# Patient Record
Sex: Female | Born: 1965 | Race: Black or African American | Hispanic: No | Marital: Single | Smoking: Never smoker
Health system: Southern US, Community
[De-identification: ages and names within clinical notes are randomized; demographics above are authoritative.]

## PROBLEM LIST (undated history)

## (undated) DIAGNOSIS — I10 Essential (primary) hypertension: Secondary | ICD-10-CM

## (undated) DIAGNOSIS — D860 Sarcoidosis of lung: Secondary | ICD-10-CM

## (undated) DIAGNOSIS — R05 Cough: Secondary | ICD-10-CM

## (undated) DIAGNOSIS — E785 Hyperlipidemia, unspecified: Secondary | ICD-10-CM

## (undated) HISTORY — PX: TUBAL LIGATION: SHX77

---
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
## 1997-10-16 ENCOUNTER — Encounter: Admission: RE | Admit: 1997-10-16 | Discharge: 1997-10-16 | Payer: Self-pay | Admitting: Family Medicine

## 1997-11-05 ENCOUNTER — Encounter: Admission: RE | Admit: 1997-11-05 | Discharge: 1997-11-05 | Payer: Self-pay | Admitting: Family Medicine

## 1997-12-01 ENCOUNTER — Encounter: Admission: RE | Admit: 1997-12-01 | Discharge: 1997-12-01 | Payer: Self-pay | Admitting: Family Medicine

## 1997-12-24 ENCOUNTER — Encounter: Admission: RE | Admit: 1997-12-24 | Discharge: 1997-12-24 | Payer: Self-pay | Admitting: Family Medicine

## 1998-01-14 ENCOUNTER — Encounter: Admission: RE | Admit: 1998-01-14 | Discharge: 1998-01-14 | Payer: Self-pay | Admitting: Family Medicine

## 1998-02-25 ENCOUNTER — Encounter: Admission: RE | Admit: 1998-02-25 | Discharge: 1998-02-25 | Payer: Self-pay | Admitting: Family Medicine

## 1998-03-25 ENCOUNTER — Encounter: Admission: RE | Admit: 1998-03-25 | Discharge: 1998-03-25 | Payer: Self-pay | Admitting: Family Medicine

## 1998-08-10 ENCOUNTER — Emergency Department (HOSPITAL_COMMUNITY): Admission: EM | Admit: 1998-08-10 | Discharge: 1998-08-10 | Payer: Self-pay | Admitting: Emergency Medicine

## 1998-08-10 ENCOUNTER — Encounter: Payer: Self-pay | Admitting: Emergency Medicine

## 1998-08-13 ENCOUNTER — Encounter: Admission: RE | Admit: 1998-08-13 | Discharge: 1998-08-13 | Payer: Self-pay | Admitting: Family Medicine

## 1998-11-18 ENCOUNTER — Encounter: Admission: RE | Admit: 1998-11-18 | Discharge: 1998-11-18 | Payer: Self-pay | Admitting: Family Medicine

## 1998-11-19 ENCOUNTER — Encounter: Admission: RE | Admit: 1998-11-19 | Discharge: 1998-11-19 | Payer: Self-pay | Admitting: Family Medicine

## 1998-11-23 ENCOUNTER — Encounter: Admission: RE | Admit: 1998-11-23 | Discharge: 1999-02-21 | Payer: Self-pay | Admitting: Family Medicine

## 1999-10-08 ENCOUNTER — Encounter: Admission: RE | Admit: 1999-10-08 | Discharge: 1999-10-08 | Payer: Self-pay | Admitting: Family Medicine

## 1999-10-25 ENCOUNTER — Encounter: Admission: RE | Admit: 1999-10-25 | Discharge: 1999-10-25 | Payer: Self-pay | Admitting: Family Medicine

## 1999-10-26 ENCOUNTER — Encounter: Admission: RE | Admit: 1999-10-26 | Discharge: 1999-10-26 | Payer: Self-pay | Admitting: Sports Medicine

## 2000-03-27 ENCOUNTER — Encounter: Admission: RE | Admit: 2000-03-27 | Discharge: 2000-03-27 | Payer: Self-pay | Admitting: Family Medicine

## 2000-04-07 ENCOUNTER — Encounter: Admission: RE | Admit: 2000-04-07 | Discharge: 2000-04-07 | Payer: Self-pay | Admitting: Family Medicine

## 2000-09-15 ENCOUNTER — Encounter: Admission: RE | Admit: 2000-09-15 | Discharge: 2000-09-15 | Payer: Self-pay | Admitting: Family Medicine

## 2000-12-26 ENCOUNTER — Encounter: Admission: RE | Admit: 2000-12-26 | Discharge: 2000-12-26 | Payer: Self-pay | Admitting: Family Medicine

## 2000-12-27 ENCOUNTER — Encounter: Admission: RE | Admit: 2000-12-27 | Discharge: 2000-12-27 | Payer: Self-pay | Admitting: Family Medicine

## 2001-07-13 ENCOUNTER — Encounter: Admission: RE | Admit: 2001-07-13 | Discharge: 2001-07-13 | Payer: Self-pay | Admitting: Family Medicine

## 2001-08-01 ENCOUNTER — Encounter: Admission: RE | Admit: 2001-08-01 | Discharge: 2001-08-01 | Payer: Self-pay | Admitting: Family Medicine

## 2001-08-01 ENCOUNTER — Encounter (INDEPENDENT_AMBULATORY_CARE_PROVIDER_SITE_OTHER): Payer: Self-pay | Admitting: *Deleted

## 2001-11-02 ENCOUNTER — Encounter: Admission: RE | Admit: 2001-11-02 | Discharge: 2001-11-02 | Payer: Self-pay | Admitting: Family Medicine

## 2001-12-04 ENCOUNTER — Emergency Department (HOSPITAL_COMMUNITY): Admission: EM | Admit: 2001-12-04 | Discharge: 2001-12-04 | Payer: Self-pay | Admitting: Emergency Medicine

## 2002-03-12 ENCOUNTER — Encounter: Admission: RE | Admit: 2002-03-12 | Discharge: 2002-03-12 | Payer: Self-pay | Admitting: Family Medicine

## 2002-10-10 ENCOUNTER — Encounter: Admission: RE | Admit: 2002-10-10 | Discharge: 2002-10-10 | Payer: Self-pay | Admitting: Family Medicine

## 2003-03-07 ENCOUNTER — Emergency Department (HOSPITAL_COMMUNITY): Admission: EM | Admit: 2003-03-07 | Discharge: 2003-03-07 | Payer: Self-pay

## 2003-03-09 ENCOUNTER — Emergency Department (HOSPITAL_COMMUNITY): Admission: EM | Admit: 2003-03-09 | Discharge: 2003-03-09 | Payer: Self-pay | Admitting: Emergency Medicine

## 2003-03-09 ENCOUNTER — Encounter: Payer: Self-pay | Admitting: Emergency Medicine

## 2003-04-09 ENCOUNTER — Encounter: Admission: RE | Admit: 2003-04-09 | Discharge: 2003-04-09 | Payer: Self-pay | Admitting: Family Medicine

## 2003-04-11 ENCOUNTER — Encounter: Admission: RE | Admit: 2003-04-11 | Discharge: 2003-04-11 | Payer: Self-pay | Admitting: Sports Medicine

## 2003-09-02 ENCOUNTER — Encounter (INDEPENDENT_AMBULATORY_CARE_PROVIDER_SITE_OTHER): Payer: Self-pay | Admitting: *Deleted

## 2003-09-02 LAB — CONVERTED CEMR LAB

## 2003-09-11 ENCOUNTER — Ambulatory Visit (HOSPITAL_COMMUNITY): Admission: RE | Admit: 2003-09-11 | Discharge: 2003-09-11 | Payer: Self-pay | Admitting: Chiropractic Medicine

## 2003-09-23 ENCOUNTER — Encounter: Admission: RE | Admit: 2003-09-23 | Discharge: 2003-09-23 | Payer: Self-pay | Admitting: Sports Medicine

## 2003-09-24 ENCOUNTER — Encounter: Admission: RE | Admit: 2003-09-24 | Discharge: 2003-09-24 | Payer: Self-pay | Admitting: Family Medicine

## 2004-04-17 ENCOUNTER — Emergency Department (HOSPITAL_COMMUNITY): Admission: EM | Admit: 2004-04-17 | Discharge: 2004-04-17 | Payer: Self-pay | Admitting: Family Medicine

## 2004-07-02 ENCOUNTER — Ambulatory Visit: Payer: Self-pay | Admitting: Family Medicine

## 2005-02-22 ENCOUNTER — Ambulatory Visit: Payer: Self-pay | Admitting: Sports Medicine

## 2005-06-22 ENCOUNTER — Ambulatory Visit: Payer: Self-pay | Admitting: Family Medicine

## 2005-06-22 ENCOUNTER — Encounter (INDEPENDENT_AMBULATORY_CARE_PROVIDER_SITE_OTHER): Payer: Self-pay | Admitting: *Deleted

## 2006-08-31 DIAGNOSIS — E781 Pure hyperglyceridemia: Secondary | ICD-10-CM | POA: Insufficient documentation

## 2006-08-31 DIAGNOSIS — I152 Hypertension secondary to endocrine disorders: Secondary | ICD-10-CM | POA: Insufficient documentation

## 2006-08-31 DIAGNOSIS — I10 Essential (primary) hypertension: Secondary | ICD-10-CM

## 2006-08-31 DIAGNOSIS — E1159 Type 2 diabetes mellitus with other circulatory complications: Secondary | ICD-10-CM

## 2006-08-31 DIAGNOSIS — Z862 Personal history of diseases of the blood and blood-forming organs and certain disorders involving the immune mechanism: Secondary | ICD-10-CM | POA: Insufficient documentation

## 2006-09-01 ENCOUNTER — Encounter (INDEPENDENT_AMBULATORY_CARE_PROVIDER_SITE_OTHER): Payer: Self-pay | Admitting: *Deleted

## 2006-12-01 ENCOUNTER — Encounter: Payer: Self-pay | Admitting: Family Medicine

## 2006-12-01 ENCOUNTER — Ambulatory Visit: Payer: Self-pay | Admitting: Family Medicine

## 2006-12-01 DIAGNOSIS — R7989 Other specified abnormal findings of blood chemistry: Secondary | ICD-10-CM | POA: Insufficient documentation

## 2006-12-01 DIAGNOSIS — E1169 Type 2 diabetes mellitus with other specified complication: Secondary | ICD-10-CM | POA: Insufficient documentation

## 2006-12-01 DIAGNOSIS — E785 Hyperlipidemia, unspecified: Secondary | ICD-10-CM

## 2006-12-01 LAB — CONVERTED CEMR LAB: Chlamydia, DNA Probe: NEGATIVE

## 2006-12-03 ENCOUNTER — Emergency Department (HOSPITAL_COMMUNITY): Admission: EM | Admit: 2006-12-03 | Discharge: 2006-12-03 | Payer: Self-pay | Admitting: Family Medicine

## 2006-12-04 ENCOUNTER — Encounter: Payer: Self-pay | Admitting: Family Medicine

## 2006-12-04 ENCOUNTER — Ambulatory Visit: Payer: Self-pay | Admitting: *Deleted

## 2006-12-04 LAB — CONVERTED CEMR LAB
ALT: 15 units/L (ref 0–35)
AST: 13 units/L (ref 0–37)
Alkaline Phosphatase: 41 units/L (ref 39–117)
BUN: 13 mg/dL (ref 6–23)
Calcium: 9.8 mg/dL (ref 8.4–10.5)
Glucose, Bld: 112 mg/dL — ABNORMAL HIGH (ref 70–99)
LDL Cholesterol: 115 mg/dL — ABNORMAL HIGH (ref 0–99)
Potassium: 3.8 meq/L (ref 3.5–5.3)
Total CHOL/HDL Ratio: 5.4
Total Protein: 7.8 g/dL (ref 6.0–8.3)

## 2006-12-14 ENCOUNTER — Encounter: Payer: Self-pay | Admitting: Family Medicine

## 2006-12-21 ENCOUNTER — Ambulatory Visit: Payer: Self-pay | Admitting: Family Medicine

## 2007-01-10 ENCOUNTER — Encounter: Payer: Self-pay | Admitting: Family Medicine

## 2007-01-10 ENCOUNTER — Ambulatory Visit: Payer: Self-pay | Admitting: Family Medicine

## 2007-01-10 LAB — CONVERTED CEMR LAB
Cholesterol: 159 mg/dL (ref 0–200)
LDL Cholesterol: 86 mg/dL (ref 0–99)
Total CHOL/HDL Ratio: 4.1

## 2007-02-26 ENCOUNTER — Telehealth: Payer: Self-pay | Admitting: *Deleted

## 2007-02-27 ENCOUNTER — Ambulatory Visit: Payer: Self-pay | Admitting: Family Medicine

## 2007-02-27 DIAGNOSIS — H6120 Impacted cerumen, unspecified ear: Secondary | ICD-10-CM | POA: Insufficient documentation

## 2007-03-05 ENCOUNTER — Emergency Department (HOSPITAL_COMMUNITY): Admission: EM | Admit: 2007-03-05 | Discharge: 2007-03-05 | Payer: Self-pay | Admitting: Emergency Medicine

## 2007-04-21 ENCOUNTER — Emergency Department (HOSPITAL_COMMUNITY): Admission: EM | Admit: 2007-04-21 | Discharge: 2007-04-21 | Payer: Self-pay | Admitting: Emergency Medicine

## 2007-06-22 ENCOUNTER — Telehealth (INDEPENDENT_AMBULATORY_CARE_PROVIDER_SITE_OTHER): Payer: Self-pay | Admitting: *Deleted

## 2007-06-22 ENCOUNTER — Emergency Department (HOSPITAL_COMMUNITY): Admission: EM | Admit: 2007-06-22 | Discharge: 2007-06-23 | Payer: Self-pay | Admitting: *Deleted

## 2007-06-22 ENCOUNTER — Emergency Department (HOSPITAL_COMMUNITY): Admission: EM | Admit: 2007-06-22 | Discharge: 2007-06-22 | Payer: Self-pay | Admitting: Emergency Medicine

## 2007-06-25 ENCOUNTER — Ambulatory Visit: Payer: Self-pay | Admitting: Family Medicine

## 2007-06-25 ENCOUNTER — Encounter (INDEPENDENT_AMBULATORY_CARE_PROVIDER_SITE_OTHER): Payer: Self-pay | Admitting: *Deleted

## 2007-06-25 DIAGNOSIS — M62838 Other muscle spasm: Secondary | ICD-10-CM | POA: Insufficient documentation

## 2007-06-26 ENCOUNTER — Telehealth: Payer: Self-pay | Admitting: *Deleted

## 2007-07-02 ENCOUNTER — Telehealth: Payer: Self-pay | Admitting: Family Medicine

## 2007-07-10 ENCOUNTER — Ambulatory Visit: Payer: Self-pay | Admitting: Family Medicine

## 2007-07-10 DIAGNOSIS — IMO0002 Reserved for concepts with insufficient information to code with codable children: Secondary | ICD-10-CM | POA: Insufficient documentation

## 2007-07-17 ENCOUNTER — Telehealth: Payer: Self-pay | Admitting: Family Medicine

## 2007-07-19 ENCOUNTER — Telehealth: Payer: Self-pay | Admitting: Family Medicine

## 2007-08-10 ENCOUNTER — Ambulatory Visit: Payer: Self-pay | Admitting: Family Medicine

## 2007-08-10 DIAGNOSIS — G9009 Other idiopathic peripheral autonomic neuropathy: Secondary | ICD-10-CM | POA: Insufficient documentation

## 2007-10-04 ENCOUNTER — Encounter: Admission: RE | Admit: 2007-10-04 | Discharge: 2007-10-04 | Payer: Self-pay | Admitting: Internal Medicine

## 2008-10-20 ENCOUNTER — Emergency Department (HOSPITAL_COMMUNITY): Admission: EM | Admit: 2008-10-20 | Discharge: 2008-10-21 | Payer: Self-pay | Admitting: Emergency Medicine

## 2008-12-26 ENCOUNTER — Emergency Department (HOSPITAL_COMMUNITY): Admission: EM | Admit: 2008-12-26 | Discharge: 2008-12-26 | Payer: Self-pay | Admitting: Emergency Medicine

## 2008-12-30 ENCOUNTER — Emergency Department (HOSPITAL_COMMUNITY): Admission: EM | Admit: 2008-12-30 | Discharge: 2008-12-30 | Payer: Self-pay | Admitting: Emergency Medicine

## 2009-05-16 ENCOUNTER — Emergency Department (HOSPITAL_COMMUNITY): Admission: EM | Admit: 2009-05-16 | Discharge: 2009-05-16 | Payer: Self-pay | Admitting: Emergency Medicine

## 2009-09-11 ENCOUNTER — Emergency Department (HOSPITAL_COMMUNITY): Admission: EM | Admit: 2009-09-11 | Discharge: 2009-09-11 | Payer: Self-pay | Admitting: Emergency Medicine

## 2010-02-15 ENCOUNTER — Emergency Department (HOSPITAL_COMMUNITY): Admission: EM | Admit: 2010-02-15 | Discharge: 2010-02-15 | Payer: Self-pay | Admitting: Emergency Medicine

## 2010-03-02 ENCOUNTER — Emergency Department (HOSPITAL_COMMUNITY): Admission: EM | Admit: 2010-03-02 | Discharge: 2010-03-02 | Payer: Self-pay | Admitting: Family Medicine

## 2010-05-13 ENCOUNTER — Emergency Department (HOSPITAL_COMMUNITY): Admission: EM | Admit: 2010-05-13 | Discharge: 2010-05-13 | Payer: Self-pay | Admitting: Family Medicine

## 2010-05-28 ENCOUNTER — Emergency Department (HOSPITAL_COMMUNITY): Admission: EM | Admit: 2010-05-28 | Discharge: 2010-05-28 | Payer: Self-pay | Admitting: Family Medicine

## 2010-06-02 ENCOUNTER — Emergency Department (HOSPITAL_COMMUNITY): Admission: EM | Admit: 2010-06-02 | Discharge: 2010-06-02 | Payer: Self-pay | Admitting: Family Medicine

## 2010-07-20 ENCOUNTER — Emergency Department (HOSPITAL_COMMUNITY)
Admission: EM | Admit: 2010-07-20 | Discharge: 2010-07-20 | Payer: Self-pay | Source: Home / Self Care | Admitting: Family Medicine

## 2010-07-27 ENCOUNTER — Emergency Department (HOSPITAL_COMMUNITY)
Admission: EM | Admit: 2010-07-27 | Discharge: 2010-07-27 | Payer: Self-pay | Source: Home / Self Care | Admitting: Emergency Medicine

## 2010-08-18 ENCOUNTER — Inpatient Hospital Stay (INDEPENDENT_AMBULATORY_CARE_PROVIDER_SITE_OTHER)
Admission: RE | Admit: 2010-08-18 | Discharge: 2010-08-18 | Disposition: A | Discharge status: Home / Self Care | Payer: 59 | Source: Ambulatory Visit | Attending: Emergency Medicine | Admitting: Emergency Medicine

## 2010-08-18 DIAGNOSIS — J069 Acute upper respiratory infection, unspecified: Secondary | ICD-10-CM

## 2010-08-18 DIAGNOSIS — J029 Acute pharyngitis, unspecified: Secondary | ICD-10-CM

## 2010-09-14 LAB — POCT URINALYSIS DIPSTICK
Bilirubin Urine: NEGATIVE
Glucose, UA: NEGATIVE mg/dL
Hgb urine dipstick: NEGATIVE
Protein, ur: NEGATIVE mg/dL
Specific Gravity, Urine: 1.015 (ref 1.005–1.030)

## 2010-10-06 LAB — URINALYSIS, ROUTINE W REFLEX MICROSCOPIC
Nitrite: NEGATIVE
Protein, ur: 30 mg/dL — AB
Specific Gravity, Urine: 1.021 (ref 1.005–1.030)

## 2010-10-06 LAB — POCT I-STAT, CHEM 8
Calcium, Ion: 1.12 mmol/L (ref 1.12–1.32)
Chloride: 103 mEq/L (ref 96–112)
Creatinine, Ser: 0.8 mg/dL (ref 0.4–1.2)
Glucose, Bld: 158 mg/dL — ABNORMAL HIGH (ref 70–99)
Hemoglobin: 16 g/dL — ABNORMAL HIGH (ref 12.0–15.0)
Potassium: 3.8 mEq/L (ref 3.5–5.1)

## 2010-10-06 LAB — POCT CARDIAC MARKERS: Troponin i, poc: <0.05 ng/mL (ref 0.00–0.09)

## 2010-10-13 LAB — DIFFERENTIAL
Basophils Absolute: 0 10*3/uL (ref 0.0–0.1)
Basophils Relative: 1 % (ref 0–1)
Monocytes Relative: 9 % (ref 3–12)
Neutro Abs: 2.9 10*3/uL (ref 1.7–7.7)
Neutrophils Relative %: 58 % (ref 43–77)

## 2010-10-13 LAB — URINE MICROSCOPIC-ADD ON

## 2010-10-13 LAB — URINALYSIS, ROUTINE W REFLEX MICROSCOPIC
Glucose, UA: NEGATIVE mg/dL
Leukocytes, UA: NEGATIVE
Nitrite: NEGATIVE
pH: 5.5 (ref 5.0–8.0)

## 2010-10-13 LAB — CBC
HCT: 39 % (ref 36.0–46.0)
MCV: 82.8 fL (ref 78.0–100.0)

## 2011-01-24 ENCOUNTER — Other Ambulatory Visit: Payer: Self-pay | Admitting: Family Medicine

## 2011-01-24 ENCOUNTER — Encounter: Payer: Self-pay | Admitting: Family Medicine

## 2011-01-24 ENCOUNTER — Ambulatory Visit (INDEPENDENT_AMBULATORY_CARE_PROVIDER_SITE_OTHER): Payer: 59 | Admitting: Family Medicine

## 2011-01-24 VITALS — BP 124/82 | HR 90 | Ht 63.5 in | Wt 166.0 lb

## 2011-01-24 DIAGNOSIS — D869 Sarcoidosis, unspecified: Secondary | ICD-10-CM

## 2011-01-24 DIAGNOSIS — Z23 Encounter for immunization: Secondary | ICD-10-CM

## 2011-01-24 DIAGNOSIS — E669 Obesity, unspecified: Secondary | ICD-10-CM

## 2011-01-24 DIAGNOSIS — E785 Hyperlipidemia, unspecified: Secondary | ICD-10-CM

## 2011-01-24 DIAGNOSIS — Z0000 Encounter for general adult medical examination without abnormal findings: Secondary | ICD-10-CM

## 2011-01-24 LAB — COMPREHENSIVE METABOLIC PANEL
ALT: 10 U/L (ref 0–35)
Creat: 0.72 mg/dL (ref 0.50–1.10)
Sodium: 136 mEq/L (ref 135–145)
Total Protein: 7.4 g/dL (ref 6.0–8.3)

## 2011-01-24 LAB — CBC WITH DIFFERENTIAL/PLATELET
HCT: 39.5 % (ref 36.0–46.0)
Hemoglobin: 13.1 g/dL (ref 12.0–15.0)
Lymphocytes Relative: 38 % (ref 12–46)
Monocytes Relative: 8 % (ref 3–12)
Neutro Abs: 2.3 10*3/uL (ref 1.7–7.7)
Neutrophils Relative %: 51 % (ref 43–77)
RDW: 15.1 % (ref 11.5–15.5)

## 2011-01-24 LAB — POCT URINALYSIS DIPSTICK
Bilirubin, UA: NEGATIVE
Glucose, UA: NEGATIVE
Ketones, UA: NEGATIVE
Leukocytes, UA: NEGATIVE
Spec Grav, UA: 1.015
Urobilinogen, UA: NEGATIVE

## 2011-01-24 LAB — LIPID PANEL
LDL Cholesterol: 131 mg/dL — ABNORMAL HIGH (ref 0–99)
Total CHOL/HDL Ratio: 4.8 Ratio
Triglycerides: 148 mg/dL (ref <150)

## 2011-01-24 NOTE — Patient Instructions (Signed)
Take 800 mg of ibuprofen 3 times a day as needed for your back pain which our periods

## 2011-01-24 NOTE — Progress Notes (Signed)
  Subjective:    Patient ID: Cheryl Carroll, female    DOB: July 30, 1965, 45 y.o.   MRN: [id number]  HPI She is here for a complete examination. In the past she had been placed on blood pressure meds but is presently not on him. She also was given Lipitor for cholesterol and again due to cost is not on them. She has not had a mammogram. She has a history of sarcoidosis. She also complains of low back pain especially around the time of her menses. She has a history of cervical disc disease.   Review of Systems Negative except as above    Objective:   Physical Exam BP 124/82  Pulse 90  Ht 5' 3.5" (1.613 m)  Wt 166 lb (75.297 kg)  BMI 28.94 kg/m2  LMP 01/17/2011  General Appearance:    Alert, cooperative, no distress, appears stated age  Head:    Normocephalic, without obvious abnormality, atraumatic  Eyes:    PERRL, conjunctiva/corneas clear, EOM's intact, fundi    benign  Ears:    Normal TM's and external ear canals  Nose:   Nares normal, mucosa normal, no drainage or sinus   tenderness  Throat:   Lips, mucosa, and tongue normal; teeth and gums normal  Neck:   Supple, no lymphadenopathy;  thyroid:  no   enlargement/tenderness/nodules; no carotid   bruit or JVD  Back:    Spine nontender, no curvature, ROM normal, no CVA     tenderness  Lungs:     Clear to auscultation bilaterally without wheezes, rales or     ronchi; respirations unlabored  Chest Wall:    No tenderness or deformity   Heart:    Regular rate and rhythm, S1 and S2 normal, no murmur, rub   or gallop  Breast Exam:    No tenderness, masses, or nipple discharge or inversion.      No axillary lymphadenopathy  Abdomen:     Soft, non-tender, nondistended, normoactive bowel sounds,    no masses, no hepatosplenomegaly  Genitalia:    Normal external genitalia without lesions.  BUS and vagina normal; cervix without lesions, or cervical motion tenderness. No abnormal vaginal discharge.  Uterus and adnexa not enlarged, nontender, no  masses.  Pap performed  Rectal:    Normal tone, no masses or tenderness; guaiac negative stool  Extremities:   No clubbing, cyanosis or edema  Pulses:   2+ and symmetric all extremities  Skin:   Skin color, texture, turgor normal, no rashes or lesions  Lymph nodes:   Cervical, supraclavicular, and axillary nodes normal  Neurologic:   CNII-XII intact, normal strength, sensation and gait; reflexes 2+ and symmetric throughout          Psych:   Normal mood, affect, hygiene and grooming.          Assessment & Plan:  Menses related back pain. History of hypertension. Hyperlipidemia. Obesity. I will do routine blood screening and probably place her on proper call. Recommend Advil for her back pain with her menses. Immunization update done. Mammogram ordered.

## 2011-01-25 ENCOUNTER — Other Ambulatory Visit (HOSPITAL_COMMUNITY)
Admission: RE | Admit: 2011-01-25 | Discharge: 2011-01-25 | Disposition: A | Discharge status: Home / Self Care | Payer: 59 | Source: Ambulatory Visit | Attending: Family Medicine | Admitting: Family Medicine

## 2011-01-25 DIAGNOSIS — Z01419 Encounter for gynecological examination (general) (routine) without abnormal findings: Secondary | ICD-10-CM | POA: Insufficient documentation

## 2011-01-25 NOTE — Progress Notes (Signed)
Addended by: Lavell Islam on: 01/25/2011 02:36 PM   Modules accepted: Orders

## 2011-01-26 ENCOUNTER — Telehealth: Payer: Self-pay

## 2011-01-26 NOTE — Telephone Encounter (Signed)
Called and left message for pt to call back.

## 2011-01-27 ENCOUNTER — Ambulatory Visit (INDEPENDENT_AMBULATORY_CARE_PROVIDER_SITE_OTHER): Payer: 59 | Admitting: Family Medicine

## 2011-01-27 ENCOUNTER — Telehealth: Payer: Self-pay

## 2011-01-27 DIAGNOSIS — E1165 Type 2 diabetes mellitus with hyperglycemia: Secondary | ICD-10-CM | POA: Insufficient documentation

## 2011-01-27 DIAGNOSIS — E119 Type 2 diabetes mellitus without complications: Secondary | ICD-10-CM | POA: Insufficient documentation

## 2011-01-27 DIAGNOSIS — E785 Hyperlipidemia, unspecified: Secondary | ICD-10-CM

## 2011-01-27 MED ORDER — PRAVASTATIN SODIUM 20 MG PO TABS: 20.0000 mg | ORAL_TABLET | Freq: Every evening | ORAL | Status: DC

## 2011-01-27 NOTE — Progress Notes (Signed)
  Subjective:    Patient ID: Cheryl Carroll, female    DOB: 11/29/1965, 45 y.o.   MRN: [id number]  HPI She is here to discuss recent blood work which did show elevated blood sugar and subsequently an A1c of 7.1. She does have a family history of diabetes.   Review of Systems     Objective:   Physical Exam Alert and in no distress otherwise not examined       Assessment & Plan:  New onset diabetes.  Diabetes was discussed with her in regard to risk for stroke, blindness, heart disease, renal problems as well as neurologic difficulties. Explained the need for her to make diet and exercise changes. Discussed getting her dress size down to approximately 6 . She was given instructions on blood sugar testing. Recheck here in one month. I will also place her on Pravachol.

## 2011-01-27 NOTE — Telephone Encounter (Signed)
`  pt said ok to leave message told her to please make appt to discuss labs sugars

## 2011-01-31 ENCOUNTER — Telehealth: Payer: Self-pay

## 2011-01-31 NOTE — Telephone Encounter (Signed)
Called pt to informed of normal pap

## 2011-04-08 LAB — POCT CARDIAC MARKERS
CKMB, poc: 4
Myoglobin, poc: 129
Operator id: 234501
Operator id: 285841
Troponin i, poc: <0.05
Troponin i, poc: <0.05

## 2011-04-08 LAB — I-STAT 8, (EC8 V) (CONVERTED LAB)
BUN: 7
Bicarbonate: 27.7 — ABNORMAL HIGH
Glucose, Bld: 138 — ABNORMAL HIGH
Hemoglobin: 15.6 — ABNORMAL HIGH
Sodium: 137
pH, Ven: 7.374 — ABNORMAL HIGH

## 2011-04-08 LAB — POCT I-STAT CREATININE: Operator id: 234501

## 2011-04-15 LAB — POCT PREGNANCY, URINE: Operator id: 235561

## 2011-04-15 LAB — POCT URINALYSIS DIP (DEVICE)
Bilirubin Urine: NEGATIVE
Glucose, UA: NEGATIVE
Ketones, ur: NEGATIVE
Nitrite: NEGATIVE

## 2011-05-31 ENCOUNTER — Emergency Department (INDEPENDENT_AMBULATORY_CARE_PROVIDER_SITE_OTHER)
Admission: EM | Admit: 2011-05-31 | Discharge: 2011-05-31 | Disposition: A | Discharge status: Home / Self Care | Payer: Self-pay | Source: Home / Self Care | Attending: Emergency Medicine | Admitting: Emergency Medicine

## 2011-05-31 ENCOUNTER — Encounter (HOSPITAL_COMMUNITY): Payer: Self-pay | Admitting: *Deleted

## 2011-05-31 DIAGNOSIS — R6889 Other general symptoms and signs: Secondary | ICD-10-CM

## 2011-05-31 HISTORY — DX: Essential (primary) hypertension: I10

## 2011-05-31 HISTORY — DX: Hyperlipidemia, unspecified: E78.5

## 2011-05-31 MED ORDER — FLUTICASONE PROPIONATE 50 MCG/ACT NA SUSP: 2.0000 | Freq: Every day | NASAL | Status: DC

## 2011-05-31 MED ORDER — IBUPROFEN 600 MG PO TABS: 600.0000 mg | ORAL_TABLET | Freq: Four times a day (QID) | ORAL | Status: AC | PRN

## 2011-05-31 MED ORDER — ACETAMINOPHEN 325 MG PO TABS
ORAL_TABLET | ORAL | Status: AC
  Filled 2011-05-31: qty 2

## 2011-05-31 MED ORDER — ACETAMINOPHEN 325 MG PO TABS
650.0000 mg | ORAL_TABLET | Freq: Four times a day (QID) | ORAL | Status: DC | PRN
  Administered 2011-05-31: 650 mg via ORAL

## 2011-05-31 MED ORDER — PSEUDOEPHEDRINE-GUAIFENESIN ER 120-1200 MG PO TB12: 1.0000 | ORAL_TABLET | Freq: Two times a day (BID) | ORAL | Status: DC | PRN

## 2011-05-31 NOTE — ED Provider Notes (Signed)
History     CSN: [id number] Arrival date & time: 05/31/2011  3:46 PM   First MD Initiated Contact with Patient 05/31/11 1536      Chief Complaint  Patient presents with  . URI    HPI Comments: Pt with nasal congestion, postnasal drip, bodyaches, generalized gradual onset HA when febrile, nonproductive cough x 2 days. Taking robitussin for cough with relief. Tried alka seltzer and motrin 400 mg for bodyaches w/o relief.  No sinus pressure/pain, N/V, photophobia, ear pain, neck stiffness, ST, wheeze, SOB, CP, abd pain, diarrhea, rash, urinary c/o. Did not get flu shot this year.   Patient is a 45 y.o. female presenting with URI. The history is provided by the patient.  URI The primary symptoms include fever, fatigue, headaches, cough and myalgias. Primary symptoms do not include ear pain, sore throat, swollen glands, wheezing, abdominal pain, nausea, vomiting or rash.  The headache is not associated with neck stiffness.  Symptoms associated with the illness include congestion and rhinorrhea. The illness is not associated with sinus pressure.    Past Medical History  Diagnosis Date  . Hyperlipidemia   . Diabetes mellitus   . Hypertension     Past Surgical History  Procedure Date  . Tubal ligation     Family History  Problem Relation Age of Onset  . Diabetes Mother   . Hyperlipidemia Mother   . Hypertension Mother   . Hyperlipidemia Father   . Hypertension Father   . Diabetes Father     History  Substance Use Topics  . Smoking status: Never Smoker   . Smokeless tobacco: Never Used  . Alcohol Use: No    OB History    Grav Para Term Preterm Abortions TAB SAB Ect Mult Living                  Review of Systems  Constitutional: Positive for fever and fatigue.  HENT: Positive for congestion and rhinorrhea. Negative for ear pain, sore throat, sneezing, neck pain, neck stiffness and sinus pressure.   Respiratory: Positive for cough. Negative for wheezing.     Cardiovascular: Negative for chest pain.  Gastrointestinal: Negative for nausea, vomiting and abdominal pain.  Genitourinary: Negative.   Musculoskeletal: Positive for myalgias.  Skin: Negative for rash.  Neurological: Positive for headaches.    Allergies  Review of patient's allergies indicates no known allergies.  Home Medications   Current Outpatient Rx  Name Route Sig Dispense Refill  . DEXTROMETHORPHAN HBR 15 MG/5ML PO SYRP Oral Take 10 mLs by mouth 4 (four) times daily as needed.      Marland Kitchen FLUTICASONE PROPIONATE 50 MCG/ACT NA SUSP Nasal Place 2 sprays into the nose daily. 16 g 2  . IBUPROFEN 600 MG PO TABS Oral Take 1 tablet (600 mg total) by mouth every 6 (six) hours as needed for pain. 30 tablet 0  . PRAVASTATIN SODIUM 20 MG PO TABS Oral Take 1 tablet (20 mg total) by mouth every evening. 90 tablet 1  . PSEUDOEPHEDRINE-GUAIFENESIN [phone] MG PO TB12 Oral Take 1 tablet by mouth 2 (two) times daily as needed (congestion). 20 each 0    BP 128/82  Pulse 118  Temp(Src) 99.4 F (37.4 C) (Oral)  Resp 18  SpO2 100%  LMP 05/22/2011  Physical Exam  Nursing note and vitals reviewed. Constitutional: She is oriented to person, place, and time. She appears well-developed and well-nourished. No distress.  HENT:  Head: Normocephalic and atraumatic.  Nose: Rhinorrhea present. Right sinus  exhibits no maxillary sinus tenderness and no frontal sinus tenderness. Left sinus exhibits no frontal sinus tenderness.  Mouth/Throat: Uvula is midline, oropharynx is clear and moist and mucous membranes are normal.  Eyes: Conjunctivae and EOM are normal. Pupils are equal, round, and reactive to light.  Neck: Normal range of motion. Neck supple.  Cardiovascular: Regular rhythm and normal heart sounds.  Exam reveals no gallop and no friction rub.   No murmur heard. Pulmonary/Chest: Effort normal and breath sounds normal. She has no wheezes.  Abdominal: Soft. Bowel sounds are normal. She exhibits no  distension and no mass. There is no tenderness. There is no rebound and no guarding.  Musculoskeletal: Normal range of motion.  Lymphadenopathy:    She has no cervical adenopathy.  Neurological: She is alert and oriented to person, place, and time.  Skin: Skin is warm and dry.  Psychiatric: She has a normal mood and affect. Her behavior is normal. Judgment and thought content normal.    ED Course  Procedures (including critical care time)  Labs Reviewed - No data to display No results found.   1. Flu-like symptoms       MDM  Pt appears to be in NAD. VSS. Pt non-toxic appearing. No evidence of pharyngitis or OM. No evidence of neck stiffness or other sx to support meningitis. No evidence of dehydration. Abd S/NT/ND without peritoneal sx. Doubt intraabdominal process. No evidence of PNA or UTI. Pt able to tolerate PO. Pt with viral syndrome, probable flu. Will treat symptomatically and have pt f/u with PCP PRN   Luiz Blare, MD 05/31/11 1819

## 2011-05-31 NOTE — ED Notes (Signed)
3 days of  Nasal congestion and productive cough, 2 days of fever

## 2011-06-04 ENCOUNTER — Encounter (HOSPITAL_COMMUNITY): Payer: Self-pay | Admitting: Emergency Medicine

## 2011-06-04 ENCOUNTER — Emergency Department (INDEPENDENT_AMBULATORY_CARE_PROVIDER_SITE_OTHER)
Admission: EM | Admit: 2011-06-04 | Discharge: 2011-06-04 | Disposition: A | Discharge status: Home / Self Care | Payer: Self-pay | Source: Home / Self Care | Attending: Family Medicine | Admitting: Family Medicine

## 2011-06-04 DIAGNOSIS — J40 Bronchitis, not specified as acute or chronic: Secondary | ICD-10-CM

## 2011-06-04 HISTORY — DX: Sarcoidosis of lung: D86.0

## 2011-06-04 MED ORDER — AZITHROMYCIN 250 MG PO TABS: 250.0000 mg | ORAL_TABLET | Freq: Every day | ORAL | Status: AC

## 2011-06-04 MED ORDER — ALBUTEROL SULFATE HFA 108 (90 BASE) MCG/ACT IN AERS: 2.0000 | INHALATION_SPRAY | RESPIRATORY_TRACT | Status: DC | PRN

## 2011-06-04 MED ORDER — ALBUTEROL SULFATE (5 MG/ML) 0.5% IN NEBU
INHALATION_SOLUTION | RESPIRATORY_TRACT | Status: AC
  Filled 2011-06-04: qty 1

## 2011-06-04 MED ORDER — GUAIFENESIN-CODEINE 100-10 MG/5ML PO SYRP: 5.0000 mL | ORAL_SOLUTION | Freq: Four times a day (QID) | ORAL | Status: AC | PRN

## 2011-06-04 MED ORDER — ALBUTEROL SULFATE (5 MG/ML) 0.5% IN NEBU
5.0000 mg | INHALATION_SOLUTION | Freq: Once | RESPIRATORY_TRACT | Status: AC
  Administered 2011-06-04: 5 mg via RESPIRATORY_TRACT

## 2011-06-04 MED ORDER — IPRATROPIUM BROMIDE 0.02 % IN SOLN
0.5000 mg | RESPIRATORY_TRACT | Status: DC
  Administered 2011-06-04: 0.5 mg via RESPIRATORY_TRACT

## 2011-06-04 NOTE — ED Provider Notes (Signed)
History     CSN: [id number] Arrival date & time: 06/04/2011  4:01 PM   First MD Initiated Contact with Patient 06/04/11 1530      Chief Complaint  Patient presents with  . Cough    (Consider location/radiation/quality/duration/timing/severity/associated sxs/prior treatment) HPI Comments: Cheryl Carroll presents for evaluation of persistent cough since Monday. She was seen here earlier in the week, and is just not getting better.   Patient is a 45 y.o. female presenting with cough. The history is provided by the patient.  Cough This is a new problem. The current episode started more than 2 days ago. The problem occurs constantly. The cough is non-productive. There has been no fever. Associated symptoms include rhinorrhea, shortness of breath and wheezing. She has tried decongestants for the symptoms. The treatment provided no relief. She is not a smoker.    Past Medical History  Diagnosis Date  . Hyperlipidemia   . Diabetes mellitus   . Hypertension   . Sarcoidosis of lung     Past Surgical History  Procedure Date  . Tubal ligation     Family History  Problem Relation Age of Onset  . Diabetes Mother   . Hyperlipidemia Mother   . Hypertension Mother   . Hyperlipidemia Father   . Hypertension Father   . Diabetes Father     History  Substance Use Topics  . Smoking status: Never Smoker   . Smokeless tobacco: Never Used  . Alcohol Use: No    OB History    Grav Para Term Preterm Abortions TAB SAB Ect Mult Living                  Review of Systems  Constitutional: Negative.   HENT: Positive for congestion and rhinorrhea. Negative for sinus pressure.   Eyes: Negative.   Respiratory: Positive for cough, shortness of breath and wheezing.   Cardiovascular: Negative.   Gastrointestinal: Negative.   Genitourinary: Negative.   Musculoskeletal: Negative.   Skin: Negative.   Neurological: Negative.     Allergies  Review of patient's allergies indicates no known  allergies.  Home Medications   Current Outpatient Rx  Name Route Sig Dispense Refill  . IBUPROFEN 600 MG PO TABS Oral Take 1 tablet (600 mg total) by mouth every 6 (six) hours as needed for pain. 30 tablet 0  . PSEUDOEPHEDRINE-GUAIFENESIN [phone] MG PO TB12 Oral Take 1 tablet by mouth 2 (two) times daily as needed (congestion). 20 each 0  . ALBUTEROL SULFATE HFA 108 (90 BASE) MCG/ACT IN AERS Inhalation Inhale 2 puffs into the lungs every 4 (four) hours as needed for wheezing or shortness of breath. 1 Inhaler 0  . AZITHROMYCIN 250 MG PO TABS Oral Take 1 tablet (250 mg total) by mouth daily. Take two tablets on first day, then one tablet each day for four days 6 tablet 0  . DEXTROMETHORPHAN HBR 15 MG/5ML PO SYRP Oral Take 10 mLs by mouth 4 (four) times daily as needed.      Marland Kitchen FLUTICASONE PROPIONATE 50 MCG/ACT NA SUSP Nasal Place 2 sprays into the nose daily. 16 g 2  . GUAIFENESIN-CODEINE 100-10 MG/5ML PO SYRP Oral Take 5 mLs by mouth every 6 (six) hours as needed for cough or congestion. 120 mL 0  . PRAVASTATIN SODIUM 20 MG PO TABS Oral Take 1 tablet (20 mg total) by mouth every evening. 90 tablet 1    BP 158/94  Pulse 90  Temp(Src) 98.8 F (37.1 C) (Oral)  Resp 18  SpO2 99%  LMP 05/22/2011  Physical Exam  Nursing note and vitals reviewed. Constitutional: She is oriented to person, place, and time. She appears well-developed and well-nourished.  HENT:  Head: Normocephalic and atraumatic.  Right Ear: Tympanic membrane and external ear normal.  Left Ear: Tympanic membrane and external ear normal.  Nose: Nose normal.  Mouth/Throat: Uvula is midline, oropharynx is clear and moist and mucous membranes are normal.  Eyes: EOM are normal. Pupils are equal, round, and reactive to light.  Neck: Normal range of motion.  Cardiovascular: Normal rate and regular rhythm.   Pulmonary/Chest: Effort normal. She has wheezes in the right upper field, the right lower field, the left upper field and the  left lower field.  Neurological: She is alert and oriented to person, place, and time.  Skin: Skin is warm and dry.    ED Course  Procedures (including critical care time)  Labs Reviewed - No data to display No results found.   1. Bronchitis       MDM  Diffuse wheezing; improved after duoneb        Richardo Priest, MD 06/04/11 1737

## 2011-06-04 NOTE — ED Notes (Signed)
Patient reports seen earlier this week at the urgent care.  Cough has worsened, more difficult to cough up phlegm. Patient has noticed wheezing.  Patient reports nausea with occasional vomiting.  Reports head "sinning" feeling dizziness is new today

## 2011-06-16 ENCOUNTER — Emergency Department (INDEPENDENT_AMBULATORY_CARE_PROVIDER_SITE_OTHER): Payer: 59

## 2011-06-16 ENCOUNTER — Encounter (HOSPITAL_COMMUNITY): Payer: Self-pay | Admitting: *Deleted

## 2011-06-16 ENCOUNTER — Emergency Department (HOSPITAL_COMMUNITY)
Admission: EM | Admit: 2011-06-16 | Discharge: 2011-06-16 | Disposition: A | Discharge status: Home / Self Care | Payer: Self-pay | Source: Home / Self Care | Attending: Emergency Medicine | Admitting: Emergency Medicine

## 2011-06-16 DIAGNOSIS — J40 Bronchitis, not specified as acute or chronic: Secondary | ICD-10-CM

## 2011-06-16 MED ORDER — TRAMADOL HCL 50 MG PO TABS: 100.0000 mg | ORAL_TABLET | Freq: Three times a day (TID) | ORAL | Status: AC | PRN

## 2011-06-16 MED ORDER — PREDNISONE 10 MG PO TABS: ORAL_TABLET | ORAL | Status: DC

## 2011-06-16 MED ORDER — DOXYCYCLINE HYCLATE 100 MG PO TABS: 100.0000 mg | ORAL_TABLET | Freq: Two times a day (BID) | ORAL | Status: AC

## 2011-06-16 NOTE — Discharge Instructions (Signed)
Bronchitis Bronchitis is the body's way of reacting to injury and/or infection (inflammation) of the bronchi. Bronchi are the air tubes that extend from the windpipe into the lungs. If the inflammation becomes severe, it may cause shortness of breath. CAUSES  Inflammation may be caused by:  A virus.   Germs (bacteria).   Dust.   Allergens.   Pollutants and many other irritants.  The cells lining the bronchial tree are covered with tiny hairs (cilia). These constantly beat upward, away from the lungs, toward the mouth. This keeps the lungs free of pollutants. When these cells become too irritated and are unable to do their job, mucus begins to develop. This causes the characteristic cough of bronchitis. The cough clears the lungs when the cilia are unable to do their job. Without either of these protective mechanisms, the mucus would settle in the lungs. Then you would develop pneumonia. Smoking is a common cause of bronchitis and can contribute to pneumonia. Stopping this habit is the single most important thing you can do to help yourself. TREATMENT   Your caregiver may prescribe an antibiotic if the cough is caused by bacteria. Also, medicines that open up your airways make it easier to breathe. Your caregiver may also recommend or prescribe an expectorant. It will loosen the mucus to be coughed up. Only take over-the-counter or prescription medicines for pain, discomfort, or fever as directed by your caregiver.   Removing whatever causes the problem (smoking, for example) is critical to preventing the problem from getting worse.   Cough suppressants may be prescribed for relief of cough symptoms.   Inhaled medicines may be prescribed to help with symptoms now and to help prevent problems from returning.   For those with recurrent (chronic) bronchitis, there may be a need for steroid medicines.  SEEK IMMEDIATE MEDICAL CARE IF:   During treatment, you develop more pus-like mucus  (purulent sputum).   You have a fever.   Your baby is older than 3 months with a rectal temperature of 102 F (38.9 C) or higher.   Your baby is 28 months old or younger with a rectal temperature of 100.4 F (38 C) or higher.   You become progressively more ill.   You have increased difficulty breathing, wheezing, or shortness of breath.  It is necessary to seek immediate medical care if you are elderly or sick from any other disease. MAKE SURE YOU:   Understand these instructions.   Will watch your condition.   Will get help right away if you are not doing well or get worse.  Document Released: 06/20/2005 Document Revised: 03/02/2011 Document Reviewed: 04/29/2008 [facility] Patient Information 2012 [place], [place].  Return if no better in 1 week.  If you are no better, the next step is to see a lung specialist.

## 2011-06-16 NOTE — ED Provider Notes (Signed)
History     CSN: [id number] Arrival date & time: 06/16/2011  8:03 PM   First MD Initiated Contact with Patient 06/16/11 1937      Chief Complaint  Patient presents with  . Cough    (Consider location/radiation/quality/duration/timing/severity/associated sxs/prior treatment) HPI Comments: Mrs. Hennigan has had a 2-1/2 week history of cough productive of small amounts of yellow sputum. She states she's coughing continuously all day and all night long. She had some fever at onset and notes slight nasal congestion right now she denies any postnasal drip, sore throat, or hoarseness. She has not had a fever now. She denies any wheezing, chest congestion, chest tightness, or chest pain. She has no history of asthma or allergies. She does have a history of sarcoidosis. There is no history consistent with reflux.  She was here or the 27th when this all began at that time she had a fever it was felt she had a flulike illness. She was given a cough suppressant. She was back again 4 days later on December 1 at that time she did not have a fever it was thought she had bronchitis. She was given a Z-Pak and an albuterol inhaler but still does not feel like she is making any progress.  Patient is a 45 y.o. female presenting with cough. The history is provided by the patient.  Cough This is a new problem. The current episode started more than 1 week ago. The problem occurs constantly. The problem has not changed since onset.The cough is productive of purulent sputum. There has been no fever. Pertinent negatives include no chest pain, no chills, no sweats, no weight loss, no ear congestion, no ear pain, no headaches, no rhinorrhea, no sore throat, no myalgias, no shortness of breath, no wheezing and no eye redness. She has tried cough syrup for the symptoms. The treatment provided no relief. She is not a smoker. Her past medical history does not include bronchitis, pneumonia, bronchiectasis, COPD, emphysema or  asthma.    Past Medical History  Diagnosis Date  . Hyperlipidemia   . Diabetes mellitus   . Hypertension   . Sarcoidosis of lung     Past Surgical History  Procedure Date  . Tubal ligation     Family History  Problem Relation Age of Onset  . Diabetes Mother   . Hyperlipidemia Mother   . Hypertension Mother   . Hyperlipidemia Father   . Hypertension Father   . Diabetes Father     History  Substance Use Topics  . Smoking status: Never Smoker   . Smokeless tobacco: Never Used  . Alcohol Use: No    OB History    Grav Para Term Preterm Abortions TAB SAB Ect Mult Living                  Review of Systems  Constitutional: Negative for fever, chills, weight loss and fatigue.  HENT: Negative for ear pain, congestion, sore throat, rhinorrhea, sneezing, neck stiffness, voice change and postnasal drip.   Eyes: Negative for pain, discharge and redness.  Respiratory: Positive for cough. Negative for chest tightness, shortness of breath and wheezing.   Cardiovascular: Negative for chest pain.  Gastrointestinal: Negative for nausea, vomiting, abdominal pain and diarrhea.  Musculoskeletal: Negative for myalgias.  Skin: Negative for rash.  Neurological: Negative for headaches.    Allergies  Review of patient's allergies indicates no known allergies.  Home Medications   Current Outpatient Rx  Name Route Sig Dispense Refill  .  ALBUTEROL SULFATE HFA 108 (90 BASE) MCG/ACT IN AERS Inhalation Inhale 2 puffs into the lungs every 4 (four) hours as needed for wheezing or shortness of breath. 1 Inhaler 0  . DOXYCYCLINE HYCLATE 100 MG PO TABS Oral Take 1 tablet (100 mg total) by mouth 2 (two) times daily. 20 tablet 0  . PRAVASTATIN SODIUM 20 MG PO TABS Oral Take 1 tablet (20 mg total) by mouth every evening. 90 tablet 1  . PREDNISONE 10 MG PO TABS  Take 4 tabs daily for 4 days, 3 tabs daily for 4 days, 2 tabs daily for 4 days, then 1 tab daily for 4 days.  Take all tabs at one time  with food and preferably in the morning except for the first dose. 40 tablet 0  . TRAMADOL HCL 50 MG PO TABS Oral Take 2 tablets (100 mg total) by mouth every 8 (eight) hours as needed for pain. Maximum dose= 8 tablets per day 30 tablet 0    BP 142/82  Pulse 94  Temp(Src) 99.3 F (37.4 C) (Oral)  Resp 20  SpO2 99%  LMP 05/22/2011  Physical Exam  Nursing note and vitals reviewed. Constitutional: She appears well-developed and well-nourished. No distress.  HENT:  Head: Normocephalic and atraumatic.  Right Ear: External ear normal.  Left Ear: External ear normal.  Nose: Nose normal.  Mouth/Throat: Oropharynx is clear and moist. No oropharyngeal exudate.  Eyes: Conjunctivae and EOM are normal. Pupils are equal, round, and reactive to light. Right eye exhibits no discharge. Left eye exhibits no discharge.  Neck: Normal range of motion. Neck supple.  Cardiovascular: Normal rate, regular rhythm and normal heart sounds.   Pulmonary/Chest: Effort normal and breath sounds normal. No stridor. No respiratory distress. She has no wheezes. She has no rales. She exhibits no tenderness.  Lymphadenopathy:    She has no cervical adenopathy.  Skin: Skin is warm and dry. No rash noted. She is not diaphoretic.    ED Course  Procedures (including critical care time)  Labs Reviewed - No data to display Dg Chest 2 View  06/16/2011  *RADIOLOGY REPORT*  Clinical Data: Nonproductive cough for 2 weeks  CHEST - 2 VIEW  Comparison: 06/02/2010  Findings: The heart size and pulmonary vascularity are normal. The lungs appear clear and expanded without focal air space disease or consolidation. No blunting of the costophrenic angles.  Chronic calcification of the first costochondral junction.  Degenerative changes in the thoracic spine.  No pneumothorax.  No significant change since previous study.  IMPRESSION: No evidence of active pulmonary disease.  Original Report Authenticated By: Marlon Pel, M.D.      1. Bronchitis       MDM  I think she has a prolonged cough following a flulike illness which is due to reactive airway disease. On her chest x-ray there is no evidence of a recurrence of sarcoid. Will treat with a round of doxycycline as well as tramadol as a cough suppressant, a tapering course of prednisone, and she should continue on her albuterol every 4 hours. She was instructed to come back in a week if no improvement. The next step would be for her to see a lung specialist.        Roque Lias, MD 06/16/11 2122

## 2011-06-16 NOTE — ED Notes (Signed)
Coughing x 2 weeks

## 2011-06-27 ENCOUNTER — Emergency Department (HOSPITAL_COMMUNITY)
Admission: EM | Admit: 2011-06-27 | Discharge: 2011-06-27 | Disposition: A | Discharge status: Home / Self Care | Payer: Self-pay | Attending: Emergency Medicine | Admitting: Emergency Medicine

## 2011-06-27 ENCOUNTER — Emergency Department (HOSPITAL_COMMUNITY): Payer: Self-pay

## 2011-06-27 ENCOUNTER — Other Ambulatory Visit: Payer: Self-pay

## 2011-06-27 ENCOUNTER — Encounter (HOSPITAL_COMMUNITY): Payer: Self-pay | Admitting: *Deleted

## 2011-06-27 DIAGNOSIS — R42 Dizziness and giddiness: Secondary | ICD-10-CM | POA: Insufficient documentation

## 2011-06-27 DIAGNOSIS — Z79899 Other long term (current) drug therapy: Secondary | ICD-10-CM | POA: Insufficient documentation

## 2011-06-27 DIAGNOSIS — E119 Type 2 diabetes mellitus without complications: Secondary | ICD-10-CM | POA: Insufficient documentation

## 2011-06-27 DIAGNOSIS — E785 Hyperlipidemia, unspecified: Secondary | ICD-10-CM | POA: Insufficient documentation

## 2011-06-27 DIAGNOSIS — J99 Respiratory disorders in diseases classified elsewhere: Secondary | ICD-10-CM | POA: Insufficient documentation

## 2011-06-27 DIAGNOSIS — D869 Sarcoidosis, unspecified: Secondary | ICD-10-CM | POA: Insufficient documentation

## 2011-06-27 DIAGNOSIS — I10 Essential (primary) hypertension: Secondary | ICD-10-CM | POA: Insufficient documentation

## 2011-06-27 LAB — DIFFERENTIAL
Basophils Absolute: 0 10*3/uL (ref 0.0–0.1)
Lymphocytes Relative: 14 % (ref 12–46)
Neutro Abs: 8.4 10*3/uL — ABNORMAL HIGH (ref 1.7–7.7)

## 2011-06-27 LAB — URINALYSIS, ROUTINE W REFLEX MICROSCOPIC
Ketones, ur: NEGATIVE mg/dL
Leukocytes, UA: NEGATIVE
Nitrite: NEGATIVE
Specific Gravity, Urine: 1.01 (ref 1.005–1.030)
pH: 6 (ref 5.0–8.0)

## 2011-06-27 LAB — CBC
Platelets: 305 10*3/uL (ref 150–400)
RBC: 4.85 MIL/uL (ref 3.87–5.11)
RDW: 14.5 % (ref 11.5–15.5)
WBC: 10.4 10*3/uL (ref 4.0–10.5)

## 2011-06-27 LAB — BASIC METABOLIC PANEL
CO2: 25 mEq/L (ref 19–32)
Chloride: 101 mEq/L (ref 96–112)
Potassium: 4 mEq/L (ref 3.5–5.1)
Sodium: 137 mEq/L (ref 135–145)

## 2011-06-27 LAB — TROPONIN I
Troponin I: <0.3 ng/mL (ref <0.30)
Troponin I: <0.3 ng/mL (ref <0.30)

## 2011-06-27 MED ORDER — MECLIZINE HCL 50 MG PO TABS: 50.0000 mg | ORAL_TABLET | Freq: Three times a day (TID) | ORAL | Status: AC | PRN

## 2011-06-27 NOTE — ED Provider Notes (Signed)
History     CSN: [id number]  Arrival date & time 06/27/11  1718   First MD Initiated Contact with Patient 06/27/11 1800      Chief Complaint  Patient presents with  . Dizziness    (Consider location/radiation/quality/duration/timing/severity/associated sxs/prior treatment) HPI Comments: Patient presents with intermittent dizziness for the past 2 weeks. She describes the dizziness as a lightheaded sensation and feeling like she going to pass out. She denies any vertigo or room spinning. She has been treated multiple times in December for bronchitis with prednisone, doxycycline and Zithromax. She thinks the symptoms started shortly after that. She denies any chest pain, shortness of breath, nausea, vomiting, diarrhea, weakness, numbness or tingling. Headache, vision change, neck pain, fever or chills. The dizziness worsens with position changes and she is not actually pass out. She admits to good by mouth intake and urine output.  The history is provided by the patient.    Past Medical History  Diagnosis Date  . Hyperlipidemia   . Diabetes mellitus   . Hypertension   . Sarcoidosis of lung     Past Surgical History  Procedure Date  . Tubal ligation     Family History  Problem Relation Age of Onset  . Diabetes Mother   . Hyperlipidemia Mother   . Hypertension Mother   . Hyperlipidemia Father   . Hypertension Father   . Diabetes Father     History  Substance Use Topics  . Smoking status: Never Smoker   . Smokeless tobacco: Never Used  . Alcohol Use: No    OB History    Grav Para Term Preterm Abortions TAB SAB Ect Mult Living                  Review of Systems  Constitutional: Negative for fever, activity change and appetite change.  HENT: Positive for congestion and rhinorrhea.   Respiratory: Positive for cough. Negative for chest tightness and shortness of breath.   Cardiovascular: Negative for chest pain.  Gastrointestinal: Negative for nausea, vomiting and  abdominal pain.  Genitourinary: Negative for dysuria and hematuria.  Musculoskeletal: Negative for back pain.  Skin: Negative for rash.  Neurological: Positive for dizziness and light-headedness. Negative for syncope, weakness and numbness.    Allergies  Review of patient's allergies indicates no known allergies.  Home Medications   Current Outpatient Rx  Name Route Sig Dispense Refill  . ALBUTEROL SULFATE HFA 108 (90 BASE) MCG/ACT IN AERS Inhalation Inhale 2 puffs into the lungs every 4 (four) hours as needed for wheezing or shortness of breath. 1 Inhaler 0  . IBUPROFEN 200 MG PO TABS Oral Take 800 mg by mouth once as needed. For pain.     Marland Kitchen PREDNISONE 10 MG PO TABS  Take 4 tabs daily for 4 days, 3 tabs daily for 4 days, 2 tabs daily for 4 days, then 1 tab daily for 4 days.  Take all tabs at one time with food and preferably in the morning except for the first dose. 40 tablet 0  . MECLIZINE HCL 50 MG PO TABS Oral Take 1 tablet (50 mg total) by mouth 3 (three) times daily as needed. 30 tablet 0  . PRAVASTATIN SODIUM 20 MG PO TABS Oral Take 1 tablet (20 mg total) by mouth every evening. 90 tablet 1    BP 134/83  Pulse 76  Temp(Src) 97.5 F (36.4 C) (Oral)  Resp 16  SpO2 98%  LMP 05/22/2011  Physical Exam  Constitutional: She is  oriented to person, place, and time. She appears well-developed and well-nourished. No distress.  HENT:  Head: Normocephalic and atraumatic.  Mouth/Throat: Oropharynx is clear and moist. No oropharyngeal exudate.  Eyes: Conjunctivae are normal. Pupils are equal, round, and reactive to light.  Neck: Normal range of motion. Neck supple.  Cardiovascular: Normal rate, regular rhythm and normal heart sounds.   Pulmonary/Chest: Effort normal and breath sounds normal. No respiratory distress.  Abdominal: Soft. There is no tenderness. There is no rebound and no guarding.  Musculoskeletal: Normal range of motion.  Neurological: She is alert and oriented to  person, place, and time. No cranial nerve deficit.       About 5 strength throughout, no facial asymmetry, no ataxia finger to nose., Normal gait, negative Romberg Test of skew negative, head impulse testing within normal limits, no nystagmus  Skin: Skin is warm.    ED Course  Procedures (including critical care time)  Labs Reviewed  DIFFERENTIAL - Abnormal; Notable for the following:    Neutrophils Relative 81 (*)    Neutro Abs 8.4 (*)    All other components within normal limits  BASIC METABOLIC PANEL - Abnormal; Notable for the following:    Glucose, Bld 184 (*)    All other components within normal limits  URINALYSIS, ROUTINE W REFLEX MICROSCOPIC - Abnormal; Notable for the following:    Glucose, UA 500 (*)    All other components within normal limits  CBC  TROPONIN I  PREGNANCY, URINE  D-DIMER, QUANTITATIVE  TROPONIN I  LAB REPORT - SCANNED   Ct Head Wo Contrast  06/27/2011  *RADIOLOGY REPORT*  Clinical Data: Dizziness.  CT HEAD WITHOUT CONTRAST  Technique:  Contiguous axial images were obtained from the base of the skull through the vertex without contrast.  Comparison: None.  Findings: No mass lesion, mass effect, midline shift, hydrocephalus, hemorrhage.  No territorial ischemia or acute infarction.  IMPRESSION: Paranasal sinuses appear within normal limits.  Mastoid air cells clear.  Original Report Authenticated By: Andreas Newport, M.D.     1. Dizziness       MDM  Lightheadedness and dizziness without neurological deficit. Patient denies vertigo and does not have any nystagmus or worrisome neurological symptoms. Normal gait, no ataxia, no nystagmus.    EKG, labs and cardiac enzymes and d-dimer, orthostatic vitals  Hemoglobin stable. Orthostatic vitals negative.  EKG wnl, troponin and D-dimer negative.  CT head to evaluate for mass lesion pending at change of shift.  PAC [place] to disposition.   Date: 06/27/2011  Rate: 86  Rhythm: normal sinus rhythm  QRS  Axis: normal  Intervals: normal  ST/T Wave abnormalities: normal  Conduction Disutrbances:none  Narrative Interpretation:   Old EKG Reviewed: none available    Glynn Octave, MD 06/28/11 1039

## 2011-06-27 NOTE — ED Notes (Signed)
Patient c/o dizziness for two weeks.  Dizziness is constant.  Denies the room is spinning and feels like "something moving in her head". States that she feels like she is going to pass out

## 2011-06-27 NOTE — ED Notes (Signed)
Offered pt ginger ale, tolerated well

## 2011-06-27 NOTE — ED Notes (Signed)
Transported to CT in wheelchair with tech.

## 2011-06-27 NOTE — ED Provider Notes (Signed)
History     CSN: [id number]  Arrival date & time 06/27/11  1718   First MD Initiated Contact with Patient 06/27/11 1800      Chief Complaint  Patient presents with  . Dizziness    (Consider location/radiation/quality/duration/timing/severity/associated sxs/prior treatment) HPI  Past Medical History  Diagnosis Date  . Hyperlipidemia   . Diabetes mellitus   . Hypertension   . Sarcoidosis of lung     Past Surgical History  Procedure Date  . Tubal ligation     Family History  Problem Relation Age of Onset  . Diabetes Mother   . Hyperlipidemia Mother   . Hypertension Mother   . Hyperlipidemia Father   . Hypertension Father   . Diabetes Father     History  Substance Use Topics  . Smoking status: Never Smoker   . Smokeless tobacco: Never Used  . Alcohol Use: No    OB History    Grav Para Term Preterm Abortions TAB SAB Ect Mult Living                  Review of Systems  Allergies  Review of patient's allergies indicates no known allergies.  Home Medications   Current Outpatient Rx  Name Route Sig Dispense Refill  . ALBUTEROL SULFATE HFA 108 (90 BASE) MCG/ACT IN AERS Inhalation Inhale 2 puffs into the lungs every 4 (four) hours as needed for wheezing or shortness of breath. 1 Inhaler 0  . IBUPROFEN 200 MG PO TABS Oral Take 800 mg by mouth once as needed. For pain.     Marland Kitchen PREDNISONE 10 MG PO TABS  Take 4 tabs daily for 4 days, 3 tabs daily for 4 days, 2 tabs daily for 4 days, then 1 tab daily for 4 days.  Take all tabs at one time with food and preferably in the morning except for the first dose. 40 tablet 0  . DOXYCYCLINE HYCLATE 100 MG PO TABS Oral Take 1 tablet (100 mg total) by mouth 2 (two) times daily. 20 tablet 0  . PRAVASTATIN SODIUM 20 MG PO TABS Oral Take 1 tablet (20 mg total) by mouth every evening. 90 tablet 1  . TRAMADOL HCL 50 MG PO TABS Oral Take 2 tablets (100 mg total) by mouth every 8 (eight) hours as needed for pain. Maximum dose= 8 tablets  per day 30 tablet 0    BP 134/83  Pulse 76  Temp(Src) 97.5 F (36.4 C) (Oral)  Resp 16  SpO2 98%  LMP 05/22/2011  Physical Exam  ED Course  Procedures (including critical care time)  Labs Reviewed  DIFFERENTIAL - Abnormal; Notable for the following:    Neutrophils Relative 81 (*)    Neutro Abs 8.4 (*)    All other components within normal limits  BASIC METABOLIC PANEL - Abnormal; Notable for the following:    Glucose, Bld 184 (*)    All other components within normal limits  URINALYSIS, ROUTINE W REFLEX MICROSCOPIC - Abnormal; Notable for the following:    Glucose, UA 500 (*)    All other components within normal limits  CBC  TROPONIN I  PREGNANCY, URINE  D-DIMER, QUANTITATIVE  TROPONIN I   Ct Head Wo Contrast  06/27/2011  *RADIOLOGY REPORT*  Clinical Data: Dizziness.  CT HEAD WITHOUT CONTRAST  Technique:  Contiguous axial images were obtained from the base of the skull through the vertex without contrast.  Comparison: None.  Findings: No mass lesion, mass effect, midline shift, hydrocephalus, hemorrhage.  No territorial ischemia or acute infarction.  IMPRESSION: Paranasal sinuses appear within normal limits.  Mastoid air cells clear.  Original Report Authenticated By: Andreas Newport, M.D.     Dizziness    MDM  Head CT was negative for any acute findings, second troponin also negative so we do not suspect a cardiac or neurological etiology for this dizziness, will place on meclizine though there is no real vertiginous signs.        Cheryl Carroll, [place] 06/27/11 2213

## 2011-06-28 NOTE — ED Provider Notes (Signed)
Medical screening examination/treatment/procedure(s) were conducted as a shared visit with non-physician practitioner(s) and myself.  I personally evaluated the patient during the encounter   Muzammil Bruins, MD 06/28/11 1036 

## 2011-09-05 ENCOUNTER — Emergency Department (INDEPENDENT_AMBULATORY_CARE_PROVIDER_SITE_OTHER)
Admission: EM | Admit: 2011-09-05 | Discharge: 2011-09-05 | Disposition: A | Discharge status: Home / Self Care | Payer: Self-pay | Source: Home / Self Care

## 2011-09-05 ENCOUNTER — Encounter (HOSPITAL_COMMUNITY): Payer: Self-pay

## 2011-09-05 DIAGNOSIS — J029 Acute pharyngitis, unspecified: Secondary | ICD-10-CM

## 2011-09-05 DIAGNOSIS — B9789 Other viral agents as the cause of diseases classified elsewhere: Secondary | ICD-10-CM

## 2011-09-05 NOTE — ED Provider Notes (Signed)
Cheryl Carroll is a 46 y.o. female who presents to Urgent Care today for sore throat and ear pressure for past 3 days.  States pain when she eats or drinks something, but she has been eating and drinking well.  Mild sinus congestion, mild nonproductive cough.  No trouble with airway.  Has tried salt water gargles with some relief, also with OTC decongestant use.  No fevers or chills, no N/V/abd pain.     PMH reviewed.  ROS as above otherwise neg Medications reviewed. No current facility-administered medications for this encounter.   Current Outpatient Prescriptions  Medication Sig Dispense Refill  . albuterol (PROVENTIL HFA;VENTOLIN HFA) 108 (90 BASE) MCG/ACT inhaler Inhale 2 puffs into the lungs every 4 (four) hours as needed for wheezing or shortness of breath.  1 Inhaler  0  . ibuprofen (ADVIL,MOTRIN) 200 MG tablet Take 800 mg by mouth once as needed. For pain.       . pravastatin (PRAVACHOL) 20 MG tablet Take 1 tablet (20 mg total) by mouth every evening.  90 tablet  1  . predniSONE (DELTASONE) 10 MG tablet Take 4 tabs daily for 4 days, 3 tabs daily for 4 days, 2 tabs daily for 4 days, then 1 tab daily for 4 days.  Take all tabs at one time with food and preferably in the morning except for the first dose.  40 tablet  0    Exam:  BP 159/95  Pulse 90  Temp(Src) 99.2 F (37.3 C) (Oral)  Resp 16  SpO2 99%  LMP 08/19/2011 Gen: Well NAD HEENT: EOMI,  MMM.  Nasal turbinates mildly erythematous and edematous.  Tonsils mildly erythematous but not swollen.  Ears with clear canals and TM's BL. Lungs: CTABL Nl WOB Heart: RRR no MRG Abd: NABS, NT, ND   Assessment and Plan: 1.  Viral pharyngitis:  Symptomatic treatment.  Discussed Chloraseptic/throat lozenges.  I provided the patient with explicit warnings and red flags that would prompt return to clinic or the ED.    2.  Elevated BP: Likely secondary to decongestant use.  Recommended stopping this, FU with PCP in 2-4 weeks for BP check.   Known history of HTN per patient, she states she has been taking her anti-hypertensives as prescribed.    Renold Don, MD 09/05/11 1610  Renold Don, MD 09/05/11 9604

## 2011-09-05 NOTE — Discharge Instructions (Signed)
Use the Chloraseptic spray and throat lozenges for your throat.   Make sure you keep staying hydrated like you have been doing so far.   Come back and see [place] if your symptoms worsen or fail to improve.    Sore Throat A sore throat is felt inside the throat and at the back of the mouth. It hurts to swallow or the throat may feel dry and scratchy. It can be caused by germs, smoking, pollution, or allergies.  HOME CARE   Only take medicine as told by your doctor.   Drink enough fluids to keep your pee (urine) clear or pale yellow.   Eat soft foods.   Do not smoke.   Rinse the mouth (gargle) with warm water or salt water ( teaspoon salt in 8 ounces of water).   Try throat sprays, lozenges, or suck on hard candy.  GET HELP RIGHT AWAY IF:   You have trouble breathing.   Your sore throat lasts longer than 1 week.   There is more puffiness (swelling) in the throat.   The pain is so bad that you are unable to swallow.   You have a very bad headache or a red rash.   You start to throw up (vomit).   You or your child has a temperature by mouth above 102 F (38.9 C), not controlled by medicine.   Your baby is older than 3 months with a rectal temperature of 102 F (38.9 C) or higher.   Your baby is 36 months old or younger with a rectal temperature of 100.4 F (38 C) or higher.  MAKE SURE YOU:   Understand these instructions.   Will watch your condition.   Will get help right away if you are not doing well or get worse.  Document Released: 03/29/2008 Document Revised: 06/09/2011 Document Reviewed: 03/29/2008 [facility] Patient Information 2012 [place], [place].

## 2011-09-05 NOTE — ED Notes (Signed)
C/o has been having pain from neck up, feels good from neck down; ears, nose, throat hurt

## 2011-09-06 NOTE — ED Provider Notes (Signed)
Medical screening examination/treatment/procedure(s) were performed by resident physician or non-physician practitioner and as supervising physician I was immediately available for consultation/collaboration.   Barkley Bruns MD.    Barkley Bruns, MD 09/06/11 [phone]

## 2011-09-09 ENCOUNTER — Emergency Department (HOSPITAL_COMMUNITY)
Admission: EM | Admit: 2011-09-09 | Discharge: 2011-09-09 | Disposition: A | Discharge status: Home / Self Care | Payer: Self-pay | Attending: Emergency Medicine | Admitting: Emergency Medicine

## 2011-09-09 ENCOUNTER — Encounter (HOSPITAL_COMMUNITY): Payer: Self-pay | Admitting: *Deleted

## 2011-09-09 DIAGNOSIS — E119 Type 2 diabetes mellitus without complications: Secondary | ICD-10-CM | POA: Insufficient documentation

## 2011-09-09 DIAGNOSIS — D869 Sarcoidosis, unspecified: Secondary | ICD-10-CM | POA: Insufficient documentation

## 2011-09-09 DIAGNOSIS — J329 Chronic sinusitis, unspecified: Secondary | ICD-10-CM | POA: Insufficient documentation

## 2011-09-09 DIAGNOSIS — I10 Essential (primary) hypertension: Secondary | ICD-10-CM | POA: Insufficient documentation

## 2011-09-09 DIAGNOSIS — E785 Hyperlipidemia, unspecified: Secondary | ICD-10-CM | POA: Insufficient documentation

## 2011-09-09 MED ORDER — AMOXICILLIN-POT CLAVULANATE 875-125 MG PO TABS: 1.0000 | ORAL_TABLET | Freq: Two times a day (BID) | ORAL | Status: AC

## 2011-09-09 NOTE — ED Provider Notes (Signed)
History     CSN: [id number]  Arrival date & time 09/09/11  2115   None     Chief Complaint  Patient presents with  . Chills    (Consider location/radiation/quality/duration/timing/severity/associated sxs/prior treatment) The history is provided by the patient.   issue with a history of hypertension and diabetes presents to the emergency department with one week of headache, nonproductive cough, sinus pain and pressure, bilateral ear pain, and a sore throat. She has had chills but no documented fever. She denies any associated neck pain stiffness or swelling, chest pain, shortness of breath, myalgias. She was seen in urgent care on 09/05/2011 and diagnosed with an upper respiratory infection. She has been using symptomatic treatment as directed at that time but feels she has not had any improvement.  Past Medical History  Diagnosis Date  . Hyperlipidemia   . Diabetes mellitus   . Hypertension   . Sarcoidosis of lung     Past Surgical History  Procedure Date  . Tubal ligation     Family History  Problem Relation Age of Onset  . Diabetes Mother   . Hyperlipidemia Mother   . Hypertension Mother   . Hyperlipidemia Father   . Hypertension Father   . Diabetes Father     History  Substance Use Topics  . Smoking status: Never Smoker   . Smokeless tobacco: Never Used  . Alcohol Use: No     Review of Systems 10 systems reviewed and are negative for acute change except as noted in the HPI.  Allergies  Review of patient's allergies indicates no known allergies.  Home Medications   Current Outpatient Rx  Name Route Sig Dispense Refill  . IBUPROFEN 200 MG PO TABS Oral Take 800 mg by mouth once as needed. For pain.       BP 141/74  Pulse 108  Temp(Src) 99.6 F (37.6 C) (Oral)  Resp 12  SpO2 98%  LMP 08/19/2011  Physical Exam  Nursing note and vitals reviewed. Constitutional: She is oriented to person, place, and time. She appears well-developed and  well-nourished. No distress.  HENT:  Head: Normocephalic and atraumatic. No trismus in the jaw.  Right Ear: External ear normal. Tympanic membrane is injected.  Left Ear: External ear normal. Tympanic membrane is injected.  Nose: Mucosal edema and rhinorrhea present.  Mouth/Throat: Uvula is midline and mucous membranes are normal. No uvula swelling. Posterior oropharyngeal erythema present. No oropharyngeal exudate or posterior oropharyngeal edema.       Mild maxillary sinus tenderness to percussion on the left  Eyes: Conjunctivae are normal. Pupils are equal, round, and reactive to light.  Neck: Normal range of motion. Neck supple.  Cardiovascular: Normal rate, regular rhythm and normal heart sounds.        HR 98 palp on exam  Pulmonary/Chest: Effort normal and breath sounds normal. No respiratory distress. She has no wheezes. She has no rales. She exhibits no tenderness.  Abdominal: Soft. She exhibits no distension. There is no tenderness.  Musculoskeletal: She exhibits no edema and no tenderness.  Lymphadenopathy:    She has no cervical adenopathy.  Neurological: She is alert and oriented to person, place, and time. No cranial nerve deficit.  Skin: Skin is warm and dry.  Psychiatric: She has a normal mood and affect.    ED Course  Procedures (including critical care time)  Labs Reviewed - No data to display No results found.   Dx 1: Sinusitis   MDM  Clinical exam consistent  with sinusitis. Good oxygenation, no shortness of breath, no adventitious breath sounds are decreased air movement-doubt pneumonia. As the patient has had symptoms for just over one week and this is her second visit to health care facility with the same illness, will treat for a bacterial sinusitis.        Shaaron Adler, [place] 09/09/11 [phone]

## 2011-09-09 NOTE — ED Notes (Signed)
Pt states she has chills, sore throat, nose is burning and same with her ears. States "I have this cough but I'm scared to cough because it could make my head hurt." Pt states "I'm all congested."

## 2011-09-09 NOTE — Discharge Instructions (Signed)

## 2011-09-09 NOTE — ED Notes (Signed)
The pt has had a cold for 7 days.  She has had a headache cough sinus pain.  She has been seen  At the urgent care for the same

## 2011-09-10 NOTE — ED Provider Notes (Signed)
Medical screening examination/treatment/procedure(s) were performed by non-physician practitioner and as supervising physician I was immediately available for consultation/collaboration.   Vida Roller, MD 09/10/11 0500

## 2012-06-06 ENCOUNTER — Encounter (HOSPITAL_COMMUNITY): Payer: Self-pay | Admitting: *Deleted

## 2012-06-06 ENCOUNTER — Emergency Department (HOSPITAL_COMMUNITY)
Admission: EM | Admit: 2012-06-06 | Discharge: 2012-06-07 | Disposition: A | Discharge status: Home / Self Care | Payer: Self-pay | Attending: Emergency Medicine | Admitting: Emergency Medicine

## 2012-06-06 ENCOUNTER — Emergency Department (HOSPITAL_COMMUNITY): Payer: Self-pay

## 2012-06-06 ENCOUNTER — Other Ambulatory Visit: Payer: Self-pay

## 2012-06-06 DIAGNOSIS — E119 Type 2 diabetes mellitus without complications: Secondary | ICD-10-CM | POA: Insufficient documentation

## 2012-06-06 DIAGNOSIS — J029 Acute pharyngitis, unspecified: Secondary | ICD-10-CM | POA: Insufficient documentation

## 2012-06-06 DIAGNOSIS — R0982 Postnasal drip: Secondary | ICD-10-CM | POA: Insufficient documentation

## 2012-06-06 DIAGNOSIS — R059 Cough, unspecified: Secondary | ICD-10-CM

## 2012-06-06 DIAGNOSIS — J3489 Other specified disorders of nose and nasal sinuses: Secondary | ICD-10-CM | POA: Insufficient documentation

## 2012-06-06 DIAGNOSIS — H9209 Otalgia, unspecified ear: Secondary | ICD-10-CM | POA: Insufficient documentation

## 2012-06-06 DIAGNOSIS — I10 Essential (primary) hypertension: Secondary | ICD-10-CM | POA: Insufficient documentation

## 2012-06-06 DIAGNOSIS — R05 Cough: Secondary | ICD-10-CM

## 2012-06-06 DIAGNOSIS — Z8619 Personal history of other infectious and parasitic diseases: Secondary | ICD-10-CM | POA: Insufficient documentation

## 2012-06-06 DIAGNOSIS — E785 Hyperlipidemia, unspecified: Secondary | ICD-10-CM | POA: Insufficient documentation

## 2012-06-06 DIAGNOSIS — R0789 Other chest pain: Secondary | ICD-10-CM | POA: Insufficient documentation

## 2012-06-06 DIAGNOSIS — J019 Acute sinusitis, unspecified: Secondary | ICD-10-CM

## 2012-06-06 DIAGNOSIS — R110 Nausea: Secondary | ICD-10-CM | POA: Insufficient documentation

## 2012-06-06 LAB — CBC WITH DIFFERENTIAL/PLATELET
Basophils Absolute: 0 10*3/uL (ref 0.0–0.1)
Eosinophils Relative: 3 % (ref 0–5)
Lymphocytes Relative: 32 % (ref 12–46)
Neutro Abs: 4.3 10*3/uL (ref 1.7–7.7)
Platelets: 333 10*3/uL (ref 150–400)
RDW: 14.2 % (ref 11.5–15.5)
WBC: 7.2 10*3/uL (ref 4.0–10.5)

## 2012-06-06 LAB — COMPREHENSIVE METABOLIC PANEL
ALT: 14 U/L (ref 0–35)
AST: 21 U/L (ref 0–37)
CO2: 28 mEq/L (ref 19–32)
Calcium: 9.7 mg/dL (ref 8.4–10.5)
Chloride: 102 mEq/L (ref 96–112)
GFR calc non Af Amer: >90 mL/min (ref >90)
Sodium: 140 mEq/L (ref 135–145)

## 2012-06-06 MED ORDER — AMOXICILLIN-POT CLAVULANATE 500-125 MG PO TABS: 1.0000 | ORAL_TABLET | Freq: Three times a day (TID) | ORAL | Status: DC

## 2012-06-06 MED ORDER — FLUTICASONE PROPIONATE 50 MCG/ACT NA SUSP: 2.0000 | Freq: Every day | NASAL | Status: DC

## 2012-06-06 NOTE — ED Notes (Addendum)
Pt states productive cough for 4 days. Pt states congestion as well. Pt states sick grandchildren. Pt states that her chest feels tight, sore and pressure on it.

## 2012-06-06 NOTE — ED Notes (Signed)
Pt reports dry productive cough x4 days; pt denying fever and chills; c/o chest heaviness and feels very congested; pt reports coughing up green sputum and sinuses feeling tender to touch; cough worse when pt goes outside in the cold; nad;

## 2012-06-06 NOTE — ED Provider Notes (Signed)
History     CSN: [id number]  Arrival date & time 06/06/12  2028   First MD Initiated Contact with Patient 06/06/12 2332      Chief Complaint  Patient presents with  . Shortness of Breath  . Cough    (Consider location/radiation/quality/duration/timing/severity/associated sxs/prior treatment) HPI Comments: 46 year old female presents to the emergency department complaining sinus congestion and productive cough with white and yellow phlegm for the past 4 days. She tried taking Robitussin and drinking hot tea without relief. Admits to associated sore throat, chest congestion and tightness. Cough is worse when she is out in the cold. Her grandchildren are sick currently. Admits to nausea without vomiting. Denies fever, chills, abdominal pain or diarrhea.  Patient is a 46 y.o. female presenting with shortness of breath and cough. The history is provided by the patient.  Shortness of Breath  Associated symptoms include sore throat, cough and shortness of breath. Pertinent negatives include no fever.  Cough Associated symptoms include ear pain (fullness), sore throat and shortness of breath. Pertinent negatives include no chills.    Past Medical History  Diagnosis Date  . Hyperlipidemia   . Diabetes mellitus   . Hypertension   . Sarcoidosis of lung     Past Surgical History  Procedure Date  . Tubal ligation     Family History  Problem Relation Age of Onset  . Diabetes Mother   . Hyperlipidemia Mother   . Hypertension Mother   . Hyperlipidemia Father   . Hypertension Father   . Diabetes Father     History  Substance Use Topics  . Smoking status: Never Smoker   . Smokeless tobacco: Never Used  . Alcohol Use: No    OB History    Grav Para Term Preterm Abortions TAB SAB Ect Mult Living                  Review of Systems  Constitutional: Negative for fever and chills.  HENT: Positive for ear pain (fullness), congestion, sore throat, postnasal drip and sinus  pressure. Negative for trouble swallowing, neck pain and neck stiffness.   Respiratory: Positive for cough, chest tightness and shortness of breath.   Gastrointestinal: Positive for nausea. Negative for vomiting and abdominal pain.  All other systems reviewed and are negative.    Allergies  Review of patient's allergies indicates no known allergies.  Home Medications  No current outpatient prescriptions on file.  BP 167/98  Pulse 98  Temp 98.7 F (37.1 C) (Oral)  Resp 20  SpO2 98%  Physical Exam  Nursing note and vitals reviewed. Constitutional: She is oriented to person, place, and time. She appears well-developed and well-nourished. No distress.  HENT:  Head: Normocephalic and atraumatic.  Right Ear: Hearing, tympanic membrane, external ear and ear canal normal.  Left Ear: Hearing, tympanic membrane, external ear and ear canal normal.  Nose: Mucosal edema present. Right sinus exhibits maxillary sinus tenderness. Left sinus exhibits maxillary sinus tenderness.  Mouth/Throat: Uvula is midline and mucous membranes are normal. Posterior oropharyngeal edema and posterior oropharyngeal erythema present. No oropharyngeal exudate.       Purulent post nasal drip present.  Eyes: Conjunctivae normal are normal.  Neck: Normal range of motion. Neck supple.  Cardiovascular: Normal rate, regular rhythm and normal heart sounds.   Pulmonary/Chest: Effort normal and breath sounds normal. She has no wheezes.  Abdominal: Soft. Bowel sounds are normal. There is no tenderness.  Musculoskeletal: Normal range of motion. She exhibits no edema.  Lymphadenopathy:  She has cervical adenopathy.       Right cervical: Superficial cervical adenopathy present.       Left cervical: Superficial cervical adenopathy present.  Neurological: She is alert and oriented to person, place, and time.  Skin: Skin is warm and dry.  Psychiatric: She has a normal mood and affect. Her behavior is normal.    ED Course   Procedures (including critical care time)  Labs Reviewed  COMPREHENSIVE METABOLIC PANEL - Abnormal; Notable for the following:    Glucose, Bld 180 (*)     Total Bilirubin 0.2 (*)     All other components within normal limits  CBC WITH DIFFERENTIAL  POCT I-STAT TROPONIN I   Dg Chest 2 View  06/06/2012  *RADIOLOGY REPORT*  Clinical Data:  Shortness of breath, wheezing and cough.  CHEST - 2 VIEW  Comparison: 06/16/2011  Findings: The heart size and mediastinal contours are within normal limits.  Both lungs are clear.  The visualized skeletal structures are unremarkable.  IMPRESSION: No active disease.   Original Report Authenticated By: Irish Lack, M.D.      1. Acute sinusitis   2. Cough       MDM  46 y/o female with sinusitis. Lungs clear. Labs and xray obtained in triage prior to being seen which were unremarkable. Patient is stable and in NAD. Rx augmentin and flonase. Advised saline nasal rinses and salt water gargles.        Trevor Mace, PA-C 06/06/12 2355

## 2012-06-07 NOTE — ED Provider Notes (Signed)
Medical screening examination/treatment/procedure(s) were performed by non-physician practitioner and as supervising physician I was immediately available for consultation/collaboration.  Olivia Mackie, MD 06/07/12 [phone]

## 2012-10-15 ENCOUNTER — Encounter: Payer: Self-pay | Admitting: Medical

## 2012-10-15 ENCOUNTER — Ambulatory Visit (INDEPENDENT_AMBULATORY_CARE_PROVIDER_SITE_OTHER): Payer: BC Managed Care – PPO | Admitting: Medical

## 2012-10-15 VITALS — BP 130/80 | HR 84 | Temp 98.4°F | Resp 16 | Wt 168.0 lb

## 2012-10-15 DIAGNOSIS — R053 Chronic cough: Secondary | ICD-10-CM

## 2012-10-15 DIAGNOSIS — J309 Allergic rhinitis, unspecified: Secondary | ICD-10-CM

## 2012-10-15 DIAGNOSIS — R059 Cough, unspecified: Secondary | ICD-10-CM

## 2012-10-15 DIAGNOSIS — R05 Cough: Secondary | ICD-10-CM

## 2012-10-15 MED ORDER — BENZONATATE 200 MG PO CAPS: 200.0000 mg | ORAL_CAPSULE | Freq: Three times a day (TID) | ORAL | Status: DC | PRN

## 2012-10-15 MED ORDER — FLUTICASONE PROPIONATE 50 MCG/ACT NA SUSP: 2.0000 | Freq: Every day | NASAL | Status: DC

## 2012-10-15 NOTE — Patient Instructions (Addendum)
Begin Zyrtec 10mg  tablet OTC at bedtime for allergies.   Use the Tessalon Perles cough drops up to 3 times daily for the next several days as needed.  If not fully improved in 2 weeks, consider adding Prilosec OTC in the morning to help with acid reflux.  If no improvement in 2-3 weeks, or if worsening, then come back for recheck , possible chest xray.  Cough, Adult  A cough is a reflex that helps clear your throat and airways. It can help heal the body or may be a reaction to an irritated airway. A cough may only last 2 or 3 weeks (acute) or may last more than 8 weeks (chronic).  CAUSES Acute cough:  Viral or bacterial infections. Chronic cough:  Infections.  Allergies.  Asthma.  Post-nasal drip.  Smoking.  Heartburn or acid reflux.  Some medicines.  Chronic lung problems (COPD).  Cancer. SYMPTOMS   Cough.  Fever.  Chest pain.  Increased breathing rate.  High-pitched whistling sound when breathing (wheezing).  Colored mucus that you cough up (sputum). TREATMENT   A bacterial cough may be treated with antibiotic medicine.  A viral cough must run its course and will not respond to antibiotics.  Your caregiver may recommend other treatments if you have a chronic cough. HOME CARE INSTRUCTIONS   Only take over-the-counter or prescription medicines for pain, discomfort, or fever as directed by your caregiver. Use cough suppressants only as directed by your caregiver.  Use a cold steam vaporizer or humidifier in your bedroom or home to help loosen secretions.  Sleep in a semi-upright position if your cough is worse at night.  Rest as needed.  Stop smoking if you smoke. SEEK IMMEDIATE MEDICAL CARE IF:   You have pus in your sputum.  Your cough starts to worsen.  You cannot control your cough with suppressants and are losing sleep.  You begin coughing up blood.  You have difficulty breathing.  You develop pain which is getting worse or is uncontrolled  with medicine.  You have a fever. MAKE SURE YOU:   Understand these instructions.  Will watch your condition.  Will get help right away if you are not doing well or get worse. Document Released: 12/17/2010 Document Revised: 09/12/2011 Document Reviewed: 12/17/2010 [facility] Patient Information 2013 [place], [place].

## 2012-10-15 NOTE — Progress Notes (Signed)
Subjective:  Cheryl Carroll is a 47 y.o. female who presents for cough x 1+ months.   Gets to coughing and choking, uncontrollable cough spells. 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  She does report runny nose, sneezing.  Sometimes uses the Flonase but not daily.  Feels lots of nasal congestion, sneezing, runny nose. No sore throat, no NVD, no belly pain, no increasing belching.  No itchy or watery eyes.  No fever, no weight loss, no night sweats.  She does have hx/o sarcoidosis, hasn't seen pulmonologist in a while.  No other aggravating or relieving factors.    No other c/o.  The following portions of the patient's history were reviewed and updated as appropriate: allergies, current medications, past family history, past medical history, past social history, past surgical history and problem list.  ROS Otherwise as in subjective above  Objective: Physical Exam  Vital signs reviewed  General appearance: alert, no distress, WD/WN HEENT: normocephalic, sclerae anicteric, conjunctiva pink and moist, TMs pearly, nares patent, clear discharge, mild turbinate edema, mild erythema, pharynx normal Oral cavity: MMM, no lesions Neck: supple, no lymphadenopathy, no thyromegaly, no masses Heart: RRR, normal S1, S2, no murmurs Lungs: CTA bilaterally, no wheezes, rhonchi, or rales Pulses: 2+ radial pulses, 2+ pedal pulses, normal cap refill Ext: no edema   Assessment: Encounter Diagnoses  Name Primary?  . Chronic cough Yes  . Allergic rhinitis      Plan: Etiology most likely allergic rhinitis, post nasal drip leading to cough. Begin Zyrtec 10mg  tablet OTC at bedtime for allergies.   Use the Tessalon Perles cough drops up to 3 times daily for the next several days as needed.  If not fully improved in 2 weeks, consider adding Prilosec OTC in the morning to help with acid reflux.  If no improvement in 2-3 weeks, or if  worsening, then come back for recheck , possible chest xray.  Follow up: 2-3 wk

## 2012-10-16 ENCOUNTER — Telehealth: Payer: Self-pay | Admitting: Family Medicine

## 2012-10-16 NOTE — Telephone Encounter (Signed)
I called out Flonase for the patient to her pharmacy per Crosby Oyster PA-C. CLS

## 2012-12-17 ENCOUNTER — Encounter: Payer: Self-pay | Admitting: Family Medicine

## 2012-12-17 ENCOUNTER — Ambulatory Visit (INDEPENDENT_AMBULATORY_CARE_PROVIDER_SITE_OTHER): Payer: BC Managed Care – PPO | Admitting: Family Medicine

## 2012-12-17 VITALS — BP 130/88 | HR 86 | Ht 63.0 in | Wt 169.0 lb

## 2012-12-17 DIAGNOSIS — Z209 Contact with and (suspected) exposure to unspecified communicable disease: Secondary | ICD-10-CM

## 2012-12-17 DIAGNOSIS — E119 Type 2 diabetes mellitus without complications: Secondary | ICD-10-CM

## 2012-12-17 DIAGNOSIS — D869 Sarcoidosis, unspecified: Secondary | ICD-10-CM

## 2012-12-17 DIAGNOSIS — Z0000 Encounter for general adult medical examination without abnormal findings: Secondary | ICD-10-CM

## 2012-12-17 LAB — COMPREHENSIVE METABOLIC PANEL
Albumin: 4.6 g/dL (ref 3.5–5.2)
BUN: 9 mg/dL (ref 6–23)
CO2: 26 mEq/L (ref 19–32)
Calcium: 9.8 mg/dL (ref 8.4–10.5)
Chloride: 103 mEq/L (ref 96–112)
Glucose, Bld: 114 mg/dL — ABNORMAL HIGH (ref 70–99)
Potassium: 4.2 mEq/L (ref 3.5–5.3)
Total Protein: 7.7 g/dL (ref 6.0–8.3)

## 2012-12-17 LAB — LIPID PANEL
Cholesterol: 212 mg/dL — ABNORMAL HIGH (ref 0–200)
HDL: 51 mg/dL (ref >39)
Triglycerides: 138 mg/dL (ref <150)

## 2012-12-17 LAB — CBC WITH DIFFERENTIAL/PLATELET
Basophils Relative: 0 % (ref 0–1)
HCT: 41.3 % (ref 36.0–46.0)
Hemoglobin: 14 g/dL (ref 12.0–15.0)
Lymphocytes Relative: 36 % (ref 12–46)
Lymphs Abs: 1.7 10*3/uL (ref 0.7–4.0)
MCHC: 33.9 g/dL (ref 30.0–36.0)
Monocytes Absolute: 0.3 10*3/uL (ref 0.1–1.0)
Monocytes Relative: 6 % (ref 3–12)
Neutro Abs: 2.6 10*3/uL (ref 1.7–7.7)
RBC: 5.22 MIL/uL — ABNORMAL HIGH (ref 3.87–5.11)

## 2012-12-17 LAB — POCT GLYCOSYLATED HEMOGLOBIN (HGB A1C): Hemoglobin A1C: 7.5

## 2012-12-17 NOTE — Patient Instructions (Signed)
Take 2 Prilosec about hour before bedtime for the next week or so and let me know what this does for your cough.

## 2012-12-17 NOTE — Progress Notes (Signed)
Subjective:    Patient ID: Cheryl Carroll, female    DOB: [birth year], 47 y.o.   MRN: [id number]  HPI He is here for complete examination. She continues to have difficulty with a dry cough and nasal congestion that has been present for 2 months. No fever, chills, sore throat or earache. She cannot relate this to eating or position. She was diagnosed in 2012 with diabetes however did not followup due to insurance issues. She has not been checking her blood sugars regularly. She has made some minor dietary changes and is not currently exercising. Most of her meals are eaten outside the home. She is still having menstrual cycles and does have difficulty with fatigue and bloating. She recently broke up from some when she was dating and would like to be STD tested. They did not use condoms. He does have a remote history of sarcoidosis dating to her 39s. She was apparently placed on steroids and declared cleared. She has not had followup on this in several years. She also has a previous history of neck pain and apparently has cervical disc disease causing numbness in her left hand. At this point she is not interested any further potential for surgery.  Review of Systems Negative except as above    Objective:   Physical Exam BP 130/88  Pulse 86  Ht 5\' 3"  (1.6 m)  Wt 169 lb (76.658 kg)  BMI 29.94 kg/m2  SpO2 98%  General Appearance:    Alert, cooperative, no distress, appears stated age  Head:    Normocephalic, without obvious abnormality, atraumatic  Eyes:    PERRL, conjunctiva/corneas clear, EOM's intact, fundi    benign  Ears:    Normal TM's and external ear canals  Nose:   Nares normal, mucosa normal, no drainage or sinus   tenderness  Throat:   Lips, mucosa, and tongue normal; teeth and gums normal  Neck:   Supple, no lymphadenopathy;  thyroid:  no   enlargement/tenderness/nodules; no carotid   bruit or JVD  Back:    Spine nontender, no curvature, ROM normal, no CVA     tenderness  Lungs:      Clear to auscultation bilaterally without wheezes, rales or     ronchi; respirations unlabored  Chest Wall:    No tenderness or deformity   Heart:    Regular rate and rhythm, S1 and S2 normal, no murmur, rub   or gallop  Breast Exam:    Deferred to GYN  Abdomen:     Soft, non-tender, nondistended, normoactive bowel sounds,    no masses, no hepatosplenomegaly  Genitalia:    Deferred to GYN     Extremities:   No clubbing, cyanosis or edema  Pulses:   2+ and symmetric all extremities  Skin:   Skin color, texture, turgor normal, no rashes or lesions  Lymph nodes:   Cervical, supraclavicular, and axillary nodes normal  Neurologic:   CNII-XII intact, normal strength, sensation and gait; reflexes 2+ and symmetric throughout          Psych:   Normal mood, affect, hygiene and grooming.           Assessment & Plan:  Diabetes mellitus - Plan: POCT glycosylated hemoglobin (Hb A1C), POCT UA - Microalbumin, Amb Referral to [facility]  Contact with or exposure to unspecified communicable disease - Plan: GC/chlamydia probe amp, urine, RPR  Sarcoidosis - Plan: DG Chest 2 View  Routine general medical examination at a health care facility -  Plan: CBC with Differential, Comprehensive metabolic panel, Lipid panel, MM Digital Screening I will followup on the neck a later date since at this point she is not interested in having anything further done.

## 2012-12-18 LAB — RPR

## 2012-12-20 ENCOUNTER — Other Ambulatory Visit: Payer: Self-pay

## 2012-12-20 MED ORDER — PRAVASTATIN SODIUM 20 MG PO TABS: 20.0000 mg | ORAL_TABLET | Freq: Every day | ORAL | Status: DC

## 2012-12-20 NOTE — Telephone Encounter (Signed)
SENT IN [place]

## 2012-12-20 NOTE — Progress Notes (Signed)
Quick Note:  CALLED PT TO INFORM HER OF HER LABS THAT ALL LABS NORMAL EXCEPT HER CHOLESTEROL I HAVE SENT IN PRAVACHOL 20 MG TAKE ONE A DAY AND IF SHE HAS ANY PROBLEM WITH THIS MED TO PLEASE CALL LEFT WORD FOR WORD MESSAGE ______

## 2012-12-31 ENCOUNTER — Telehealth: Payer: Self-pay | Admitting: Family Medicine

## 2012-12-31 NOTE — Telephone Encounter (Signed)
PT CALLED AND REQUESTED COPY OF LABS SENT TO HER. DONE

## 2013-01-03 ENCOUNTER — Telehealth: Payer: Self-pay | Admitting: Family Medicine

## 2013-01-03 ENCOUNTER — Encounter: Payer: BC Managed Care – PPO | Attending: Family Medicine | Admitting: *Deleted

## 2013-01-03 ENCOUNTER — Encounter: Payer: Self-pay | Admitting: *Deleted

## 2013-01-03 VITALS — Ht 62.0 in | Wt 164.7 lb

## 2013-01-03 DIAGNOSIS — E119 Type 2 diabetes mellitus without complications: Secondary | ICD-10-CM | POA: Insufficient documentation

## 2013-01-03 DIAGNOSIS — Z713 Dietary counseling and surveillance: Secondary | ICD-10-CM | POA: Insufficient documentation

## 2013-01-03 MED ORDER — GLUCOSE BLOOD VI STRP: ORAL_STRIP | Status: DC

## 2013-01-03 MED ORDER — ONETOUCH LANCETS MISC: 1.0000 | Freq: Every day | Status: DC

## 2013-01-03 NOTE — Patient Instructions (Addendum)
Goals:  Follow Diabetes Meal Plan as instructed  Eat 3 meals and 2 snacks, every 3-5 hrs  Limit carbohydrate intake to 30-45 grams carbohydrate/meal  Limit carbohydrate intake to 15 grams carbohydrate/snack  Add lean protein foods to meals/snacks  Monitor glucose levels as instructed by your doctor  Aim for 30 mins of physical activity daily  Bring food record and glucose log to your next nutrition visit 

## 2013-01-03 NOTE — Telephone Encounter (Signed)
Done

## 2013-01-03 NOTE — Progress Notes (Signed)
Patient was seen on 01/03/2013 for the first of a series of three diabetes self-management courses at [facility]. Patient's most recent A1c was 7.5 % on 12/17/2012 The following learning objectives were met by the patient during this course:   Defines the role of glucose and insulin  Identifies type of diabetes and pathophysiology  Defines the diagnostic criteria for diabetes and prediabetes  States the risk factors for Type 2 Diabetes  States the symptoms of Type 2 Diabetes  Defines Type 2 Diabetes treatment goals  Defines Type 2 Diabetes treatment options  States the rationale for glucose monitoring  Identifies A1C, glucose targets, and testing times  Identifies proper sharps disposal  Defines the purpose of a diabetes food plan  Identifies carbohydrate food groups  Defines effects of carbohydrate foods on glucose levels  Identifies carbohydrate choices/grams/food labels  States benefits of physical activity and effect on glucose  Review of suggested activity guidelines  Handouts given during class include:  Type 2 Diabetes: Basics Book  My Food Plan Book  Food and Activity Log   Follow-Up Plan: Core Class 2

## 2013-01-24 DIAGNOSIS — E119 Type 2 diabetes mellitus without complications: Secondary | ICD-10-CM

## 2013-01-24 NOTE — Progress Notes (Signed)
Patient was seen on 01/24/13 for the second of a series of three diabetes self-management courses at [facility]. The following learning objectives were met by the patient during this course:   Explain basic nutrition maintenance and quality assurance  Describe causes, symptoms and treatment of hypoglycemia and hyperglycemia  Explain how to manage diabetes during illness  Describe the importance of good nutrition for health and healthy eating strategies  List strategies to follow meal plan when dining out  Describe the effects of alcohol on glucose and how to use it safely  Describe problem solving skills for day-to-day glucose challenges  Describe strategies to use when treatment plan needs to change  Identify important factors involved in successful weight loss  Describe ways to remain physically active  Describe the impact of regular activity on insulin resistance  Identify current diabetes medications, their action on blood glucose, and [pssible side effects.  Handouts given in class:  Refrigerator magnet for Sick Day Guidelines  [facility] Oral medication/insulin handout  Your patient has identified their diabetes self-care support plan as:  None indicated  Follow-Up Plan: Patient will attend the final class of the ADA Diabetes Self-Care Education.   

## 2013-01-28 ENCOUNTER — Other Ambulatory Visit: Payer: Self-pay

## 2013-02-07 ENCOUNTER — Encounter: Payer: BC Managed Care – PPO | Attending: Family Medicine

## 2013-02-07 DIAGNOSIS — Z713 Dietary counseling and surveillance: Secondary | ICD-10-CM | POA: Insufficient documentation

## 2013-02-07 DIAGNOSIS — E119 Type 2 diabetes mellitus without complications: Secondary | ICD-10-CM | POA: Insufficient documentation

## 2013-02-07 NOTE — Progress Notes (Signed)
Patient was seen on 02/07/13 for the third of a series of three diabetes self-management courses at [facility]. The following learning objectives were met by the patient during this course:    Describe how diabetes changes over time   Identify diabetes complications and ways to prevent them   Describe strategies that can promote heart health including lowering blood pressure and cholesterol   Describe strategies to lower dietary fat and sodium in the diet   Identify physical activities that benefit cardiovascular health   Describe role of stress on blood glucose and develop strategies to address psychosocial issues   Evaluate success in meeting personal goal   Describe the belief that they can live successfully with diabetes day to day   Establish 2-3 goals that they will plan to diligently work on until they return for the free 9-month follow-up visit  The following handouts were given in class:  Goal setting handout  Class evaluation form  Low-sodium seasoning tips  Stress management handout  Your patient has established the following 4 month goals for diabetes self-care:  Reduce fat in my diet by eating less fried foods  Reduce stress in life by limiting time spent with people who cause stress  Your patient has identified these potential barriers to change:  None identified  Your patient has identified their diabetes self-care support plan as:  [facility] support group   Follow-Up Plan: Patient was offered a 4 month follow-up visit for diabetes self-management education.

## 2013-03-08 ENCOUNTER — Encounter: Payer: Self-pay | Admitting: Family Medicine

## 2013-03-08 ENCOUNTER — Ambulatory Visit (INDEPENDENT_AMBULATORY_CARE_PROVIDER_SITE_OTHER): Payer: BC Managed Care – PPO | Admitting: Family Medicine

## 2013-03-08 ENCOUNTER — Ambulatory Visit: Payer: BC Managed Care – PPO | Admitting: Family Medicine

## 2013-03-08 VITALS — BP 120/80 | HR 89 | Wt 160.0 lb

## 2013-03-08 DIAGNOSIS — J069 Acute upper respiratory infection, unspecified: Secondary | ICD-10-CM

## 2013-03-08 DIAGNOSIS — J040 Acute laryngitis: Secondary | ICD-10-CM

## 2013-03-08 NOTE — Patient Instructions (Addendum)
Take 4 Advil three times a day for fever aches and pains. Voice rest meaning don't yell at  anybody. Use Robitussin-DM to help with the cough

## 2013-03-08 NOTE — Progress Notes (Signed)
  Subjective:    Patient ID: Cheryl Carroll, female    DOB: Jun 19, 1966, 47 y.o.   MRN: [id number]  HPInd  3 days ago she developed a hoarse voice,chest and nasal congestion,headache. No sore throat, earache, fever or chills. He does not smoke. She did use a breathing machine which did help her breathe better. Is been using 2 Advil per day  Review of Systems     Objective:   Physical Exam alert and in no distress. Tympanic membranes and canals are normal. Throat is clear. Tonsils are normal. Neck is supple without adenopathy or thyromegaly. Cardiac exam shows a regular sinus rhythm without murmurs or gallops. Lungs are clear to auscultation.        Assessment & Plan:  Laryngitis  URI, acute Take 4 Advil three times a day for fever aches and pains. Voice rest meaning don't yell at  anybody. Use Robitussin-DM to help with the cough

## 2013-04-18 ENCOUNTER — Encounter: Payer: Self-pay | Admitting: Family Medicine

## 2013-04-18 ENCOUNTER — Ambulatory Visit (INDEPENDENT_AMBULATORY_CARE_PROVIDER_SITE_OTHER): Payer: BC Managed Care – PPO | Admitting: Family Medicine

## 2013-04-18 VITALS — BP 130/80 | HR 84 | Wt 159.0 lb

## 2013-04-18 DIAGNOSIS — E1169 Type 2 diabetes mellitus with other specified complication: Secondary | ICD-10-CM

## 2013-04-18 DIAGNOSIS — I152 Hypertension secondary to endocrine disorders: Secondary | ICD-10-CM

## 2013-04-18 DIAGNOSIS — I10 Essential (primary) hypertension: Secondary | ICD-10-CM

## 2013-04-18 DIAGNOSIS — E119 Type 2 diabetes mellitus without complications: Secondary | ICD-10-CM

## 2013-04-18 DIAGNOSIS — E1159 Type 2 diabetes mellitus with other circulatory complications: Secondary | ICD-10-CM

## 2013-04-18 DIAGNOSIS — Z23 Encounter for immunization: Secondary | ICD-10-CM

## 2013-04-18 DIAGNOSIS — E781 Pure hyperglyceridemia: Secondary | ICD-10-CM

## 2013-04-18 DIAGNOSIS — D869 Sarcoidosis, unspecified: Secondary | ICD-10-CM

## 2013-04-18 LAB — POCT UA - MICROALBUMIN
Albumin/Creatinine Ratio, Urine, POC: 6.5
Microalbumin Ur, POC: 8.7 mg/L

## 2013-04-18 LAB — POCT GLYCOSYLATED HEMOGLOBIN (HGB A1C): Hemoglobin A1C: 6.6

## 2013-04-18 NOTE — Progress Notes (Signed)
  Subjective:    Patient ID: Cheryl Carroll, female    DOB: [birth year], 47 y.o.   MRN: [id number]  HPI She is here for an interval evaluation. She has lost 10 pounds since June. She is exercising regularly. He has been to the dietitian and has been carbohydrate counting. This is getting easier for her to do. Smoking and drinking were reviewed. She is checking her blood sugars regularly as well as her feet. Review of her record indicates she has a history of sarcoid. This was diagnosed over 20 years ago. She states she was placed on steroids and that followup since then has been negative. She did have an x-ray in December of last year which was negative She also has seen neurosurgery  However I do not have the records. The problem list indicates a neuropathy. Her work continues to go well.   Review of Systems     Objective:   Physical Exam Alert and in no distress. Hemoglobin A1c is 6.6      Assessment & Plan:  Diabetes mellitus - Plan: POCT glycosylated hemoglobin (Hb A1C), POCT UA - Microalbumin  Need for prophylactic vaccination and inoculation against influenza - Plan: Flu Vaccine QUAD 36+ mos PF IM (Fluarix)  HYPERTRIGLYCERIDEMIA  Sarcoidosis  Hypertension associated with diabetes  I could actually get her on the good work that she has done. Discussed the natural history of diabetes and strongly encourage her to continue with her present regimen. Flu shot given with risks and benefits discussed. I will get the records from neurosurgery and reevaluate.

## 2013-06-08 ENCOUNTER — Emergency Department (INDEPENDENT_AMBULATORY_CARE_PROVIDER_SITE_OTHER)
Admission: EM | Admit: 2013-06-08 | Discharge: 2013-06-08 | Disposition: A | Discharge status: Home / Self Care | Payer: BC Managed Care – PPO | Source: Home / Self Care | Attending: Emergency Medicine | Admitting: Emergency Medicine

## 2013-06-08 ENCOUNTER — Encounter (HOSPITAL_COMMUNITY): Payer: Self-pay | Admitting: Emergency Medicine

## 2013-06-08 DIAGNOSIS — H6590 Unspecified nonsuppurative otitis media, unspecified ear: Secondary | ICD-10-CM

## 2013-06-08 DIAGNOSIS — J0100 Acute maxillary sinusitis, unspecified: Secondary | ICD-10-CM

## 2013-06-08 DIAGNOSIS — J309 Allergic rhinitis, unspecified: Secondary | ICD-10-CM

## 2013-06-08 MED ORDER — CETIRIZINE-PSEUDOEPHEDRINE ER 5-120 MG PO TB12: 1.0000 | ORAL_TABLET | Freq: Every day | ORAL | Status: DC

## 2013-06-08 MED ORDER — FLUTICASONE PROPIONATE 50 MCG/ACT NA SUSP: 2.0000 | Freq: Every day | NASAL | Status: DC

## 2013-06-08 MED ORDER — AMOXICILLIN-POT CLAVULANATE 875-125 MG PO TABS: 1.0000 | ORAL_TABLET | Freq: Two times a day (BID) | ORAL | Status: DC

## 2013-06-08 NOTE — ED Notes (Signed)
C/o nasal congestion onset Monday.  She had a sore throat the first day.  C/o cough prod. of green sputum. She had a headache for the first 2 days. C/o pressure in her nose and feels congested in her face.  No chills or fever.

## 2013-06-08 NOTE — ED Provider Notes (Signed)
CSN: [id number]     Arrival date & time 06/08/13  1817 History   First MD Initiated Contact with Patient 06/08/13 1857     Chief Complaint  Patient presents with  . URI   (Consider location/radiation/quality/duration/timing/severity/associated sxs/prior Treatment)  HPI  The patient presents today with complaints of her "nose burning" and a "heavy head" with congestion and headache since Monday evening.  The patient reports a sore throat with congestion and productive green nasal drainage.  The patient states discomfort is severe and barely able to hold my "head up".   Past Medical History  Diagnosis Date  . Hyperlipidemia   . Diabetes mellitus   . Hypertension   . Sarcoidosis of lung    Past Surgical History  Procedure Laterality Date  . Tubal ligation     Family History  Problem Relation Age of Onset  . Diabetes Mother   . Hyperlipidemia Mother   . Hypertension Mother   . Hyperlipidemia Father   . Hypertension Father   . Diabetes Father    History  Substance Use Topics  . Smoking status: Never Smoker   . Smokeless tobacco: Never Used  . Alcohol Use: No   OB History   Grav Para Term Preterm Abortions TAB SAB Ect Mult Living                 Review of Systems  Constitutional: Negative.  Negative for fever and fatigue.  HENT: Positive for congestion, sinus pressure and sore throat. Negative for ear pain, facial swelling and hearing loss.        Reports "stuffy ears".  Eyes: Negative.   Respiratory: Negative.  Negative for shortness of breath and wheezing.   Cardiovascular: Negative.   Gastrointestinal: Negative.   Endocrine: Negative.   Genitourinary: Negative.   Musculoskeletal: Negative.   Skin: Negative.   Allergic/Immunologic: Negative.   Neurological: Negative.   Hematological: Negative.   Psychiatric/Behavioral: Negative.     Allergies  Review of patient's allergies indicates no known allergies.  Home Medications   Current Outpatient Rx  Name   Route  Sig  Dispense  Refill  . glucose blood test strip      Use as instructed   100 each   PRN     One Touch Ultra 2 test strips.   . ONE TOUCH LANCETS MISC   Does not apply   1 each by Does not apply route daily.   200 each   PRN     Patient has One Touch Ultra 2 meter.   . pravastatin (PRAVACHOL) 20 MG tablet   Oral   Take 1 tablet (20 mg total) by mouth daily.   30 tablet   5   . amoxicillin-clavulanate (AUGMENTIN) 875-125 MG per tablet   Oral   Take 1 tablet by mouth every 12 (twelve) hours.   14 tablet   0   . cetirizine-pseudoephedrine (ZYRTEC-D) 5-120 MG per tablet   Oral   Take 1 tablet by mouth daily.   15 tablet   0   . fluticasone (FLONASE) 50 MCG/ACT nasal spray   Nasal   Place 2 sprays into the nose daily.   16 g   2   . fluticasone (FLONASE) 50 MCG/ACT nasal spray   Each Nare   Place 2 sprays into both nostrils daily.   16 g   2    BP 151/74  Pulse 95  Temp(Src) 98.2 F (36.8 C) (Oral)  Resp 20  SpO2 100%  LMP 05/30/2013  Physical Exam  Constitutional: She is oriented to person, place, and time. She appears well-developed and well-nourished. No distress.  HENT:  Head: Normocephalic and atraumatic.    Right Ear: External ear normal.  Left Ear: External ear normal.  Mouth/Throat: Oropharynx is clear and moist.  Bilateral nasal turbinates swollen, nares patent.  TMs pearly grey with distorted light reflexes from fluid present bilaterally.   Posterior oropharynx with mucoid discharge.  No exudate or patches.   Cardiovascular: Normal rate, regular rhythm, normal heart sounds and intact distal pulses.  Exam reveals no gallop and no friction rub.   No murmur heard. Pulmonary/Chest: Effort normal and breath sounds normal. No respiratory distress. She has no wheezes. She has no rales. She exhibits no tenderness.  Neurological: She is alert and oriented to person, place, and time.  Skin: Skin is warm and dry. No rash noted. She is not  diaphoretic. No erythema. No pallor.  Psychiatric: She has a normal mood and affect. Her behavior is normal.    ED Course  Procedures (including critical care time) Labs Review Labs Reviewed - No data to display Imaging Review No results found.   MDM   1. Allergic rhinitis   2. Sinusitis, acute maxillary   3. Serous otitis media, bilateral    Meds ordered this encounter  Medications  . amoxicillin-clavulanate (AUGMENTIN) 875-125 MG per tablet    Sig: Take 1 tablet by mouth every 12 (twelve) hours.    Dispense:  14 tablet    Refill:  0  . fluticasone (FLONASE) 50 MCG/ACT nasal spray    Sig: Place 2 sprays into both nostrils daily.    Dispense:  16 g    Refill:  2  . cetirizine-pseudoephedrine (ZYRTEC-D) 5-120 MG per tablet    Sig: Take 1 tablet by mouth daily.    Dispense:  15 tablet    Refill:  0    Discussed the contribution of environmental allergies to frequent sinu infections and use of preventative measures including a neti pot for relief of symptoms.  The patient verbalizes understanding of plan of care.     Weber Cooks, NP 06/08/13 1941

## 2013-06-08 NOTE — ED Provider Notes (Signed)
Medical screening examination/treatment/procedure(s) were performed by non-physician practitioner and as supervising physician I was immediately available for consultation/collaboration.  Shenell Rogalski, M.D.  Ercell Razon C Caydee Talkington, MD 06/08/13 2101 

## 2013-06-11 ENCOUNTER — Ambulatory Visit: Payer: 59 | Admitting: *Deleted

## 2013-06-11 ENCOUNTER — Encounter: Payer: BC Managed Care – PPO | Attending: Family Medicine | Admitting: *Deleted

## 2013-06-11 VITALS — Ht 62.0 in | Wt 157.5 lb

## 2013-06-11 DIAGNOSIS — E119 Type 2 diabetes mellitus without complications: Secondary | ICD-10-CM

## 2013-06-11 DIAGNOSIS — Z713 Dietary counseling and surveillance: Secondary | ICD-10-CM | POA: Insufficient documentation

## 2013-06-11 NOTE — Progress Notes (Signed)
  Patient was seen on 06/11/13 for their 3 month follow-up as a part of the diabetes self-management courses at the [facility]. The following learning objectives were met by your patient during this course:  Patient self reports the following: A1c reported by pt 6.6% on 04/18/13 improved from 7.2% prior to class  Diabetes control has improved since diabetes self-management training: YES Number of days blood glucose is >200: 1 DAY ON THANKSGIVING Last MD appointment for diabetes: April 18, 2013 Changes in treatment plan: NOT AT THIS TIME Confidence with ability to manage diabetes: IMPROVED SINCE CAME TO CLASS Areas for improvement with diabetes self-care: WAYS TO BE ACTIVE ON COLD, DARK DAYS Willingness to participate in diabetes support group: YES  Your patient has established the following 4 month goals for diabetes self-care:  Reduce fat in my diet by eating less fried foods - EATING LESS FRIED FOODS, LESS THAN TWICE A MONTH Reduce stress in life by limiting time spent with people who cause stress - SHE STAYS OUT OF THE PETTY STUFF Your patient has identified these potential barriers to change:  None identified Your patient has identified their diabetes self-care support plan as:  [facility] support group  Please see Diabetes Flow sheet for findings related to patient's self-care.  Follow-Up Plan: Patient is eligible for a "free" 30 minute diabetes self-care appointment in the next year. Patient to call and schedule as needed.

## 2013-08-02 ENCOUNTER — Encounter: Payer: Self-pay | Admitting: Family Medicine

## 2013-08-02 ENCOUNTER — Ambulatory Visit (INDEPENDENT_AMBULATORY_CARE_PROVIDER_SITE_OTHER): Payer: BC Managed Care – PPO | Admitting: Family Medicine

## 2013-08-02 VITALS — BP 126/88 | HR 95 | Wt 158.0 lb

## 2013-08-02 DIAGNOSIS — J069 Acute upper respiratory infection, unspecified: Secondary | ICD-10-CM

## 2013-08-02 NOTE — Progress Notes (Signed)
   Subjective:    Patient ID: Cheryl Carroll, female    DOB: 1965-10-18, 48 y.o.   MRN: [id number]  HPI  she complains of a two-day history of chest congestion and coughing and slight rhinorrhea. She has a previous history of serous otitis media, trip to the urgent care. She also complains of some left-sided chest pain that is made worse with movement of her arm.   Review of Systems     Objective:   Physical Exam alert and in no distress. Tympanic membranes and canals are normal. Throat is clear. Tonsils are normal. Neck is supple without adenopathy or thyromegaly. Cardiac exam shows a regular sinus rhythm without murmurs or gallops. Lungs are clear to auscultation. No chest wall tenderness.       Assessment & Plan:  URI, acute  explained that she has a cold as well as some musculoskeletal pain. Explained that there was nothing to worry about with the musculoskeletal pain and it was gently not heart related. She verbalized understanding.

## 2013-08-20 ENCOUNTER — Ambulatory Visit: Payer: BC Managed Care – PPO | Admitting: Family Medicine

## 2013-08-27 ENCOUNTER — Ambulatory Visit (INDEPENDENT_AMBULATORY_CARE_PROVIDER_SITE_OTHER): Payer: BC Managed Care – PPO | Admitting: Family Medicine

## 2013-08-27 ENCOUNTER — Encounter: Payer: Self-pay | Admitting: Family Medicine

## 2013-08-27 VITALS — BP 138/90 | HR 80 | Wt 155.0 lb

## 2013-08-27 DIAGNOSIS — I10 Essential (primary) hypertension: Secondary | ICD-10-CM

## 2013-08-27 DIAGNOSIS — I152 Hypertension secondary to endocrine disorders: Secondary | ICD-10-CM

## 2013-08-27 DIAGNOSIS — E785 Hyperlipidemia, unspecified: Secondary | ICD-10-CM

## 2013-08-27 DIAGNOSIS — E1169 Type 2 diabetes mellitus with other specified complication: Secondary | ICD-10-CM

## 2013-08-27 DIAGNOSIS — E119 Type 2 diabetes mellitus without complications: Secondary | ICD-10-CM

## 2013-08-27 DIAGNOSIS — E1159 Type 2 diabetes mellitus with other circulatory complications: Secondary | ICD-10-CM

## 2013-08-27 MED ORDER — LISINOPRIL 10 MG PO TABS: 10.0000 mg | ORAL_TABLET | Freq: Every day | ORAL | Status: DC

## 2013-08-27 NOTE — Progress Notes (Signed)
   Subjective:    Patient ID: Cheryl Carroll, female    DOB: 20-Apr-1966, 48 y.o.   MRN: [id number]  HPI She is here for recheck. She has not been able to exercise like she would like. She has made some dietary changes. She has not been checking her sugars regularly but does check them several times per week. She continues on medications listed in the chart. Smoking and drinking were reviewed. She has had not had an eye exam recently.  Review of Systems     Objective:   Physical Exam Alert and in no distress. Hemoglobin A1c is 6.6.       Assessment & Plan:  Hypertension associated with diabetes - Plan: lisinopril (PRINIVIL,ZESTRIL) 10 MG tablet  Diabetes mellitus - Plan: HgB A1c  Hyperlipidemia LDL goal < 70  her blood pressure is up slightly and I will therefore add lisinopril to her regimen. Discussed possible side effects. Encouraged her to check her blood sugars several times per week and if they do start to become elevated, she is to check them more frequently. Also encouraged her to stay physically active and not let work get in the way of taking good care of herself.

## 2013-08-27 NOTE — Patient Instructions (Signed)
If you have difficulty with swelling or cough, call me

## 2013-08-28 LAB — POCT GLYCOSYLATED HEMOGLOBIN (HGB A1C): HEMOGLOBIN A1C: 6.6

## 2013-10-24 ENCOUNTER — Ambulatory Visit (INDEPENDENT_AMBULATORY_CARE_PROVIDER_SITE_OTHER): Payer: BC Managed Care – PPO | Admitting: Family Medicine

## 2013-10-24 ENCOUNTER — Encounter: Payer: Self-pay | Admitting: Family Medicine

## 2013-10-24 VITALS — BP 122/78 | HR 80 | Wt 156.0 lb

## 2013-10-24 DIAGNOSIS — S90122A Contusion of left lesser toe(s) without damage to nail, initial encounter: Secondary | ICD-10-CM

## 2013-10-24 DIAGNOSIS — S91115A Laceration without foreign body of left lesser toe(s) without damage to nail, initial encounter: Secondary | ICD-10-CM

## 2013-10-24 DIAGNOSIS — S90129A Contusion of unspecified lesser toe(s) without damage to nail, initial encounter: Secondary | ICD-10-CM

## 2013-10-24 DIAGNOSIS — S91109A Unspecified open wound of unspecified toe(s) without damage to nail, initial encounter: Secondary | ICD-10-CM

## 2013-10-24 NOTE — Progress Notes (Signed)
   Subjective:    Patient ID: Cheryl PlumeAngela M Leete, female    DOB: 06-Mar-1966, 48 y.o.   MRN: [id number]  HPI She injured her left fifth toe 4 days ago at home. She apparently hit a piece of furniture.   Review of Systems     Objective:   Physical Exam There is a small laceration on the plantar surface of the fifth toe at the MTP joint area. Movement of the toe does cause discomfort. There is no discoloration or deformity. X-ray shows no fracture.       Assessment & Plan:  Contusion of fifth toe of left foot  Laceration of fifth toe of left foot  recommend supportive care with anti-inflammatory of choice and a Band-Aid with Neosporin. She is to call if she has further difficulty or evidence of infection. She is set up for a followup diabetes check.

## 2013-11-01 ENCOUNTER — Telehealth: Payer: Self-pay | Admitting: Family Medicine

## 2013-11-01 NOTE — Telephone Encounter (Signed)
lmtc 11-01-13 jk

## 2013-11-01 NOTE — Telephone Encounter (Signed)
She needs to keep herself well-hydrated with things like Gatorade/poweraid and eat whatever she wants

## 2013-11-01 NOTE — Telephone Encounter (Signed)
Pt was notified and told her that she should stay well hydrated with Gatorade and power aid and then she will be able to eat what she wants. Pt states someone had called her back earlier today and told her. 11-01-2013

## 2013-12-11 ENCOUNTER — Ambulatory Visit: Payer: BC Managed Care – PPO | Admitting: *Deleted

## 2013-12-16 ENCOUNTER — Encounter: Payer: Self-pay | Admitting: Family Medicine

## 2013-12-16 ENCOUNTER — Ambulatory Visit (INDEPENDENT_AMBULATORY_CARE_PROVIDER_SITE_OTHER): Payer: BC Managed Care – PPO | Admitting: Family Medicine

## 2013-12-16 VITALS — BP 120/64 | HR 80 | Wt 156.0 lb

## 2013-12-16 DIAGNOSIS — E119 Type 2 diabetes mellitus without complications: Secondary | ICD-10-CM

## 2013-12-16 DIAGNOSIS — Z79899 Other long term (current) drug therapy: Secondary | ICD-10-CM

## 2013-12-16 DIAGNOSIS — E785 Hyperlipidemia, unspecified: Secondary | ICD-10-CM

## 2013-12-16 LAB — LIPID PANEL
Cholesterol: 177 mg/dL (ref 0–200)
HDL: 51 mg/dL (ref >39)
LDL Cholesterol: 109 mg/dL — ABNORMAL HIGH (ref 0–99)
Total CHOL/HDL Ratio: 3.5 Ratio
Triglycerides: 85 mg/dL (ref <150)
VLDL: 17 mg/dL (ref 0–40)

## 2013-12-16 NOTE — Patient Instructions (Signed)
Check your blood sugars either before a meal or 2 hours after a meal.. do something physical every day for roughly 20 minutes. You can take  Two 10 minute walks

## 2013-12-16 NOTE — Progress Notes (Signed)
   Subjective:    Patient ID: Cheryl PlumeAngela M Carroll, female    DOB: Jan 03, 1966, 48 y.o.   MRN: [id number]  HPI She is here for a diabetes recheck. She is starting to check her blood sugars. She exercises 2 or 3 days per week. She states she has made no major dietary changes. She does not smoke or drink. She has not started to have her eyes examined. She does check her feet periodically. She has been to the diabetes education program. She is now taking the statin and having no difficulty with that.   Review of Systems     Objective:   Physical Exam Alert and in no distress. Hemoglobin A1c is 7.0       Assessment & Plan:  Diabetes mellitus - Plan: HgB A1c, Lipid panel  Hyperlipidemia LDL goal < 70 - Plan: Lipid panel  Encounter for long-term (current) use of other medications - Plan: Lipid panel  encouraged her to exercise on a daily basis. Also discussed checking her blood sugars either before a meal or 2 ours after a meal and adjust accordingly. Discussed the fact that she is getting close to being put on medication and hopefully increasing her physical activity will postpone that.

## 2013-12-17 ENCOUNTER — Ambulatory Visit: Payer: BC Managed Care – PPO | Admitting: Family Medicine

## 2013-12-24 ENCOUNTER — Ambulatory Visit: Payer: BC Managed Care – PPO | Admitting: Family Medicine

## 2013-12-26 ENCOUNTER — Telehealth: Payer: Self-pay | Admitting: Family Medicine

## 2013-12-26 MED ORDER — PRAVASTATIN SODIUM 20 MG PO TABS: 20.0000 mg | ORAL_TABLET | Freq: Every day | ORAL | Status: DC

## 2013-12-27 MED ORDER — PRAVASTATIN SODIUM 20 MG PO TABS: 20.0000 mg | ORAL_TABLET | Freq: Every day | ORAL | Status: DC

## 2013-12-27 NOTE — Telephone Encounter (Signed)
Medication sent in. 

## 2014-02-05 ENCOUNTER — Telehealth: Payer: Self-pay | Admitting: Family Medicine

## 2014-02-05 ENCOUNTER — Other Ambulatory Visit: Payer: Self-pay | Admitting: Family Medicine

## 2014-02-05 MED ORDER — GLUCOSE BLOOD VI STRP: ORAL_STRIP | Status: DC

## 2014-02-05 NOTE — Telephone Encounter (Signed)
Pt needs refills on test strips, she is out. Pt's machine is one touch ultra. Pt uses [facility] on [address].

## 2014-02-05 NOTE — Telephone Encounter (Signed)
Rx refill on test strips was sent to her pharmacy. CLS

## 2014-02-10 ENCOUNTER — Telehealth: Payer: Self-pay | Admitting: Internal Medicine

## 2014-02-10 MED ORDER — GLUCOSE BLOOD VI STRP: ORAL_STRIP | Status: DC

## 2014-02-10 NOTE — Telephone Encounter (Signed)
Request refill for test strips

## 2014-02-12 LAB — HM DIABETES EYE EXAM

## 2014-02-13 ENCOUNTER — Encounter: Payer: Self-pay | Admitting: Internal Medicine

## 2014-03-27 ENCOUNTER — Telehealth: Payer: Self-pay | Admitting: Internal Medicine

## 2014-03-27 NOTE — Telephone Encounter (Signed)
I sent this to Dr. Susann Givens earlier but he hasn't responded so I am sending this to you

## 2014-03-27 NOTE — Telephone Encounter (Signed)
No, it is not normal, but it is not emergent.  Have her keep track of her bleeding/periods on a calendar, and follow up with Dr. Susann Givens if they remain irregular; sooner if having very heavy bleeding, pain, dizziness or other problems

## 2014-03-27 NOTE — Telephone Encounter (Signed)
Pt notified of Dr. Knapp recommendations 

## 2014-03-27 NOTE — Telephone Encounter (Signed)
Pt called stating that she started her period last Monday and it stopped on Saturday, however she said it started back again on Monday as a regular cycle again. She wants to know if this is normal

## 2014-03-31 ENCOUNTER — Telehealth: Payer: Self-pay | Admitting: Family Medicine

## 2014-03-31 NOTE — Telephone Encounter (Signed)
She continues to have bleeding. I will have her increase her ibuprofen to 800 mg 3 times a day and if continued difficulty, call for an appointment.

## 2014-03-31 NOTE — Telephone Encounter (Signed)
Pt still having period and this has been going on for almost three wks now. This weekend left side of lower back was hurting alot too. No dizziness which Dr Lynelle Doctor asked her to look out for but has been having headaches. Lots of clotting when she sits on the toilet. Stomach cramps.Has CPE next week but does she need to be seen before next week?

## 2014-04-09 ENCOUNTER — Encounter: Payer: Self-pay | Admitting: Family Medicine

## 2014-04-09 ENCOUNTER — Other Ambulatory Visit (HOSPITAL_COMMUNITY)
Admission: RE | Admit: 2014-04-09 | Discharge: 2014-04-09 | Disposition: A | Discharge status: Home / Self Care | Payer: BC Managed Care – PPO | Source: Ambulatory Visit | Attending: Family Medicine | Admitting: Family Medicine

## 2014-04-09 ENCOUNTER — Ambulatory Visit (INDEPENDENT_AMBULATORY_CARE_PROVIDER_SITE_OTHER): Payer: BC Managed Care – PPO | Admitting: Family Medicine

## 2014-04-09 VITALS — BP 116/80 | HR 105 | Ht 62.0 in | Wt 152.0 lb

## 2014-04-09 DIAGNOSIS — Z209 Contact with and (suspected) exposure to unspecified communicable disease: Secondary | ICD-10-CM

## 2014-04-09 DIAGNOSIS — Z862 Personal history of diseases of the blood and blood-forming organs and certain disorders involving the immune mechanism: Secondary | ICD-10-CM

## 2014-04-09 DIAGNOSIS — Z0000 Encounter for general adult medical examination without abnormal findings: Secondary | ICD-10-CM

## 2014-04-09 DIAGNOSIS — I152 Hypertension secondary to endocrine disorders: Secondary | ICD-10-CM

## 2014-04-09 DIAGNOSIS — E1169 Type 2 diabetes mellitus with other specified complication: Secondary | ICD-10-CM

## 2014-04-09 DIAGNOSIS — Z01419 Encounter for gynecological examination (general) (routine) without abnormal findings: Secondary | ICD-10-CM | POA: Insufficient documentation

## 2014-04-09 DIAGNOSIS — E119 Type 2 diabetes mellitus without complications: Secondary | ICD-10-CM

## 2014-04-09 DIAGNOSIS — I10 Essential (primary) hypertension: Secondary | ICD-10-CM

## 2014-04-09 DIAGNOSIS — E1159 Type 2 diabetes mellitus with other circulatory complications: Secondary | ICD-10-CM

## 2014-04-09 DIAGNOSIS — J069 Acute upper respiratory infection, unspecified: Secondary | ICD-10-CM

## 2014-04-09 DIAGNOSIS — Z23 Encounter for immunization: Secondary | ICD-10-CM

## 2014-04-09 DIAGNOSIS — E785 Hyperlipidemia, unspecified: Secondary | ICD-10-CM

## 2014-04-09 LAB — POCT UA - MICROALBUMIN
ALBUMIN/CREATININE RATIO, URINE, POC: 38.1
Creatinine, POC: 136.6 mg/dL
Microalbumin Ur, POC: 52 mg/L

## 2014-04-09 LAB — POCT URINALYSIS DIPSTICK
Bilirubin, UA: NEGATIVE
Blood, UA: NEGATIVE
Glucose, UA: NEGATIVE
Ketones, UA: NEGATIVE
Leukocytes, UA: NEGATIVE
NITRITE UA: NEGATIVE
PROTEIN UA: NEGATIVE
Spec Grav, UA: 1.015
UROBILINOGEN UA: NEGATIVE
pH, UA: 5

## 2014-04-09 LAB — POCT GLYCOSYLATED HEMOGLOBIN (HGB A1C): Hemoglobin A1C: 6.8

## 2014-04-09 MED ORDER — LISINOPRIL 10 MG PO TABS: 10.0000 mg | ORAL_TABLET | Freq: Every day | ORAL | Status: DC

## 2014-04-09 NOTE — Progress Notes (Signed)
Subjective:    Cheryl Carroll is a 48 y.o. female who presents for a complete examination. She does have underlying diabetes. She does have underlying diabetes  Home blood sugar records: PATIENT TEST ONE TIME A DAY LOW 80 HIGH 118  Current symptoms/problems NONE Daily foot checks:   Any foot concerns: Last eye exam:  02/12/14 [facility]   Medication compliance: Good Current diet: COUNTING CARBS Current exercise: AEROBICS Known diabetic complications: none Cardiovascular risk factors: obesity (BMI >= 30 kg/m2) She has a one-week history of dry cough, rhinorrhea, sneezing but no sore throat, earache, fever or chills. Recently she has had 2 abnormal menstrual cycles, the most recent one lasting 3 weeks. She stopped approximately one week ago. She has an underlying history of cervical disc disease and does complain of a numb sensation in her left thumb. This has been evaluated in the past. She also has had intermittent difficulty with back pain but states she gets good relief with Advil. Family and social history was reviewed. She broke up with her boyfriend several months ago and would like to be STD testing. She has a history of sarcoidosis however chest x-ray 2013 was negative.  ROS as in subjective above    Objective:    General appearence: alert, no distress, WD/WN Neck: supple, no lymphadenopathy, no thyromegaly, no masses Heart: RRR, normal S1, S2, no murmurs Lungs: CTA bilaterally, no wheezes, rhonchi, or rales Abdomen: +bs, soft, non tender, non distended, no masses, no hepatomegaly, no splenomegaly Pulses: 2+ symmetric, upper and lower extremities, normal cap refill Ext: no edema Foot exam:  Neuro: foot monofilament exam normal Pelvic exam shows no masses or tenderness.  Lab Review Lab Results  Component Value Date   HGBA1C 6.6 08/28/2013   Lab Results  Component Value Date   CHOL 177 12/16/2013   HDL 51 12/16/2013   LDLCALC 109* 12/16/2013   TRIG 85 12/16/2013    CHOLHDL 3.5 12/16/2013   No results found for this basename: [place] [place]     Chemistry      Component Value Date/Time   NA 137 12/17/2012 1109   K 4.2 12/17/2012 1109   CL 103 12/17/2012 1109   CO2 26 12/17/2012 1109   BUN 9 12/17/2012 1109   CREATININE 0.68 12/17/2012 1109   CREATININE 0.67 06/06/2012 2036      Component Value Date/Time   CALCIUM 9.8 12/17/2012 1109   ALKPHOS 37* 12/17/2012 1109   AST 16 12/17/2012 1109   ALT 13 12/17/2012 1109   BILITOT 0.3 12/17/2012 1109        Chemistry      Component Value Date/Time   NA 137 12/17/2012 1109   K 4.2 12/17/2012 1109   CL 103 12/17/2012 1109   CO2 26 12/17/2012 1109   BUN 9 12/17/2012 1109   CREATININE 0.68 12/17/2012 1109   CREATININE 0.67 06/06/2012 2036      Component Value Date/Time   CALCIUM 9.8 12/17/2012 1109   ALKPHOS 37* 12/17/2012 1109   AST 16 12/17/2012 1109   ALT 13 12/17/2012 1109   BILITOT 0.3 12/17/2012 1109     Hemoglobin A1c 6.8    Assessment:  Routine general medical examination at a health care facility - Plan: Cytology - PAP [place], CBC with Differential, Comprehensive metabolic panel, Lipid panel  H/O sarcoidosis  Hyperlipidemia with target LDL less than 70 - Plan: Lipid panel  Hypertension associated with diabetes - Plan: lisinopril (PRINIVIL,ZESTRIL) 10 MG tablet  Type 2 diabetes mellitus  without complication - Plan: POCT glycosylated hemoglobin (Hb A1C), POCT Urinalysis Dipstick, POCT UA - Microalbumin  Immunization due - Plan: Flu Vaccine QUAD 36+ mos IM, Pneumococcal conjugate vaccine 13-valent  Contact with or exposure to communicable disease - Plan: RPR, HIV antibody  Acute URI        Plan:    1.  Rx changes: none 2.  Education: Reviewed 'ABCs' of diabetes management (respective goals in parentheses):  A1C (<7), blood pressure (<130/80), and cholesterol (LDL <100). 3.  Compliance at present is estimated to be good. Efforts to improve compliance (if necessary) will be  directed at increased exercise. 4. Follow up: 4 months  I will check her lipid panel and adjust accordingly. Recommend conservative care for her URI but call me in several days if not entirely better. Continue on her other medications and encourage her to continue with her exercise and weight loss.

## 2014-04-09 NOTE — Patient Instructions (Signed)
If you're not feeling better by Monday, give me a call.

## 2014-04-10 LAB — CBC WITH DIFFERENTIAL/PLATELET
BASOS ABS: 0 10*3/uL (ref 0.0–0.1)
Basophils Relative: 0 % (ref 0–1)
Eosinophils Absolute: 0.2 10*3/uL (ref 0.0–0.7)
Eosinophils Relative: 5 % (ref 0–5)
HCT: 39.9 % (ref 36.0–46.0)
HEMOGLOBIN: 13.1 g/dL (ref 12.0–15.0)
LYMPHS PCT: 44 % (ref 12–46)
Lymphs Abs: 2.1 10*3/uL (ref 0.7–4.0)
MCH: 26 pg (ref 26.0–34.0)
MCHC: 32.8 g/dL (ref 30.0–36.0)
MCV: 79.3 fL (ref 78.0–100.0)
MONOS PCT: 7 % (ref 3–12)
Monocytes Absolute: 0.3 10*3/uL (ref 0.1–1.0)
NEUTROS ABS: 2.1 10*3/uL (ref 1.7–7.7)
Neutrophils Relative %: 44 % (ref 43–77)
Platelets: 354 10*3/uL (ref 150–400)
RBC: 5.03 MIL/uL (ref 3.87–5.11)
RDW: 16.1 % — ABNORMAL HIGH (ref 11.5–15.5)
WBC: 4.7 10*3/uL (ref 4.0–10.5)

## 2014-04-10 LAB — LIPID PANEL
Cholesterol: 172 mg/dL (ref 0–200)
HDL: 53 mg/dL (ref >39)
LDL Cholesterol: 102 mg/dL — ABNORMAL HIGH (ref 0–99)
Total CHOL/HDL Ratio: 3.2 Ratio
Triglycerides: 85 mg/dL (ref <150)
VLDL: 17 mg/dL (ref 0–40)

## 2014-04-10 LAB — COMPREHENSIVE METABOLIC PANEL
ALBUMIN: 4.6 g/dL (ref 3.5–5.2)
ALT: 12 U/L (ref 0–35)
AST: 18 U/L (ref 0–37)
Alkaline Phosphatase: 36 U/L — ABNORMAL LOW (ref 39–117)
BUN: 12 mg/dL (ref 6–23)
CALCIUM: 9.5 mg/dL (ref 8.4–10.5)
CHLORIDE: 102 meq/L (ref 96–112)
CO2: 27 mEq/L (ref 19–32)
Creat: 0.76 mg/dL (ref 0.50–1.10)
GLUCOSE: 97 mg/dL (ref 70–99)
POTASSIUM: 4.1 meq/L (ref 3.5–5.3)
SODIUM: 139 meq/L (ref 135–145)
TOTAL PROTEIN: 7.6 g/dL (ref 6.0–8.3)
Total Bilirubin: 0.5 mg/dL (ref 0.2–1.2)

## 2014-04-10 LAB — RPR

## 2014-04-10 LAB — HIV ANTIBODY (ROUTINE TESTING W REFLEX): HIV 1&2 Ab, 4th Generation: NONREACTIVE

## 2014-04-11 LAB — CYTOLOGY - PAP

## 2014-04-17 ENCOUNTER — Encounter: Payer: BC Managed Care – PPO | Admitting: Family Medicine

## 2014-04-21 ENCOUNTER — Encounter: Payer: BC Managed Care – PPO | Admitting: Family Medicine

## 2014-04-21 ENCOUNTER — Other Ambulatory Visit: Payer: Self-pay | Admitting: Family Medicine

## 2014-08-11 ENCOUNTER — Other Ambulatory Visit: Payer: Self-pay | Admitting: Family Medicine

## 2014-09-25 ENCOUNTER — Encounter: Payer: Self-pay | Admitting: Medical

## 2014-09-25 ENCOUNTER — Ambulatory Visit (INDEPENDENT_AMBULATORY_CARE_PROVIDER_SITE_OTHER): Payer: BLUE CROSS/BLUE SHIELD | Admitting: Medical

## 2014-09-25 VITALS — BP 128/88 | HR 90 | Temp 99.3°F | Resp 16 | Wt 155.0 lb

## 2014-09-25 DIAGNOSIS — R05 Cough: Secondary | ICD-10-CM

## 2014-09-25 DIAGNOSIS — R062 Wheezing: Secondary | ICD-10-CM | POA: Diagnosis not present

## 2014-09-25 DIAGNOSIS — R059 Cough, unspecified: Secondary | ICD-10-CM

## 2014-09-25 DIAGNOSIS — J0100 Acute maxillary sinusitis, unspecified: Secondary | ICD-10-CM

## 2014-09-25 MED ORDER — AMOXICILLIN-POT CLAVULANATE 875-125 MG PO TABS: 1.0000 | ORAL_TABLET | Freq: Two times a day (BID) | ORAL | Status: DC

## 2014-09-25 MED ORDER — BENZONATATE 200 MG PO CAPS: 200.0000 mg | ORAL_CAPSULE | Freq: Three times a day (TID) | ORAL | Status: DC | PRN

## 2014-09-25 MED ORDER — ALBUTEROL SULFATE HFA 108 (90 BASE) MCG/ACT IN AERS: 2.0000 | INHALATION_SPRAY | Freq: Four times a day (QID) | RESPIRATORY_TRACT | Status: DC | PRN

## 2014-09-25 NOTE — Progress Notes (Signed)
Subjective:  Cheryl Carroll is a 49 y.o. female who presents for possible sinus infection.  Symptoms include about a week of burning nose and throat, sinus pressure, headache, lots of coughing, sometimes wheezing sounding, knot feeling in throat, some ear pressure, ears burning, runny nose, some sneezing, nausea.  Denies fever, body aches, chills, no productive cough.  Past history is significant for occasional respiratory illness.  Patient is a nonsmoker.  Using sudafed PE and zyrtec for symptoms.  Denies sick contacts.  No other aggravating or relieving factors.  No other c/o.  ROS as in subjective  Past Medical History  Diagnosis Date  . Hyperlipidemia   . Diabetes mellitus   . Hypertension   . Sarcoidosis of lung     Objective: Filed Vitals:   09/25/14 1510  BP: 128/88  Pulse: 90  Temp: 99.3 F (37.4 C)  Resp: 16    General appearance: Alert, WD/WN, no distress                             Skin: warm, no rash                           Head: + maxillary sinus tenderness,                            Eyes: conjunctiva normal, corneas clear, PERRLA                            Ears: pearly TMs, external ear canals normal                          Nose: septum midline, turbinates swollen, with erythema and clear discharge             Mouth/throat: MMM, tongue normal, mild pharyngeal erythema                           Neck: supple, no adenopathy, no thyromegaly, nontender                          Heart: RRR, normal S1, S2, no murmurs                         Lungs: CTA bilaterally, no wheezes, rales, or rhonchi      Assessment and Plan: Encounter Diagnoses  Name Primary?  . Acute maxillary sinusitis, recurrence not specified Yes  . Cough   . Wheezing    Currently lungs clear.  Prescription given for Augmentin, Tesslaon perls, and if worse wheezing can use Albuterol although lungs ok currently.  Can c/t OTC mediation she is using for congestion.  Tylenol or Ibuprofen OTC for fever  and malaise.  Discussed symptomatic relief, nasal saline flush, and call or return if worse or not improving in 2-3 days.

## 2014-11-06 ENCOUNTER — Telehealth: Payer: Self-pay | Admitting: Family Medicine

## 2014-11-06 NOTE — Telephone Encounter (Signed)
Called and left detailed message on voice mail of Dr. Jola BabinskiLalonde's recommendations.

## 2014-11-06 NOTE — Telephone Encounter (Signed)
Have her take the Claritin regularly and if still having difficulty after a week or so have her set up an appointment sooner

## 2014-11-06 NOTE — Telephone Encounter (Signed)
Pt says that she is having ear & throat pain again. Pt has an appt on 6/7. She wants to know if Dr Susann GivensLalonde thinks pt will get better if she starts taking Claritin daily. Is Dr Susann GivensLalonde ok with pt trying this at least until her 6/7/ appt?

## 2014-12-09 ENCOUNTER — Ambulatory Visit: Payer: BLUE CROSS/BLUE SHIELD | Admitting: Family Medicine

## 2014-12-16 ENCOUNTER — Encounter: Payer: Self-pay | Admitting: Family Medicine

## 2014-12-16 ENCOUNTER — Ambulatory Visit (INDEPENDENT_AMBULATORY_CARE_PROVIDER_SITE_OTHER): Payer: BLUE CROSS/BLUE SHIELD | Admitting: Family Medicine

## 2014-12-16 VITALS — BP 150/90 | HR 96 | Wt 159.0 lb

## 2014-12-16 DIAGNOSIS — I10 Essential (primary) hypertension: Secondary | ICD-10-CM

## 2014-12-16 DIAGNOSIS — E041 Nontoxic single thyroid nodule: Secondary | ICD-10-CM | POA: Diagnosis not present

## 2014-12-16 DIAGNOSIS — E1169 Type 2 diabetes mellitus with other specified complication: Secondary | ICD-10-CM

## 2014-12-16 DIAGNOSIS — E119 Type 2 diabetes mellitus without complications: Secondary | ICD-10-CM | POA: Diagnosis not present

## 2014-12-16 DIAGNOSIS — E1159 Type 2 diabetes mellitus with other circulatory complications: Secondary | ICD-10-CM

## 2014-12-16 DIAGNOSIS — E785 Hyperlipidemia, unspecified: Secondary | ICD-10-CM

## 2014-12-16 DIAGNOSIS — I152 Hypertension secondary to endocrine disorders: Secondary | ICD-10-CM

## 2014-12-16 LAB — POCT GLYCOSYLATED HEMOGLOBIN (HGB A1C): HEMOGLOBIN A1C: 6.9

## 2014-12-16 NOTE — Progress Notes (Signed)
  Subjective:    Patient ID: Cheryl Carroll, female    DOB: 31-Jul-1965, 49 y.o.   MRN: [id number]  Cheryl Carroll is a 49 y.o. female who presents for follow-up of Type 2 diabetes mellitus.  Home blood sugar records: patient checks at least once a week Current symptoms/problems include frequent urination Daily foot checks: yes  Any foot concerns:none Exercise:none  Eyes:02/12/14 She also complains of a several month history of increasing difficulty with cold intolerance as well as weight gain. She has a previous history of thyroid nodule however she did not get a biopsy done. She was apparently scared of needles.She is also noted continued difficulty with headaches and tremor. The following portions of the patient's history were reviewed and updated as appropriate: allergies, current medications, past medical history, past social history and problem list.  ROS as in subjective above.     Objective:    Physical Exam Alert and in no distress, Skin is normal. DTRs normal. Neck does show a nodule present on the right.  Weight 159 lb (72.122 kg).  Lab Review Diabetic Labs Latest Ref Rng 04/09/2014 12/16/2013 08/28/2013 04/18/2013 12/17/2012  HbA1c - 6.8 - 6.6 6.6 7.5  Chol 0 - 200 mg/dL 242 353 - - 614(E)  HDL >39 mg/dL 53 51 - - 51  Calc LDL 0 - 99 mg/dL 315(Q) 008(Q) - - 761(P)  Triglycerides <150 mg/dL 85 85 - - 509  Creatinine 0.50 - 1.10 mg/dL 3.26 - - - 7.12   BP/Weight 12/16/2014 09/25/2014 04/09/2014 12/16/2013 10/24/2013  Systolic BP - 128 116 120 122  Diastolic BP - 88 80 64 78  Wt. (Lbs) 159 155 152 156 156  BMI 29.07 28.34 27.79 28.53 28.53   Foot/eye exam completion dates Latest Ref Rng 02/12/2014  Eye Exam No Retinopathy No Retinopathy  Foot Form Completion - -  Hemoglobin A1c is 6.9  [name]  reports that she has never smoked. She has never used smokeless tobacco. She reports that she does not drink alcohol or use illicit drugs.     Assessment & Plan:    1. Rx  changes: none 2. Education: Reviewed 'ABCs' of diabetes management (respective goals in parentheses):  A1C (<7), blood pressure (<130/80), and cholesterol (LDL <100). 3. Compliance at present is estimated to be good. Efforts to improve compliance (if necessary) will be directed at no change. 4. Follow up: 4 months  5. Follow-up pending any blood work and thyroid ultrasound.

## 2014-12-17 LAB — THYROID PANEL WITH TSH
Free Thyroxine Index: 1.7 (ref 1.4–3.8)
T3 Uptake: 28 % (ref 22–35)
T4, Total: 5.9 ug/dL (ref 4.5–12.0)
TSH: 1.225 u[IU]/mL (ref 0.350–4.500)

## 2014-12-18 ENCOUNTER — Ambulatory Visit (HOSPITAL_COMMUNITY): Payer: BLUE CROSS/BLUE SHIELD

## 2014-12-23 ENCOUNTER — Ambulatory Visit: Payer: Self-pay | Admitting: Family Medicine

## 2014-12-29 ENCOUNTER — Ambulatory Visit (HOSPITAL_COMMUNITY): Payer: BLUE CROSS/BLUE SHIELD | Attending: Family Medicine

## 2015-02-27 ENCOUNTER — Encounter: Payer: Self-pay | Admitting: Family Medicine

## 2015-02-27 ENCOUNTER — Ambulatory Visit: Payer: BLUE CROSS/BLUE SHIELD | Admitting: Family Medicine

## 2015-02-27 ENCOUNTER — Ambulatory Visit (INDEPENDENT_AMBULATORY_CARE_PROVIDER_SITE_OTHER): Payer: BLUE CROSS/BLUE SHIELD | Admitting: Family Medicine

## 2015-02-27 VITALS — BP 150/80 | HR 70 | Temp 100.1°F | Wt 160.0 lb

## 2015-02-27 DIAGNOSIS — J0100 Acute maxillary sinusitis, unspecified: Secondary | ICD-10-CM | POA: Diagnosis not present

## 2015-02-27 DIAGNOSIS — J3089 Other allergic rhinitis: Secondary | ICD-10-CM

## 2015-02-27 MED ORDER — AMOXICILLIN-POT CLAVULANATE 875-125 MG PO TABS: 1.0000 | ORAL_TABLET | Freq: Two times a day (BID) | ORAL | Status: DC

## 2015-02-27 NOTE — Patient Instructions (Addendum)
Take all your medicine and if not totally back to normal when you finish give me a call. Stay on your allergy meds. And take 4 Advil 3 times a day for your headache.go ahead and use the Flonase

## 2015-02-27 NOTE — Progress Notes (Signed)
   Subjective:    Patient ID: Cheryl Carroll, female    DOB: 10-10-1965, 49 y.o.   MRN: [id number]  HPI She complains of in a day history this started with nasal congestion, rhinorrhea mainly on the left but now both sides. She also said some difficulty with sinus pain and pressure with PND and coughing.She has a previous history of difficulty with this. She also has an underlying history of allergies and presently is using Zyrtec.She does not smoke.  Review of Systems     Objective:   Physical Exam Alert and in no distress. Tympanic membranes and canals are normal. Pharyngeal area is normal. Neck is supple without adenopathy or thyromegaly. Cardiac exam shows a regular sinus rhythm without murmurs or gallops. Lungs are clear to auscultation.His mucosa is swollen and slightly reddish. Tender over frontal and maxillary sinuses.        Assessment & Plan:  Acute maxillary sinusitis, recurrence not specified - Plan: amoxicillin-clavulanate (AUGMENTIN) 875-125 MG per tablet  Other allergic rhinitis She is to continue on her allergy medication. She will take all the antibiotic call me if not entirely better.

## 2015-03-06 ENCOUNTER — Other Ambulatory Visit: Payer: Self-pay | Admitting: Medical

## 2015-03-06 ENCOUNTER — Telehealth: Payer: Self-pay | Admitting: Medical

## 2015-03-06 MED ORDER — ALBUTEROL SULFATE HFA 108 (90 BASE) MCG/ACT IN AERS: 2.0000 | INHALATION_SPRAY | Freq: Four times a day (QID) | RESPIRATORY_TRACT | Status: DC | PRN

## 2015-03-06 NOTE — Telephone Encounter (Signed)
Pt informed of Shane's message

## 2015-03-06 NOTE — Telephone Encounter (Signed)
inhaler sent, but if not improving or worse, should be evaluated.  If over weekend, consider urgent care

## 2015-03-06 NOTE — Telephone Encounter (Signed)
Pt called and stated she called 2 times requesting albuterol inhaler. i looked at her chart and did not see where she has called [facility] she was very congested and thinks the inhaler would help loosen up pt seen dr Susann Givens 02-27-2015. Pt uses [facility] at [address]. Pt can be reached at [phone], please let her know when and if you send it in. Said she talked to the pharmacy and said they was waiting on dr Susann Givens,

## 2015-03-11 MED ORDER — PROAIR HFA 108 (90 BASE) MCG/ACT IN AERS: 2.0000 | INHALATION_SPRAY | Freq: Four times a day (QID) | RESPIRATORY_TRACT | Status: DC | PRN

## 2015-03-11 NOTE — Telephone Encounter (Signed)
Recv'd fax that insurance will not pay for Proventil but will pay for Proair, this was switched, pt informed

## 2015-04-08 ENCOUNTER — Ambulatory Visit: Payer: Self-pay | Admitting: Family Medicine

## 2015-04-17 ENCOUNTER — Other Ambulatory Visit: Payer: Self-pay | Admitting: Family Medicine

## 2015-05-18 ENCOUNTER — Encounter: Payer: BLUE CROSS/BLUE SHIELD | Admitting: Family Medicine

## 2015-06-02 ENCOUNTER — Encounter: Payer: BLUE CROSS/BLUE SHIELD | Admitting: Family Medicine

## 2015-07-21 ENCOUNTER — Encounter: Payer: Self-pay | Admitting: Family Medicine

## 2015-07-21 ENCOUNTER — Ambulatory Visit (INDEPENDENT_AMBULATORY_CARE_PROVIDER_SITE_OTHER): Payer: BLUE CROSS/BLUE SHIELD | Admitting: Family Medicine

## 2015-07-21 VITALS — BP 128/80 | HR 68 | Temp 98.0°F | Resp 16 | Wt 161.7 lb

## 2015-07-21 DIAGNOSIS — R059 Cough, unspecified: Secondary | ICD-10-CM

## 2015-07-21 DIAGNOSIS — R05 Cough: Secondary | ICD-10-CM | POA: Diagnosis not present

## 2015-07-21 DIAGNOSIS — J069 Acute upper respiratory infection, unspecified: Secondary | ICD-10-CM

## 2015-07-21 MED ORDER — PROAIR HFA 108 (90 BASE) MCG/ACT IN AERS: 2.0000 | INHALATION_SPRAY | Freq: Four times a day (QID) | RESPIRATORY_TRACT | Status: DC | PRN

## 2015-07-21 MED ORDER — PROMETHAZINE-DM 6.25-15 MG/5ML PO SYRP: 5.0000 mL | ORAL_SOLUTION | Freq: Every evening | ORAL | Status: DC | PRN

## 2015-07-21 MED ORDER — AZITHROMYCIN 250 MG PO TABS: ORAL_TABLET | ORAL | Status: DC

## 2015-07-21 NOTE — Progress Notes (Signed)
   Subjective:    Patient ID: Cheryl Carroll, female    DOB: January 17, 1966, 50 y.o.   MRN: [id number]  HPI Chief Complaint  Patient presents with  . cough    on going cough off and on.    She is here with complaints of intermittent nasal congestion, hoarseness, cough and wheezing for past 2 days and it seems like it is getting worse. Reports abrupt onset.  Denies fever, chills, body aches, sore throat, ear pain, nausea, vomiting.   History of bronchitis, states she gets this every this year. Reports underlying allergies- not taking any medications for this.  Denies history of asthma. States she has history of DM2 and is trying to control is with diet. States blood sugars at home are in the low 100s.  Took Mucinex one dose.  Sates she stopped taking lisinopril 2 months ago due to cough. Is not currently on medication for blood pressure    Review of Systems  pertinent positives and negatives in the history of present illness.    Objective:   Physical Exam BP 128/80 mmHg  Pulse 68  Temp(Src) 98 F (36.7 C) (Oral)  Resp 16  Wt 161 lb 11.2 oz (73.347 kg)  Alert and in no distress. No sinus tenderness. Nares red with mild edema. Tympanic membranes and canals are normal. Pharyngeal area is mildly erythematous. Neck is supple without adenopathy or thyromegaly. Cardiac exam shows a regular sinus rhythm without murmurs or gallops. Lungs with mild expiratory wheezing to RUL and LUL fields, other lung fields are clear to auscultation.       Assessment & Plan:  Acute upper respiratory infection - Plan: azithromycin (ZITHROMAX Z-PAK) 250 MG tablet, PROAIR HFA 108 (90 Base) MCG/ACT inhaler  Cough - Plan: azithromycin (ZITHROMAX Z-PAK) 250 MG tablet, promethazine-dextromethorphan (PROMETHAZINE-DM) 6.25-15 MG/5ML syrup, PROAIR HFA 108 (90 Base) MCG/ACT inhaler  Recommend that she give her illness a few more days before starting the Zithromax. Cannot determine etiology viral versus bacterial at  this point. Recommend that if she is not improving by Friday, January 20 then she should get the medication and start it. She does not have albuterol inhaler at home and states she was unable to purchase the one that was previously ordered due to financial issues. She would like an albuterol inhaler sent to her pharmacy today. Prescription sent for albuterol inhaler and cough medication. I think she would benefit from the inhaler. Also recommend treating her symptoms and staying well hydrated. Samples given for Norel AD, nasal saline mist and rhino cort. Instructions provided for all 3 medications. She will follow-up if she is not improving or if she takes the antibiotic and is not back to her baseline.

## 2015-09-03 ENCOUNTER — Telehealth: Payer: Self-pay | Admitting: Family Medicine

## 2015-09-03 NOTE — Telephone Encounter (Signed)
Pt states she uses [facility] for her diabetes supplies.  Faxed back request for diabetic supplies

## 2015-10-12 DIAGNOSIS — E119 Type 2 diabetes mellitus without complications: Secondary | ICD-10-CM | POA: Diagnosis not present

## 2015-10-12 LAB — HM DIABETES EYE EXAM

## 2015-12-03 ENCOUNTER — Encounter: Payer: Self-pay | Admitting: Family Medicine

## 2015-12-03 ENCOUNTER — Ambulatory Visit (INDEPENDENT_AMBULATORY_CARE_PROVIDER_SITE_OTHER): Payer: BLUE CROSS/BLUE SHIELD | Admitting: Family Medicine

## 2015-12-03 VITALS — BP 156/80 | HR 95 | Temp 98.7°F | Wt 163.0 lb

## 2015-12-03 DIAGNOSIS — R058 Other specified cough: Secondary | ICD-10-CM

## 2015-12-03 DIAGNOSIS — I152 Hypertension secondary to endocrine disorders: Secondary | ICD-10-CM

## 2015-12-03 DIAGNOSIS — T464X5A Adverse effect of angiotensin-converting-enzyme inhibitors, initial encounter: Secondary | ICD-10-CM

## 2015-12-03 DIAGNOSIS — J209 Acute bronchitis, unspecified: Secondary | ICD-10-CM

## 2015-12-03 DIAGNOSIS — E1159 Type 2 diabetes mellitus with other circulatory complications: Secondary | ICD-10-CM | POA: Diagnosis not present

## 2015-12-03 DIAGNOSIS — Z862 Personal history of diseases of the blood and blood-forming organs and certain disorders involving the immune mechanism: Secondary | ICD-10-CM | POA: Diagnosis not present

## 2015-12-03 DIAGNOSIS — R05 Cough: Secondary | ICD-10-CM

## 2015-12-03 DIAGNOSIS — J301 Allergic rhinitis due to pollen: Secondary | ICD-10-CM | POA: Diagnosis not present

## 2015-12-03 DIAGNOSIS — I10 Essential (primary) hypertension: Secondary | ICD-10-CM

## 2015-12-03 HISTORY — DX: Adverse effect of angiotensin-converting-enzyme inhibitors, initial encounter: T46.4X5A

## 2015-12-03 HISTORY — DX: Other specified cough: R05.8

## 2015-12-03 MED ORDER — AMOXICILLIN 875 MG PO TABS: 875.0000 mg | ORAL_TABLET | Freq: Two times a day (BID) | ORAL | Status: DC

## 2015-12-03 MED ORDER — VALSARTAN 80 MG PO TABS: 80.0000 mg | ORAL_TABLET | Freq: Every day | ORAL | Status: DC

## 2015-12-03 NOTE — Progress Notes (Signed)
Subjective:     Patient ID: Cheryl PlumeAngela M Nilson, female   DOB: 13-Jun-1966, 50 y.o.   MRN: [id number]  HPI [name] presents with a 12 day history of dry cough, nasal congestion, rhinorrhea, PND, and chest tightness.  She denies fever, SOB, sore throat, ear pain, and sinus pain today. She says she has a history of seasonal allergies that she has taken Claritin for in the past, but has not taken anything for her allergies this year.  She does not like how codeine effects her and does not think that tessalon perls have helped with her cough in the past. She is a non-smoker.  She also has a history of sarcoidosis with normal CXR in 2013.  She is no longer seeing her pulmonologist.  She was told that she was in remission.  She also stopped taking her lisinopril ~6 months ago due to development of a cough on the medication.  She did not call and let me know she stopped the blood pressure med.  Review of Systems     Objective:   Physical Exam Alert and in no distress. Tympanic membranes and canals are normal. Pharyngeal area is normal. Neck is supple without adenopathy or thyromegaly. Cardiac exam shows a regular sinus rhythm without murmurs or gallops. Lungs are clear to auscultation.  BP-- 158/80    Assessment / Plan:     ACE-inhibitor cough  Allergic rhinitis due to pollen  H/O sarcoidosis - Plan: DG Chest 2 View  Acute bronchitis, unspecified organism - Plan: amoxicillin (AMOXIL) 875 MG tablet  Hypertension associated with diabetes (HCC) - Plan: valsartan (DIOVAN) 80 MG tablet   HTN / ACE cough Will start on valsartan today for bp given ACE cough.  Sarcoidosis Since last CXR in 2013, will send her for imaging to make sure no recurrence of sarcoidosis.  Acute bronchitis / allergic rhinitis Will start on a 10 day course of amoxicillin.  She will follow up after completion of course if symptoms do not completely go away. Recommended OTC Robitussin DM for cough suppression and Nyquil at  night.  Discussed claritin use to prevent allergy symptoms in the future.   She is also to return in 1 month for a recheck on her blood pressure as well as a diabetes follow-up.

## 2015-12-03 NOTE — Patient Instructions (Signed)
Robitussin-DM during the day and try NyQuil at night

## 2015-12-11 ENCOUNTER — Ambulatory Visit
Admission: RE | Admit: 2015-12-11 | Discharge: 2015-12-11 | Disposition: A | Discharge status: Home / Self Care | Payer: BLUE CROSS/BLUE SHIELD | Source: Ambulatory Visit | Attending: Family Medicine | Admitting: Family Medicine

## 2015-12-11 DIAGNOSIS — R05 Cough: Secondary | ICD-10-CM | POA: Diagnosis not present

## 2015-12-11 DIAGNOSIS — Z862 Personal history of diseases of the blood and blood-forming organs and certain disorders involving the immune mechanism: Secondary | ICD-10-CM

## 2016-01-07 ENCOUNTER — Telehealth: Payer: Self-pay

## 2016-01-07 ENCOUNTER — Other Ambulatory Visit: Payer: Self-pay

## 2016-01-07 MED ORDER — PRAVASTATIN SODIUM 20 MG PO TABS: 20.0000 mg | ORAL_TABLET | Freq: Every day | ORAL | Status: DC

## 2016-01-07 MED ORDER — GLUCOSE BLOOD VI STRP: ORAL_STRIP | Status: DC

## 2016-01-07 MED ORDER — ONETOUCH LANCETS MISC: Status: DC

## 2016-01-07 NOTE — Telephone Encounter (Signed)
Cheryl JoinerRebecca can you cancel her appointment for this month I have sent her meds in per jcl she has appointment OCT. 2nd

## 2016-01-07 NOTE — Telephone Encounter (Signed)
Pt called the office in regards to her appt on 01/11/2016.  She states that she scheduled appt @ 2:15, and was told to come at 8:00am that morning. She states it is hard for her to make 2 trips here. With her work schedule Monday's are the best day for her and she is due to a 1 month f/u of HTN and DM per 12/03/2015 note. Next available AM appt on a Monday would be 02/01/2016 and pt has not been taking her cholesterol medication as she has ran out.  Is it ok to work her in at 11:30 on Monday? (You already have 2 physicals and 2 DM that morning) Or what do you advise?  Pt can be reached at [phone].   Thanks,  Lurena Joinerebecca

## 2016-01-07 NOTE — Telephone Encounter (Signed)
Have her get on all of her medicines including the cholesterol medicine and schedule her back in 2 months. She can come just wants and we can draw blood and call her back.

## 2016-01-08 NOTE — Telephone Encounter (Signed)
LMTCB

## 2016-01-11 ENCOUNTER — Ambulatory Visit: Payer: BLUE CROSS/BLUE SHIELD | Admitting: Family Medicine

## 2016-01-11 NOTE — Telephone Encounter (Signed)
Pt notified. /RLB  

## 2016-01-13 ENCOUNTER — Other Ambulatory Visit: Payer: Self-pay

## 2016-01-13 ENCOUNTER — Telehealth: Payer: Self-pay | Admitting: Family Medicine

## 2016-01-13 MED ORDER — BAYER CONTOUR NEXT MONITOR W/DEVICE KIT
PACK | Status: DC
Start: 2016-01-13 — End: 2016-08-08

## 2016-01-13 MED ORDER — GLUCOSE BLOOD VI STRP: ORAL_STRIP | Status: DC

## 2016-01-13 MED ORDER — BAYER MICROLET LANCETS MISC: Status: DC

## 2016-01-13 NOTE — Telephone Encounter (Signed)
Pt called and stated that her insurance company will no longer pay for the strips for the meter that she has. She needs a rx for a new meter. Per pt the kind that her insurance company will pay for is Contour Next Meter. She needs the machine and the strips sent to [facility] on [address].

## 2016-03-14 ENCOUNTER — Encounter: Payer: Self-pay | Admitting: Family Medicine

## 2016-03-14 ENCOUNTER — Ambulatory Visit (INDEPENDENT_AMBULATORY_CARE_PROVIDER_SITE_OTHER): Payer: BLUE CROSS/BLUE SHIELD | Admitting: Family Medicine

## 2016-03-14 VITALS — BP 144/80 | HR 88 | Temp 98.1°F | Wt 164.8 lb

## 2016-03-14 DIAGNOSIS — M79672 Pain in left foot: Secondary | ICD-10-CM | POA: Diagnosis not present

## 2016-03-14 NOTE — Progress Notes (Signed)
   Subjective:    Patient ID: Cheryl Carroll, female    DOB: 1965/12/01, 50 y.o.   MRN: [id number]  HPI Chief Complaint  Patient presents with  . Other    left foot sore, swollen about 1 week   no injury  numbness at top of foot at toe area.  also pain in little toe forever, getting worse   She is here with complaints of a week long history of left dorsal foot pain.  Reports some numbness at the base of her great toe, intermittently.  Denies injury. Pain is worse with weight bearing and walking and improved with rest.  Denies ankle pain or any other aches and pains.  Has not taken anything for her symptoms.  Denies fever, chills, night sweats, fatigue, nausea, vomiting.   Past Medical History:  Diagnosis Date  . Diabetes mellitus   . Hyperlipidemia   . Hypertension   . Sarcoidosis of lung (HCC)       Review of Systems Pertinent positives and negatives in the history of present illness.     Objective:   Physical Exam  Constitutional: She is oriented to person, place, and time. She appears well-developed and well-nourished. No distress.  Musculoskeletal:       Left ankle: Normal.       Left foot: Normal.  Neurological: She is alert and oriented to person, place, and time. She has normal strength. No sensory deficit. Gait normal.  Skin: Skin is warm and dry. No rash noted. No cyanosis. No pallor.  Psychiatric: She has a normal mood and affect. Her speech is normal and behavior is normal. Thought content normal.   BP (!) 144/80   Pulse 88   Temp 98.1 F (36.7 C) (Oral)   Wt 164 lb 12.8 oz (74.8 kg)   BMI 30.14 kg/m      Assessment & Plan:  Left foot pain discussed that her exam is normal and no imaging is needed.  Recommend conservative treatment such as heat, advil and wearing supportive shoes for the next 2 weeks and see if this improves symptoms. She has an appointment with Dr. Susann GivensLalonde in October and will discuss this if not improving or see me sooner if needed.

## 2016-03-14 NOTE — Patient Instructions (Signed)
Use heat, advil and good supportive shoes for the next 2 weeks and see if this helps.

## 2016-04-04 ENCOUNTER — Ambulatory Visit (INDEPENDENT_AMBULATORY_CARE_PROVIDER_SITE_OTHER): Payer: BLUE CROSS/BLUE SHIELD | Admitting: Family Medicine

## 2016-04-04 ENCOUNTER — Other Ambulatory Visit (HOSPITAL_COMMUNITY)
Admission: RE | Admit: 2016-04-04 | Discharge: 2016-04-04 | Disposition: A | Discharge status: Home / Self Care | Payer: BLUE CROSS/BLUE SHIELD | Source: Ambulatory Visit | Attending: Family Medicine | Admitting: Family Medicine

## 2016-04-04 ENCOUNTER — Encounter: Payer: Self-pay | Admitting: Family Medicine

## 2016-04-04 VITALS — BP 130/90 | HR 70 | Ht 62.5 in | Wt 163.8 lb

## 2016-04-04 DIAGNOSIS — B079 Viral wart, unspecified: Secondary | ICD-10-CM | POA: Diagnosis not present

## 2016-04-04 DIAGNOSIS — Z862 Personal history of diseases of the blood and blood-forming organs and certain disorders involving the immune mechanism: Secondary | ICD-10-CM | POA: Diagnosis not present

## 2016-04-04 DIAGNOSIS — Z23 Encounter for immunization: Secondary | ICD-10-CM

## 2016-04-04 DIAGNOSIS — Z1231 Encounter for screening mammogram for malignant neoplasm of breast: Secondary | ICD-10-CM | POA: Diagnosis not present

## 2016-04-04 DIAGNOSIS — J301 Allergic rhinitis due to pollen: Secondary | ICD-10-CM

## 2016-04-04 DIAGNOSIS — Z0000 Encounter for general adult medical examination without abnormal findings: Secondary | ICD-10-CM | POA: Diagnosis not present

## 2016-04-04 DIAGNOSIS — E1159 Type 2 diabetes mellitus with other circulatory complications: Secondary | ICD-10-CM

## 2016-04-04 DIAGNOSIS — Z01419 Encounter for gynecological examination (general) (routine) without abnormal findings: Secondary | ICD-10-CM | POA: Insufficient documentation

## 2016-04-04 DIAGNOSIS — E119 Type 2 diabetes mellitus without complications: Secondary | ICD-10-CM

## 2016-04-04 DIAGNOSIS — E1169 Type 2 diabetes mellitus with other specified complication: Secondary | ICD-10-CM

## 2016-04-04 DIAGNOSIS — I10 Essential (primary) hypertension: Secondary | ICD-10-CM | POA: Diagnosis not present

## 2016-04-04 DIAGNOSIS — L84 Corns and callosities: Secondary | ICD-10-CM

## 2016-04-04 DIAGNOSIS — Z1239 Encounter for other screening for malignant neoplasm of breast: Secondary | ICD-10-CM

## 2016-04-04 DIAGNOSIS — E785 Hyperlipidemia, unspecified: Secondary | ICD-10-CM

## 2016-04-04 DIAGNOSIS — I152 Hypertension secondary to endocrine disorders: Secondary | ICD-10-CM

## 2016-04-04 LAB — LIPID PANEL
CHOL/HDL RATIO: 5 ratio (ref <=5.0)
CHOLESTEROL: 235 mg/dL — AB (ref 125–200)
HDL: 47 mg/dL (ref >=46)
LDL Cholesterol: 161 mg/dL — ABNORMAL HIGH (ref <130)
Triglycerides: 133 mg/dL (ref <150)
VLDL: 27 mg/dL (ref <30)

## 2016-04-04 LAB — COMPREHENSIVE METABOLIC PANEL
ALT: 13 U/L (ref 6–29)
AST: 17 U/L (ref 10–35)
Albumin: 4.6 g/dL (ref 3.6–5.1)
Alkaline Phosphatase: 43 U/L (ref 33–130)
BUN: 12 mg/dL (ref 7–25)
CHLORIDE: 101 mmol/L (ref 98–110)
CO2: 25 mmol/L (ref 20–31)
Calcium: 9.5 mg/dL (ref 8.6–10.4)
Creat: 0.71 mg/dL (ref 0.50–1.05)
GLUCOSE: 121 mg/dL — AB (ref 65–99)
POTASSIUM: 4 mmol/L (ref 3.5–5.3)
Sodium: 136 mmol/L (ref 135–146)
TOTAL PROTEIN: 7.5 g/dL (ref 6.1–8.1)
Total Bilirubin: 0.3 mg/dL (ref 0.2–1.2)

## 2016-04-04 LAB — CBC WITH DIFFERENTIAL/PLATELET
BASOS ABS: 0 {cells}/uL (ref 0–200)
BASOS PCT: 0 %
EOS ABS: 144 {cells}/uL (ref 15–500)
Eosinophils Relative: 4 %
HEMATOCRIT: 41.2 % (ref 35.0–45.0)
Hemoglobin: 13.6 g/dL (ref 11.7–15.5)
Lymphocytes Relative: 42 %
Lymphs Abs: 1512 cells/uL (ref 850–3900)
MCH: 26.5 pg — AB (ref 27.0–33.0)
MCHC: 33 g/dL (ref 32.0–36.0)
MCV: 80.3 fL (ref 80.0–100.0)
MONO ABS: 288 {cells}/uL (ref 200–950)
MONOS PCT: 8 %
MPV: 10 fL (ref 7.5–12.5)
NEUTROS ABS: 1656 {cells}/uL (ref 1500–7800)
Neutrophils Relative %: 46 %
PLATELETS: 280 10*3/uL (ref 140–400)
RBC: 5.13 MIL/uL — ABNORMAL HIGH (ref 3.80–5.10)
RDW: 15.6 % — ABNORMAL HIGH (ref 11.0–15.0)
WBC: 3.6 10*3/uL — ABNORMAL LOW (ref 4.0–10.5)

## 2016-04-04 LAB — POCT UA - MICROALBUMIN
ALBUMIN/CREATININE RATIO, URINE, POC: 10.5
CREATININE, POC: 67.6 mg/dL
Microalbumin Ur, POC: 7.1 mg/L

## 2016-04-04 LAB — POCT GLYCOSYLATED HEMOGLOBIN (HGB A1C): Hemoglobin A1C: 7.3

## 2016-04-04 MED ORDER — METFORMIN HCL ER 750 MG PO TB24: 750.0000 mg | ORAL_TABLET | Freq: Every day | ORAL | 1 refills | Status: DC

## 2016-04-04 NOTE — Progress Notes (Signed)
Subjective:    Patient ID: Cheryl Carroll, female    DOB: [birth year], 50 y.o.   MRN: [id number]  Cheryl Carroll is a 50 y.o. female who presents for a complete exam and follow-up of Type 2 diabetes mellitus.  Patient is not checking home blood sugars.   Home blood sugar records:NA How often is blood sugars being checked: NA Current symptoms/problems  none Daily foot checks: yes  Any foot concerns: Listed below Last eye exam: 10/12/15 Exercise: gym several times per week. She also complains of a lesion on her right foot that she wants further evaluated. She also complains of left fifth toe discomfort especially when she wears shoes. When she does wear flip-flops, she has no trouble with that. She states that her mother was recently diagnosed with breast cancer. She has remote history of sarcoid disease but did have a recent chest x-ray which was negative. Her allergies seem to be under good control. She has a remote history of neck and back pain and was told she had herniated disc. Presently she does complain of sensory changes in the left C5 nerve root distribution but no weakness. She states that this is been going on for several years. The following portions of the patient's history were reviewed and updated as appropriate: allergies, current medications, past medical history, past social history and problem list.  ROS as in subjective above.     Objective:    Physical Exam Alert and in no distress. EOMI. Fundi not examined. Tympanic membranes and canals are normal. Pharyngeal area is normal. Neck is supple without adenopathy or thyromegaly. Cardiac exam shows a regular sinus rhythm without murmurs or gallops. Lungs are clear to auscultation. Abdominal exam shows active bowel sounds without masses or tenderness. Pelvic exam shows no masses or tenderness although the cervix was slightly friable. Breast exam negative. Exam of the right foot does show a small wart present on the lateral aspect  of the midfoot. Callus is noted on the left lateral fifth toe.  Blood pressure 130/90, pulse 70, height 5' 2.5" (1.588 m), weight 163 lb 12.8 oz (74.3 kg), last menstrual period 03/22/2016.  Lab Review Diabetic Labs Latest Ref Rng & Units 04/04/2016 12/16/2014 04/09/2014 12/16/2013 08/28/2013  HbA1c - 7.3 6.9 6.8 - 6.6  Microalbumin mg/L 7.1 - 52.0 - -  Micro/Creat Ratio - 10.5 - 38.1 - -  Chol 0 - 200 mg/dL - - 409 811 -  HDL >91 mg/dL - - 53 51 -  Calc LDL 0 - 99 mg/dL - - 478(G) 956(O) -  Triglycerides <150 mg/dL - - 85 85 -  Creatinine 0.50 - 1.10 mg/dL - - 1.30 - -   BP/Weight 04/04/2016 03/14/2016 12/03/2015 07/21/2015 02/27/2015  Systolic BP 130 144 156 128 150  Diastolic BP 90 80 80 80 80  Wt. (Lbs) 163.8 164.8 163 161.7 160  BMI 29.48 30.14 29.81 29.57 29.26   Foot/eye exam completion dates Latest Ref Rng & Units 10/12/2015 02/12/2014  Eye Exam No Retinopathy No Retinopathy No Retinopathy  Foot Form Completion - - -    Terryn  reports that she has never smoked. She has never used smokeless tobacco. She reports that she does not drink alcohol or use drugs.     Assessment & Plan:    Routine general medical examination at a health care facility - Plan: Cytology - [facility] [place]  Hypertension associated with diabetes (HCC) - Plan: CBC with Differential/Platelet, Comprehensive metabolic panel  Allergic rhinitis due  to pollen, unspecified chronicity, unspecified seasonality  Type 2 diabetes mellitus without complication, without long-term current use of insulin (HCC) - Plan: POCT UA - Microalbumin, HgB A1c, metFORMIN (GLUCOPHAGE XR) 750 MG 24 hr tablet, CBC with Differential/Platelet, Comprehensive metabolic panel, Lipid panel  H/O sarcoidosis  Need for prophylactic vaccination and inoculation against influenza - Plan: Flu Vaccine QUAD 36+ mos PF IM (Fluarix & Fluzone Quad PF)  Viral warts, unspecified type  Screening for breast cancer - Plan: MM DIGITAL SCREENING  BILATERAL  Callus of foot Discussed treatment of the wart with Compound W and instructed her on proper use of that. Also recommend she use doughnut type products to help with her callus. She will be sent for mammogram. She is not interested in having a colonoscopy. Her allergies are under good control so no further intervention needed. She will continue on her other medications.  1. Rx changes: Metformin added to her regimen 2. Education: Reviewed 'ABCs' of diabetes management (respective goals in parentheses):  A1C (<7), blood pressure (<130/80), and cholesterol (LDL <100). 3. Compliance at present is estimated to be fair. Efforts to improve compliance (if necessary) will be directed at increased exercise. 4. Follow up: 4 months

## 2016-04-05 MED ORDER — PRAVASTATIN SODIUM 40 MG PO TABS: 40.0000 mg | ORAL_TABLET | Freq: Every day | ORAL | 3 refills | Status: DC

## 2016-04-05 NOTE — Addendum Note (Signed)
Addended by: Ronnald NianLALONDE, Riki Gehring C on: 04/05/2016 09:28 AM   Modules accepted: Orders

## 2016-04-06 LAB — CYTOLOGY - PAP

## 2016-04-07 ENCOUNTER — Telehealth: Payer: Self-pay

## 2016-04-07 ENCOUNTER — Other Ambulatory Visit: Payer: Self-pay

## 2016-04-07 MED ORDER — METFORMIN HCL 500 MG PO TABS: 500.0000 mg | ORAL_TABLET | Freq: Two times a day (BID) | ORAL | 1 refills | Status: DC

## 2016-04-07 NOTE — Telephone Encounter (Signed)
Let's switch her to plain metformin 500 mg twice a day and check with pharmacist as to whether this can be broken up. If so make arrangements.

## 2016-04-07 NOTE — Telephone Encounter (Signed)
Pt states that she is taking Metformin XR and is not able to swallow the pills whole. Instructions states that she can not cut, or crush the pills. Please advise if you can call in something smaller. Trixie Rude/RLB

## 2016-04-07 NOTE — Telephone Encounter (Signed)
Have switched her to reg. metforomin 500 bid pt informed and verbalized understanding

## 2016-04-07 NOTE — Telephone Encounter (Signed)
Pt wants to know if you can switch her to something else as she couldn't swallow it today when I advised her to use it in food she asked if you could just switch instead

## 2016-04-07 NOTE — Telephone Encounter (Signed)
Have her use creamed potatoes or applesauce to help with the swallowing

## 2016-04-07 NOTE — Telephone Encounter (Signed)
Mailbox full; can't leave message. 

## 2016-05-06 ENCOUNTER — Encounter: Payer: Self-pay | Admitting: Family Medicine

## 2016-05-06 ENCOUNTER — Ambulatory Visit (INDEPENDENT_AMBULATORY_CARE_PROVIDER_SITE_OTHER): Payer: BLUE CROSS/BLUE SHIELD | Admitting: Family Medicine

## 2016-05-06 VITALS — BP 140/80 | HR 100 | Wt 163.0 lb

## 2016-05-06 DIAGNOSIS — L84 Corns and callosities: Secondary | ICD-10-CM | POA: Diagnosis not present

## 2016-05-06 NOTE — Progress Notes (Signed)
   Subjective:    Patient ID: Cheryl PlumeAngela M Woodbeck, female    DOB: [birth year], 50 y.o.   MRN: [id number]  HPI She is having some slight discomfort to in the fourth and fifth toes on the left. She also is concerned over possible swelling in her upper anterior left chest.   Review of Systems     Objective:   Physical Exam Exam of the upper chest area does show a slightly prominent left as seen joint compared to the right. It is nontender. Exam of her left foot does show a corn present between the fourth and fifth toes.       Assessment & Plan:  Corn of toe Reassured her that the lesion on her chest was a normal variation. Recommend a small spacer between the fourth and fifth toe to allow the area to heal.

## 2016-05-12 ENCOUNTER — Encounter (HOSPITAL_COMMUNITY): Payer: Self-pay

## 2016-05-12 ENCOUNTER — Emergency Department (HOSPITAL_COMMUNITY)
Admission: EM | Admit: 2016-05-12 | Discharge: 2016-05-13 | Disposition: A | Discharge status: Home / Self Care | Payer: BLUE CROSS/BLUE SHIELD | Attending: Emergency Medicine | Admitting: Emergency Medicine

## 2016-05-12 DIAGNOSIS — E119 Type 2 diabetes mellitus without complications: Secondary | ICD-10-CM | POA: Insufficient documentation

## 2016-05-12 DIAGNOSIS — Y999 Unspecified external cause status: Secondary | ICD-10-CM | POA: Insufficient documentation

## 2016-05-12 DIAGNOSIS — S86811A Strain of other muscle(s) and tendon(s) at lower leg level, right leg, initial encounter: Secondary | ICD-10-CM | POA: Insufficient documentation

## 2016-05-12 DIAGNOSIS — I10 Essential (primary) hypertension: Secondary | ICD-10-CM | POA: Diagnosis not present

## 2016-05-12 DIAGNOSIS — Y929 Unspecified place or not applicable: Secondary | ICD-10-CM | POA: Insufficient documentation

## 2016-05-12 DIAGNOSIS — Z7984 Long term (current) use of oral hypoglycemic drugs: Secondary | ICD-10-CM | POA: Diagnosis not present

## 2016-05-12 DIAGNOSIS — Y9339 Activity, other involving climbing, rappelling and jumping off: Secondary | ICD-10-CM | POA: Diagnosis not present

## 2016-05-12 DIAGNOSIS — X509XXA Other and unspecified overexertion or strenuous movements or postures, initial encounter: Secondary | ICD-10-CM | POA: Diagnosis not present

## 2016-05-12 DIAGNOSIS — Z79899 Other long term (current) drug therapy: Secondary | ICD-10-CM | POA: Diagnosis not present

## 2016-05-12 DIAGNOSIS — S8991XA Unspecified injury of right lower leg, initial encounter: Secondary | ICD-10-CM | POA: Diagnosis present

## 2016-05-12 DIAGNOSIS — S86111A Strain of other muscle(s) and tendon(s) of posterior muscle group at lower leg level, right leg, initial encounter: Secondary | ICD-10-CM

## 2016-05-12 DIAGNOSIS — S81811A Laceration without foreign body, right lower leg, initial encounter: Secondary | ICD-10-CM | POA: Diagnosis not present

## 2016-05-12 MED ORDER — NAPROXEN 250 MG PO TABS: 250.0000 mg | ORAL_TABLET | Freq: Two times a day (BID) | ORAL | 0 refills | Status: DC

## 2016-05-12 MED ORDER — ACETAMINOPHEN 325 MG PO TABS
650.0000 mg | ORAL_TABLET | Freq: Once | ORAL | Status: AC
  Administered 2016-05-12: 650 mg via ORAL
  Filled 2016-05-12: qty 2

## 2016-05-12 NOTE — ED Provider Notes (Signed)
[facility] DEPT Provider Note   CSN: [id number] Arrival date & time: 05/12/16  2106  By signing my name below, I, Emmanuella Mensah, attest that this documentation has been prepared under the direction and in the presence of Waynetta Pean, PA-C. Electronically Signed: Judithann Sauger, ED Scribe. 05/12/16. 11:08 PM.   History   Chief Complaint Chief Complaint  Patient presents with  . Leg Pain    HPI Comments: LATORA QUARRY is a 50 y.o. female who presents to the Emergency Department complaining of graudally worsening moderate right lower leg pain with mild swelling onset 30 minutes PTA. Pt reports associated limited ROM and difficulty ambulating due to the pain. She explains that she jumping up and down as she was excited when she heard a "pop" in her calf. She denies any other injuries. No alleviating factors noted. Pt states that she has tried Aleve with no relief. She has NKDA. She denies any fever, chills, vomiting, or any other symptoms.  The history is provided by the patient. No language interpreter was used.    Past Medical History:  Diagnosis Date  . Diabetes mellitus   . Hyperlipidemia   . Hypertension   . Sarcoidosis of lung [facility])     Patient Active Problem List   Diagnosis Date Noted  . Allergic rhinitis due to pollen 12/03/2015  . ACE-inhibitor cough 12/03/2015  . Diabetes mellitus ([place]) 01/27/2011  . Hyperlipidemia associated with type 2 diabetes mellitus ([place]) 12/01/2006  . H/O sarcoidosis 08/31/2006  . Hypertension associated with diabetes ([place]) 08/31/2006    Past Surgical History:  Procedure Laterality Date  . TUBAL LIGATION      OB History    No data available       Home Medications    Prior to Admission medications   Medication Sig Start Date End Date Taking? Authorizing Provider  BAYER MICROLET LANCETS lancets Use as instructed Patient is to test BID DX:E11.9 01/13/16   Denita Lung, MD  Blood Glucose Monitoring Suppl (BAYER CONTOUR  NEXT MONITOR) w/Device KIT Patient is to test BID DX:: E11.9 01/13/16   Denita Lung, MD  glucose blood (BAYER CONTOUR NEXT TEST) test strip Patient is to test BID DX::E11.9 01/13/16   Denita Lung, MD  metFORMIN (GLUCOPHAGE) 500 MG tablet Take 1 tablet (500 mg total) by mouth 2 (two) times daily with a meal. 04/07/16   Denita Lung, MD  naproxen (NAPROSYN) 250 MG tablet Take 1 tablet (250 mg total) by mouth 2 (two) times daily with a meal. 05/12/16   Waynetta Pean, PA-C  pravastatin (PRAVACHOL) 40 MG tablet Take 1 tablet (40 mg total) by mouth daily. 04/05/16   Denita Lung, MD  valsartan (DIOVAN) 80 MG tablet Take 1 tablet (80 mg total) by mouth daily. 12/03/15   Denita Lung, MD    Family History Family History  Problem Relation Age of Onset  . Diabetes Mother   . Hyperlipidemia Mother   . Hypertension Mother   . Cancer Mother 1    Breast cancer  . Hyperlipidemia Father   . Hypertension Father   . Diabetes Father     Social History Social History  Substance Use Topics  . Smoking status: Never Smoker  . Smokeless tobacco: Never Used  . Alcohol use No     Allergies   Patient has no known allergies.   Review of Systems Review of Systems  Constitutional: Negative for chills and fever.  Gastrointestinal: Negative for vomiting.  Musculoskeletal:  Positive for arthralgias.  Skin: Negative for rash and wound.  Neurological: Negative for weakness and numbness.     Physical Exam Updated Vital Signs BP 147/89 (BP Location: Left Arm)   Pulse 104   Temp 98.7 F (37.1 C) (Oral)   Resp 18   Ht '5\' 2"'  (1.575 m)   Wt 73.9 kg   SpO2 97%   BMI 29.81 kg/m   Physical Exam  Constitutional: She appears well-developed and well-nourished. No distress.  HENT:  Head: Normocephalic and atraumatic.  Eyes: Right eye exhibits no discharge. Left eye exhibits no discharge.  Cardiovascular: Normal rate, regular rhythm and intact distal pulses.   Bilateral dorsalis pedis and  posterior tibialis pulses are intact.  Pulmonary/Chest: Effort normal. No respiratory distress.  Musculoskeletal:  Tenderness to her posterior right calf over her gastrocnemius musculature. Very mild swelling. No swelling or tenderness to her right ankle. No tenderness over her Achilles tendon. Negative Thompson's test. Patient is able to flex and extend her leg at her knee without difficulty. She is able to plantar and dorsiflex her foot albeit with some pain. No bony point tenderness.  Neurological: She is alert. Coordination normal.  Skin: Skin is warm and dry. Capillary refill takes less than 2 seconds. No rash noted. She is not diaphoretic. No erythema. No pallor.  Psychiatric: She has a normal mood and affect. Her behavior is normal.  Nursing note and vitals reviewed.    ED Treatments / Results  DIAGNOSTIC STUDIES: Oxygen Saturation is 97% on RA, adequate by my interpretation.    COORDINATION OF CARE: 11:07 PM- Pt advised of plan for treatment and pt agrees. Will provide resources for Ortho follow up.    Labs (all labs ordered are listed, but only abnormal results are displayed) Labs Reviewed - No data to display  EKG  EKG Interpretation None       Radiology No results found.  Procedures Procedures (including critical care time)  Medications Ordered in ED Medications  acetaminophen (TYLENOL) tablet 650 mg (650 mg Oral Given 05/12/16 2259)     Initial Impression / Assessment and Plan / ED Course  Waynetta Pean, PA-C has reviewed the triage vital signs and the nursing notes.  Pertinent labs & imaging results that were available during my care of the patient were reviewed by me and considered in my medical decision making (see chart for details).  Clinical Course    Patient presented to the emergency department complaining of right posterior calf pain after she was jumping out of football game. She reports she is unable to ambulate due to this pain. On exam patient  is afebrile nontoxic-appearing. She is neurovascular intact. She has tenderness to her posterior right calf over her gastrocnemius musculature. Very mild swelling. No swelling or tenderness to her right ankle. No tenderness over her Achilles tendon. Negative Thompson's test. Patient is able to flex and extend her leg at her knee without difficulty. She is able to plantar and dorsiflex her foot albeit with some pain. No bony point tenderness.  I suspect either gastrocnemius muscle strain or partial tear. We'll place her in a posterior splint and have her nonweightbearing with crutches and have her follow-up with orthopedic surgery. I discussed return precautions. I advised the patient to follow-up with their primary care provider this week. I advised the patient to return to the emergency department with new or worsening symptoms or new concerns. The patient verbalized understanding and agreement with plan.    This patient was discussed  with Dr. Ellender Hose who agrees with assessment and plan.   Final Clinical Impressions(s) / ED Diagnoses   Final diagnoses:  Gastrocnemius muscle tear, right, initial encounter    New Prescriptions New Prescriptions   NAPROXEN (NAPROSYN) 250 MG TABLET    Take 1 tablet (250 mg total) by mouth 2 (two) times daily with a meal.   I personally performed the services described in this documentation, which was scribed in my presence. The recorded information has been reviewed and is accurate.      Waynetta Pean, PA-C 05/12/16 1610    Duffy Bruce, MD 05/14/16 [phone]

## 2016-05-12 NOTE — ED Notes (Signed)
Ortho tech notified of need for splint. Patient resting in bed at this time, family at bedside.

## 2016-05-12 NOTE — ED Triage Notes (Signed)
Pt reports she was jumping up and down when she heard a pop and has right leg pain since, occurred about 30 mins PTA.

## 2016-05-13 DIAGNOSIS — S8991XA Unspecified injury of right lower leg, initial encounter: Secondary | ICD-10-CM | POA: Diagnosis not present

## 2016-05-13 DIAGNOSIS — S86111A Strain of other muscle(s) and tendon(s) of posterior muscle group at lower leg level, right leg, initial encounter: Secondary | ICD-10-CM | POA: Diagnosis not present

## 2016-05-13 DIAGNOSIS — M79661 Pain in right lower leg: Secondary | ICD-10-CM | POA: Diagnosis not present

## 2016-05-13 NOTE — Progress Notes (Signed)
Orthopedic Tech Progress Note Patient Details:  Cheryl Carroll [birth year] [id number]  Ortho Devices Type of Ortho Device: Crutches, Post (short leg) splint Ortho Device/Splint Location: LLE Ortho Device/Splint Interventions: Application  Applied Post Left Leg Splint and delivered crutches with training as per Doctor orders.   Cheryl Carroll, Cheryl Carroll 05/13/2016, 1:06 AM

## 2016-05-13 NOTE — ED Notes (Signed)
Patient verbalized understanding of discharge instructions and denies any further needs or questions at this time. VS stable. Patient provided crutches (ortho tech) and ambulated with steady gait with crutch assistance. Escorted to ED entrance in wheelchair.

## 2016-05-13 NOTE — ED Notes (Signed)
Ortho at bedside.

## 2016-05-30 DIAGNOSIS — S86111D Strain of other muscle(s) and tendon(s) of posterior muscle group at lower leg level, right leg, subsequent encounter: Secondary | ICD-10-CM | POA: Diagnosis not present

## 2016-08-02 ENCOUNTER — Telehealth: Payer: Self-pay | Admitting: Internal Medicine

## 2016-08-02 NOTE — Telephone Encounter (Signed)
Left word for word message on voice mail

## 2016-08-02 NOTE — Telephone Encounter (Signed)
Pt thought she was going into menopause but her cycle has come on full force and and she had her cycle 8 days ago and then started up again and she said "its pouring out like water" and didn't know what she needed to do.

## 2016-08-02 NOTE — Telephone Encounter (Signed)
Take 800 mg of ibuprofen 3 times per day and that should help with the bleeding

## 2016-08-08 ENCOUNTER — Encounter: Payer: Self-pay | Admitting: Family Medicine

## 2016-08-08 ENCOUNTER — Ambulatory Visit (INDEPENDENT_AMBULATORY_CARE_PROVIDER_SITE_OTHER): Payer: BLUE CROSS/BLUE SHIELD | Admitting: Family Medicine

## 2016-08-08 ENCOUNTER — Other Ambulatory Visit: Payer: Self-pay

## 2016-08-08 VITALS — BP 130/80 | HR 98 | Ht 62.0 in | Wt 162.0 lb

## 2016-08-08 DIAGNOSIS — E119 Type 2 diabetes mellitus without complications: Secondary | ICD-10-CM | POA: Diagnosis not present

## 2016-08-08 DIAGNOSIS — I10 Essential (primary) hypertension: Secondary | ICD-10-CM | POA: Diagnosis not present

## 2016-08-08 DIAGNOSIS — E1169 Type 2 diabetes mellitus with other specified complication: Secondary | ICD-10-CM | POA: Diagnosis not present

## 2016-08-08 DIAGNOSIS — L84 Corns and callosities: Secondary | ICD-10-CM | POA: Diagnosis not present

## 2016-08-08 DIAGNOSIS — E785 Hyperlipidemia, unspecified: Secondary | ICD-10-CM

## 2016-08-08 DIAGNOSIS — E1159 Type 2 diabetes mellitus with other circulatory complications: Secondary | ICD-10-CM | POA: Diagnosis not present

## 2016-08-08 DIAGNOSIS — I152 Hypertension secondary to endocrine disorders: Secondary | ICD-10-CM

## 2016-08-08 LAB — LIPID PANEL
CHOLESTEROL: 214 mg/dL — AB (ref <200)
HDL: 49 mg/dL — AB (ref >50)
LDL CALC: 138 mg/dL — AB (ref <100)
TRIGLYCERIDES: 135 mg/dL (ref <150)
Total CHOL/HDL Ratio: 4.4 Ratio (ref <5.0)
VLDL: 27 mg/dL (ref <30)

## 2016-08-08 LAB — POCT GLYCOSYLATED HEMOGLOBIN (HGB A1C): Hemoglobin A1C: 7.1

## 2016-08-08 MED ORDER — GLUCOSE BLOOD VI STRP: ORAL_STRIP | 3 refills | Status: DC

## 2016-08-08 MED ORDER — ONETOUCH DELICA LANCETS FINE MISC: 3 refills | Status: DC

## 2016-08-08 NOTE — Progress Notes (Signed)
  Subjective:    Patient ID: Cheryl PlumeAngela M Carroll, female    DOB: April 27, 1966, 51 y.o.   MRN: [id number]  Cheryl Plumengela M Alcala is a 51 y.o. female who presents for follow-up of Type 2 diabetes mellitus.  Patient is checking home blood sugars.   Home blood sugar records: 120's How often is blood sugars being checked: once a day Current symptoms/problems none Daily foot checks: yes   Any foot concerns: pain in toe in left foot Last eye exam: 10/12/15 Exercise: not much due to legs. She has had difficulty with right calf strain and was in a splint for approximately 3 weeks. She is doing better now. Approximately 2 weeks ago she did have a popping sensation in the left calf area but this is feeling much better. She is having difficulty with a corn on the lateral aspect of the left fifth toe.  The following portions of the patient's history were reviewed and updated as appropriate: allergies, current medications, past medical history, past social history and problem list.  ROS as in subjective above.     Objective:    Physical Exam Alert and in no distress A corn noted over the lateral aspect of the left great toe.  Lab Review Diabetic Labs Latest Ref Rng & Units 04/04/2016 12/16/2014 04/09/2014 12/16/2013 08/28/2013  HbA1c - 7.3 6.9 6.8 - 6.6  Microalbumin mg/L 7.1 - 52.0 - -  Micro/Creat Ratio - 10.5 - 38.1 - -  Chol 125 - 200 mg/dL 409(W235(H) - 119172 147177 -  HDL >=46 mg/dL 47 - 53 51 -  Calc LDL <130 mg/dL 829(F161(H) - 621(H102(H) 086(V109(H) -  Triglycerides <150 mg/dL 784133 - 85 85 -  Creatinine 0.50 - 1.05 mg/dL 6.960.71 - 2.950.76 - -   BP/Weight 05/12/2016 05/06/2016 04/04/2016 03/14/2016 12/03/2015  Systolic BP 143 140 130 144 156  Diastolic BP 72 80 90 80 80  Wt. (Lbs) 163 163 163.8 164.8 163  BMI 29.81 29.34 29.48 30.14 29.81   Foot/eye exam completion dates Latest Ref Rng & Units 10/12/2015 02/12/2014  Eye Exam No Retinopathy No Retinopathy No Retinopathy  Foot Form Completion - - -   1C is 7.1 Cheryl Carroll  reports that she  has never smoked. She has never used smokeless tobacco. She reports that she does not drink alcohol or use drugs.     Assessment & Plan:    Type 2 diabetes mellitus without complication, without long-term current use of insulin (HCC) - Plan: HgB A1c  Corn of toe  Hypertension associated with diabetes (HCC)  Hyperlipidemia associated with type 2 diabetes mellitus (HCC) - Plan: Lipid panel   1. Rx changes: none 2. Education: Reviewed 'ABCs' of diabetes management (respective goals in parentheses):  A1C (<7), blood pressure (<130/80), and cholesterol (LDL <100). 3. Compliance at present is estimated to be fair. Efforts to improve compliance (if necessary) will be directed at increased exercise. 4. Follow up: 4 months  Recommend increased physical activity. Also discussed using a doughnut for the corn as well as using wider fitting shoes.

## 2016-08-10 ENCOUNTER — Other Ambulatory Visit: Payer: Self-pay

## 2016-08-10 MED ORDER — ROSUVASTATIN CALCIUM 20 MG PO TABS: 20.0000 mg | ORAL_TABLET | Freq: Every day | ORAL | 3 refills | Status: DC

## 2016-11-07 ENCOUNTER — Ambulatory Visit (INDEPENDENT_AMBULATORY_CARE_PROVIDER_SITE_OTHER): Payer: BLUE CROSS/BLUE SHIELD | Admitting: Family Medicine

## 2016-11-07 ENCOUNTER — Encounter: Payer: Self-pay | Admitting: Family Medicine

## 2016-11-07 VITALS — BP 138/84 | HR 79 | Temp 98.1°F | Wt 165.0 lb

## 2016-11-07 DIAGNOSIS — M79641 Pain in right hand: Secondary | ICD-10-CM | POA: Diagnosis not present

## 2016-11-07 DIAGNOSIS — M25441 Effusion, right hand: Secondary | ICD-10-CM | POA: Diagnosis not present

## 2016-11-07 MED ORDER — AMOXICILLIN-POT CLAVULANATE 875-125 MG PO TABS: 1.0000 | ORAL_TABLET | Freq: Two times a day (BID) | ORAL | 0 refills | Status: DC

## 2016-11-07 NOTE — Progress Notes (Signed)
   Subjective:    Patient ID: Cheryl Carroll, female    DOB: 1965-10-21, 51 y.o.   MRN: [id number]  HPI Chief Complaint  Patient presents with  . right hand pain    raight pinky finger pain. not sure what happen   She is here with complaints of 2 day history of pain and swelling to her right hand and pinky finger. States initially she felt like something was stuck in her hand, like a splinter, so she stuck a needle in her hand to see. States redness started last night and swelling was much worse this morning.  No known injury or insect bite.  History of pain to same area a few months ago but states she did not get it checked out.  Denies any other joint pain.   Denies history of gout.  Denies fever, chills, nausea, vomiting, diarrhea.   Reviewed allergies, medications, past medical, surgical, family, and social history.   Review of Systems Pertinent positives and negatives in the history of present illness.     Objective:   Physical Exam  Constitutional: She is oriented to person, place, and time. She appears well-developed and well-nourished. No distress.  Musculoskeletal:       Right wrist: Normal.       Left wrist: Normal.       Right hand: She exhibits decreased range of motion, tenderness and swelling. Normal sensation noted. Normal strength noted.       Left hand: Normal.  Erythema, edema, and tenderness to right hypothenar area and 5th digit of right hand.  Cap refill, sensation and pulses are normal. Decreased ROM to 5th digit.   Neurological: She is alert and oriented to person, place, and time.  Skin: Skin is warm and dry.   BP 138/84   Pulse 79   Temp 98.1 F (36.7 C) (Oral)   Wt 165 lb (74.8 kg)   BMI 30.18 kg/m       Assessment & Plan:  Right hand pain - Plan: Ambulatory referral to Hand Surgery  Swelling of finger joint of right hand - Plan: Ambulatory referral to Hand Surgery  Questionable early cellulitis to palmar surface. Plan to send her to hand  specialist. Dr. Susann GivensLalonde also agrees with plan of care. Martie LeeSabrina, CMA, attempted to get patient an appointment at multiple offices.  Unable to get patient an appointment for today. She has [facility]. Will cover her with an antibiotic and strict precautions to go to the ED if she has fever, worsening pain, edema or any signs of worsening infection. She verbalized understanding. She may take 4 Advil three times daily with food for pain.

## 2016-11-07 NOTE — Patient Instructions (Addendum)
Take 4 Advil three times per day.  Start the antibiotic today.   Go to the hand specialist as scheduled in the morning. If you get worse then you may have to go to the emergency department as discussed.

## 2016-11-08 ENCOUNTER — Encounter: Payer: Self-pay | Admitting: Family Medicine

## 2016-11-08 DIAGNOSIS — M79641 Pain in right hand: Secondary | ICD-10-CM | POA: Diagnosis not present

## 2016-12-05 ENCOUNTER — Ambulatory Visit: Payer: BLUE CROSS/BLUE SHIELD | Admitting: Family Medicine

## 2016-12-12 ENCOUNTER — Ambulatory Visit (INDEPENDENT_AMBULATORY_CARE_PROVIDER_SITE_OTHER): Payer: BLUE CROSS/BLUE SHIELD | Admitting: Family Medicine

## 2016-12-12 ENCOUNTER — Encounter: Payer: Self-pay | Admitting: Family Medicine

## 2016-12-12 VITALS — BP 130/80 | HR 80 | Resp 18 | Ht 62.0 in | Wt 164.0 lb

## 2016-12-12 DIAGNOSIS — I152 Hypertension secondary to endocrine disorders: Secondary | ICD-10-CM

## 2016-12-12 DIAGNOSIS — I10 Essential (primary) hypertension: Secondary | ICD-10-CM | POA: Diagnosis not present

## 2016-12-12 DIAGNOSIS — E119 Type 2 diabetes mellitus without complications: Secondary | ICD-10-CM | POA: Diagnosis not present

## 2016-12-12 DIAGNOSIS — E785 Hyperlipidemia, unspecified: Secondary | ICD-10-CM

## 2016-12-12 DIAGNOSIS — E1169 Type 2 diabetes mellitus with other specified complication: Secondary | ICD-10-CM | POA: Diagnosis not present

## 2016-12-12 DIAGNOSIS — E1159 Type 2 diabetes mellitus with other circulatory complications: Secondary | ICD-10-CM | POA: Diagnosis not present

## 2016-12-12 LAB — POCT GLYCOSYLATED HEMOGLOBIN (HGB A1C): Hemoglobin A1C: 7.7

## 2016-12-12 NOTE — Progress Notes (Signed)
  Subjective:    Patient ID: Cheryl Carroll, female    DOB: [birth year], 51 y.o.   MRN: [id number]  Cheryl Carroll is a 51 y.o. female who presents for follow-up of Type 2 diabetes mellitus.  Home blood sugar records: fasting range: 120 Current symptoms/problems include none and have been unchanged. Daily foot checks:   Any foot concerns: none Exercise: The patient does not participate in regular exercise at present. Diet:Regular with sewed on a daily basis. She continues on Crestor and is having no aches and pains. Is also using metformin and does not complain of any GI symptoms. Presently she is also taking Diovan for her blood pressure The following portions of the patient's history were reviewed and updated as appropriate: allergies, current medications, past medical history, past social history and problem list.  ROS as in subjective above.     Objective:    Physical Exam Alert and in no distress otherwise not examined.  Blood pressure 130/80, pulse 80, resp. rate 18, height 5\' 2"  (1.575 m), weight 164 lb (74.4 kg), SpO2 93 %.  Lab Review Diabetic Labs Latest Ref Rng & Units 08/08/2016 04/04/2016 12/16/2014 04/09/2014 12/16/2013  HbA1c - 7.1 7.3 6.9 6.8 -  Microalbumin mg/L - 7.1 - 52.0 -  Micro/Creat Ratio - - 10.5 - 38.1 -  Chol <200 mg/dL 409(W214(H) 119(J235(H) - 478172 295177  HDL >50 mg/dL 62(Z49(L) 47 - 53 51  Calc LDL <100 mg/dL 308(M138(H) 578(I161(H) - 696(E102(H) 109(H)  Triglycerides <150 mg/dL [id number] - 85 85  Creatinine 0.50 - 1.05 mg/dL - 3.240.71 - 4.010.76 -   BP/Weight 12/12/2016 11/07/2016 08/08/2016 05/12/2016 05/06/2016  Systolic BP 130 138 130 143 140  Diastolic BP 80 84 80 72 80  Wt. (Lbs) 164 165 162 163 163  BMI 30 30.18 29.63 29.81 29.34   Foot/eye exam completion dates Latest Ref Rng & Units 10/12/2015 02/12/2014  Eye Exam No Retinopathy No Retinopathy No Retinopathy  Foot Form Completion - - -   A1c is 7.7 Cheryl Landngela  reports that she has never smoked. She has never used smokeless tobacco. She  reports that she does not drink alcohol or use drugs.     Assessment & Plan:    Hypertension associated with diabetes (HCC)  Type 2 diabetes mellitus without complication, without long-term current use of insulin (HCC)  Hyperlipidemia associated with type 2 diabetes mellitus (HCC)   1. Rx changes: none 2. Education: Reviewed 'ABCs' of diabetes management (respective goals in parentheses):  A1C (<7), blood pressure (<130/80), and cholesterol (LDL <100). 3. Compliance at present is estimated to be fair. Efforts to improve compliance (if necessary) will be directed at increased exercise. Recommended 20 minutes of something physical on a daily basis. 4. Also discussed cutting back on her sodas as she is drinking at least 1 can per day. Discussed carbohydrates in general with her and recommended cutting back on all carbohydrates. Also discussed the need for her check her blood sugars 2 hours after meals to get a better feel for her eating habits. 5. Follow up: 4 months

## 2016-12-12 NOTE — Patient Instructions (Signed)
Cut out all soda

## 2016-12-17 ENCOUNTER — Other Ambulatory Visit: Payer: Self-pay | Admitting: Family Medicine

## 2016-12-17 DIAGNOSIS — I10 Essential (primary) hypertension: Principal | ICD-10-CM

## 2016-12-17 DIAGNOSIS — I152 Hypertension secondary to endocrine disorders: Secondary | ICD-10-CM

## 2016-12-17 DIAGNOSIS — E1159 Type 2 diabetes mellitus with other circulatory complications: Secondary | ICD-10-CM

## 2017-01-30 ENCOUNTER — Other Ambulatory Visit: Payer: Self-pay

## 2017-01-30 ENCOUNTER — Telehealth: Payer: Self-pay | Admitting: Internal Medicine

## 2017-01-30 MED ORDER — LOSARTAN POTASSIUM 50 MG PO TABS: 50.0000 mg | ORAL_TABLET | Freq: Every day | ORAL | 0 refills | Status: DC

## 2017-01-30 NOTE — Telephone Encounter (Signed)
Pt informed and verbalized understanding

## 2017-01-30 NOTE — Telephone Encounter (Signed)
Pt called and states that she received a letter from insurance stating valsartan 80mg  was on recall and needed to call pcp to get switched to a different med. She stopped taking the med over the weekend. Please send new rx to [facility] [address]

## 2017-01-30 NOTE — Telephone Encounter (Signed)
She will need a follow-up appointment in one month

## 2017-02-23 ENCOUNTER — Telehealth: Payer: Self-pay | Admitting: Family Medicine

## 2017-02-23 NOTE — Telephone Encounter (Signed)
ok 

## 2017-02-23 NOTE — Telephone Encounter (Signed)
Patient called and wants to know if she can wait until her October 8th diabetes follow up appointment to follow up on new bp meds  She doesn't want to have to come in twice and pay 2 copays and miss work twice  Please advise patient and per pt ok to leave message on voice mail

## 2017-02-27 NOTE — Telephone Encounter (Signed)
Pt informed and I canceled her sept appointment

## 2017-03-14 ENCOUNTER — Ambulatory Visit: Payer: Self-pay | Admitting: Family Medicine

## 2017-04-04 ENCOUNTER — Encounter: Payer: Self-pay | Admitting: Family Medicine

## 2017-04-04 ENCOUNTER — Ambulatory Visit (INDEPENDENT_AMBULATORY_CARE_PROVIDER_SITE_OTHER): Payer: BLUE CROSS/BLUE SHIELD | Admitting: Family Medicine

## 2017-04-04 ENCOUNTER — Ambulatory Visit: Payer: BLUE CROSS/BLUE SHIELD | Admitting: Family Medicine

## 2017-04-04 VITALS — BP 136/88 | HR 76 | Wt 157.8 lb

## 2017-04-04 DIAGNOSIS — Z1231 Encounter for screening mammogram for malignant neoplasm of breast: Secondary | ICD-10-CM | POA: Diagnosis not present

## 2017-04-04 DIAGNOSIS — E1169 Type 2 diabetes mellitus with other specified complication: Secondary | ICD-10-CM

## 2017-04-04 DIAGNOSIS — E1159 Type 2 diabetes mellitus with other circulatory complications: Secondary | ICD-10-CM

## 2017-04-04 DIAGNOSIS — Z23 Encounter for immunization: Secondary | ICD-10-CM | POA: Diagnosis not present

## 2017-04-04 DIAGNOSIS — E785 Hyperlipidemia, unspecified: Secondary | ICD-10-CM

## 2017-04-04 DIAGNOSIS — I152 Hypertension secondary to endocrine disorders: Secondary | ICD-10-CM

## 2017-04-04 DIAGNOSIS — E119 Type 2 diabetes mellitus without complications: Secondary | ICD-10-CM

## 2017-04-04 DIAGNOSIS — I10 Essential (primary) hypertension: Secondary | ICD-10-CM

## 2017-04-04 DIAGNOSIS — Z1239 Encounter for other screening for malignant neoplasm of breast: Secondary | ICD-10-CM

## 2017-04-04 LAB — LIPID PANEL
CHOL/HDL RATIO: 2.4 (calc) (ref <5.0)
CHOLESTEROL: 135 mg/dL (ref <200)
HDL: 56 mg/dL (ref >50)
LDL Cholesterol (Calc): 60 mg/dL (calc)
Non-HDL Cholesterol (Calc): 79 mg/dL (calc) (ref <130)
Triglycerides: 103 mg/dL (ref <150)

## 2017-04-04 LAB — CBC WITH DIFFERENTIAL/PLATELET
BASOS PCT: 0.5 %
Basophils Absolute: 20 cells/uL (ref 0–200)
Eosinophils Absolute: 140 cells/uL (ref 15–500)
Eosinophils Relative: 3.5 %
HEMATOCRIT: 42.1 % (ref 35.0–45.0)
HEMOGLOBIN: 13.7 g/dL (ref 11.7–15.5)
LYMPHS ABS: 1992 {cells}/uL (ref 850–3900)
MCH: 25.2 pg — ABNORMAL LOW (ref 27.0–33.0)
MCHC: 32.5 g/dL (ref 32.0–36.0)
MCV: 77.4 fL — AB (ref 80.0–100.0)
MPV: 10.3 fL (ref 7.5–12.5)
Monocytes Relative: 8.7 %
NEUTROS PCT: 37.5 %
Neutro Abs: 1500 cells/uL (ref 1500–7800)
Platelets: 318 10*3/uL (ref 140–400)
RBC: 5.44 10*6/uL — AB (ref 3.80–5.10)
RDW: 15.6 % — ABNORMAL HIGH (ref 11.0–15.0)
Total Lymphocyte: 49.8 %
WBC: 4 10*3/uL (ref 3.8–10.8)
WBCMIX: 348 {cells}/uL (ref 200–950)

## 2017-04-04 LAB — COMPREHENSIVE METABOLIC PANEL
AG Ratio: 1.6 (calc) (ref 1.0–2.5)
ALBUMIN MSPROF: 4.8 g/dL (ref 3.6–5.1)
ALKALINE PHOSPHATASE (APISO): 40 U/L (ref 33–130)
ALT: 14 U/L (ref 6–29)
AST: 18 U/L (ref 10–35)
BUN: 13 mg/dL (ref 7–25)
CALCIUM: 9.9 mg/dL (ref 8.6–10.4)
CHLORIDE: 105 mmol/L (ref 98–110)
CO2: 26 mmol/L (ref 20–32)
Creat: 0.75 mg/dL (ref 0.50–1.05)
GLOBULIN: 3 g/dL (ref 1.9–3.7)
Glucose, Bld: 97 mg/dL (ref 65–99)
POTASSIUM: 4.1 mmol/L (ref 3.5–5.3)
Sodium: 140 mmol/L (ref 135–146)
Total Bilirubin: 0.3 mg/dL (ref 0.2–1.2)
Total Protein: 7.8 g/dL (ref 6.1–8.1)

## 2017-04-04 LAB — POCT UA - MICROALBUMIN
ALBUMIN/CREATININE RATIO, URINE, POC: 5
CREATININE, POC: 21 mg/dL
Microalbumin Ur, POC: 23.8 mg/L

## 2017-04-04 LAB — POCT GLYCOSYLATED HEMOGLOBIN (HGB A1C): Hemoglobin A1C: 7.5

## 2017-04-04 MED ORDER — METFORMIN HCL 500 MG PO TABS: 500.0000 mg | ORAL_TABLET | Freq: Two times a day (BID) | ORAL | 1 refills | Status: DC

## 2017-04-04 MED ORDER — LOSARTAN POTASSIUM-HCTZ 50-12.5 MG PO TABS: 1.0000 | ORAL_TABLET | Freq: Every day | ORAL | 3 refills | Status: DC

## 2017-04-04 MED ORDER — ROSUVASTATIN CALCIUM 20 MG PO TABS: 20.0000 mg | ORAL_TABLET | Freq: Every day | ORAL | 3 refills | Status: DC

## 2017-04-04 NOTE — Progress Notes (Signed)
  Subjective:    Patient ID: Cheryl Carroll, female    DOB: 06/17/1966, 51 y.o.   MRN: [id number]  Cheryl Carroll is a 52 y.o. female who presents for follow-up of Type 2 diabetes mellitus.  Patient  is checking home blood sugars.   Home blood sugar records: yes How often is blood sugars being checked: once a day Current symptoms/problems include none and have been unchanged. Daily foot checks: yes  Any foot concerns: yes toe pain  Last eye exam: 04/2016 Exercise: yes2 or 3 days per week. She has made some dietary changes and has lost several pounds.She continues on Crestor without difficulty. She is now taking losartan and having no difficulty with that. Continues on metformin. The following portions of the patient's history were reviewed and updated as appropriate: allergies, current medications, past medical history, past social history and problem list.  ROS as in subjective above.     Objective:    Physical Exam Alert and in no distress .Foot exam recorded and is normal   Lab Review Diabetic Labs Latest Ref Rng & Units 12/12/2016 08/08/2016 04/04/2016 12/16/2014 04/09/2014  HbA1c - 7.7 7.1 7.3 6.9 6.8  Microalbumin mg/L - - 7.1 - 52.0  Micro/Creat Ratio - - - 10.5 - 38.1  Chol <200 mg/dL - 409(W) 119(J) - 478  HDL >50 mg/dL - 29(F) 47 - 53  Calc LDL <100 mg/dL - 621(H) 086(V) - 784(O)  Triglycerides <150 mg/dL - 962 952 - 85  Creatinine 0.50 - 1.05 mg/dL - - 8.41 - 3.24   BP/Weight 12/12/2016 11/07/2016 08/08/2016 05/12/2016 05/06/2016  Systolic BP 130 138 130 143 140  Diastolic BP 80 84 80 72 80  Wt. (Lbs) 164 165 162 163 163  BMI 30 30.18 29.63 29.81 29.34   Foot/eye exam completion dates Latest Ref Rng & Units 10/12/2015 02/12/2014  Eye Exam No Retinopathy No Retinopathy No Retinopathy  Foot Form Completion - - -  Hemoglobin A1c is 7.5      Assessment & Plan:    Screening for breast cancer - Plan: MM DIGITAL SCREENING BILATERAL  Type 2 diabetes mellitus without  complication, without long-term current use of insulin (HCC) - Plan: CBC with Differential/Platelet, Comprehensive metabolic panel, Lipid panel, POCT UA - Microalbumin  Hyperlipidemia associated with type 2 diabetes mellitus (HCC) - Plan: Lipid panel  Hypertension associated with diabetes (HCC) - Plan: losartan-hydrochlorothiazide (HYZAAR) 50-12.5 MG tablet, CBC with Differential/Platelet, Comprehensive metabolic panel   1. Rx changes: She will be switched to Hyzaar as she has not adequate blood pressure control 2. Education: Reviewed 'ABCs' of diabetes management (respective goals in parentheses):  A1C (<7), blood pressure (<130/80), and cholesterol (LDL <100). 3. Compliance at present is estimated to be good. Efforts to improve compliance (if necessary) will be directed at No change. 4. Follow up: 4 months  colonoscopy recommended however she is not interested.  I complimented her on her weight loss and strongly encouraged her to continue with this.

## 2017-04-10 ENCOUNTER — Ambulatory Visit: Payer: BLUE CROSS/BLUE SHIELD | Admitting: Family Medicine

## 2017-04-21 ENCOUNTER — Ambulatory Visit (INDEPENDENT_AMBULATORY_CARE_PROVIDER_SITE_OTHER): Payer: BLUE CROSS/BLUE SHIELD | Admitting: Medical

## 2017-04-21 ENCOUNTER — Encounter: Payer: Self-pay | Admitting: Medical

## 2017-04-21 VITALS — BP 132/68 | HR 93 | Temp 98.0°F | Wt 159.2 lb

## 2017-04-21 DIAGNOSIS — J0100 Acute maxillary sinusitis, unspecified: Secondary | ICD-10-CM

## 2017-04-21 DIAGNOSIS — R05 Cough: Secondary | ICD-10-CM

## 2017-04-21 DIAGNOSIS — R059 Cough, unspecified: Secondary | ICD-10-CM

## 2017-04-21 MED ORDER — AMOXICILLIN 500 MG PO TABS: ORAL_TABLET | ORAL | 0 refills | Status: DC

## 2017-04-21 MED ORDER — FLUTICASONE PROPIONATE 50 MCG/ACT NA SUSP: 2.0000 | Freq: Every day | NASAL | 0 refills | Status: DC

## 2017-04-21 NOTE — Progress Notes (Signed)
Subjective:  Cheryl Carroll is a 51 y.o. female who presents for possible sinus infection.  She notes several day hx/o head pressure, sinus pressure, has been taking zyrtec D and no relief.   No sore throat, no fever.   Having some ear pain.    No SOB.  Has some cough.  Has sick contacts.    Nonsmoker.  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  No other aggravating or relieving factors.  No other c/o.  Past Medical History:  Diagnosis Date  . Diabetes mellitus   . Hyperlipidemia   . Hypertension   . Sarcoidosis of lung (HCC)     ROS as in subjective   Objective: BP 132/68   Pulse 93   Temp 98 F (36.7 C)   Wt 159 lb 3.2 oz (72.2 kg)   SpO2 99%   BMI 29.12 kg/m   General appearance: Alert, WD/WN, no distress                             Skin: warm, no rash                           Head: +maxillary sinus tenderness,                            Eyes: conjunctiva normal, corneas clear, PERRLA                            Ears: pearly TMs, external ear canals normal                          Nose: septum midline, turbinates swollen, with erythema and mucoid discharge             Mouth/throat: MMM, tongue normal, mild pharyngeal erythema                           Neck: supple, no adenopathy, no thyromegaly, non tender                          Heart: RRR, normal S1, S2, no murmurs                         Lungs: CTA bilaterally, no wheezes, rales, or rhonchi       Assessment  Encounter Diagnoses  Name Primary?  . Acute non-recurrent maxillary sinusitis Yes  . Cough       Plan: Begin medications below  Specific home care recommendations today include:  Only take over-the-counter (OTC) or prescription medicines for pain, discomfort, or fever as directed by your caregiver.    Decongestant: You may use OTC Guaifenesin (Mucinex plain) for congestion.  You may use Pseudoephedrine (Sudafed) only if you don't have blood pressure problems or a diagnosis of  hypertension.  Cough suppression: If you have cough from drainage, you may use over-the-counter Dextromethorphan (Delsym) as directed on the label  Pain/fever relief: You may use over-the-counter Tylenol for pain or fever  Drink extra fluids. Fluids help thin the mucus so your sinuses can drain more easily.   Applying either moist heat or ice packs to the sinus areas may help  relieve discomfort.  Use saline nasal sprays to help moisten your sinuses. The sprays can be found at your local drugstore.    [name] was seen today for sinus problem.  Diagnoses and all orders for this visit:  Acute non-recurrent maxillary sinusitis  Cough  Other orders -     amoxicillin (AMOXIL) 500 MG tablet; 2 tablets po BID x 10 days -     fluticasone (FLONASE) 50 MCG/ACT nasal spray; Place 2 sprays into both nostrils daily.  Patient was advised to call or return if worse or not improving in the next few days.    Patient voiced understanding of diagnosis, recommendations, and treatment plan.  After visit summary given.

## 2017-04-21 NOTE — Patient Instructions (Signed)
Recommendations  Rest  Hydrate well with water  Use nasal saline flush  Begin Flonase spray 1-2 sprays twice daily  Begin Amoxicillin antibiotic  You can use OTC Mucinex DM  Avoid D products such as Zyrtec D as it has sudafed which raises blood pressure    Using Saline Nose Drops with Bulb Syringe A bulb syringe is used to clear your nose. You may use it when you have a stuffy nose, nasal congestion, sinus pressure, or sneezing.   SALINE SOLUTION You can buy nose drops at your local drug store. You can also make nose drops yourself. Mix 1 cup of water with  teaspoon of salt. Stir. Store this mixture at room temperature. Make a new batch daily.  USE THE BULB IN COMBINATION WITH SALINE NOSE DROPS  Squeeze the air out of the bulb before suctioning the saline mixture.  While still squeezing the bulb flat, place the tip of the bulb into the saline mixture.  Let air come back into the bulb.  This will suction up the saline mixture.  Gently flush one nostril at a time.  Salt water nose drops will then moisten your  congested nose and loosen secretions before suctioning.  Use the bulb syringe as directed below to suction.  USING THE BULB SYRINGE TO SUCTION  While still squeezing the bulb flat, place the tip of the bulb into a nostril. Let air come back into the bulb. The suction will pull snot out of the nose and into the bulb.  Repeat on the other nostril.  Squeeze syringe several times into a tissue.  CLEANING THE BULB SYRINGE  Clean the bulb syringe every day with hot soapy water.  Clean the inside of the bulb by squeezing the bulb while the tip is in soapy water.  Rinse by squeezing the bulb while the tip is in clean hot water.  Store the bulb with the tip side down on paper towel.  HOME CARE INSTRUCTIONS   Use saline nose drops often to keep the nose open and not stuffy.  Throw away used salt water. Make a new solution every time.  Do not use the same  solution and dropper for another person  If you do not prefer to use nasal saline flush, other options include nasal saline spray or the EchoStareti Pot, both of which are available over the counter at your pharmacy.

## 2017-05-15 DIAGNOSIS — E119 Type 2 diabetes mellitus without complications: Secondary | ICD-10-CM | POA: Diagnosis not present

## 2017-05-15 LAB — HM DIABETES EYE EXAM

## 2017-05-18 ENCOUNTER — Encounter: Payer: Self-pay | Admitting: Family Medicine

## 2017-06-14 ENCOUNTER — Ambulatory Visit: Payer: BLUE CROSS/BLUE SHIELD | Admitting: Family Medicine

## 2017-06-14 ENCOUNTER — Encounter: Payer: Self-pay | Admitting: Family Medicine

## 2017-06-14 VITALS — BP 122/84 | HR 101 | Temp 98.1°F | Ht 62.0 in | Wt 157.8 lb

## 2017-06-14 DIAGNOSIS — J209 Acute bronchitis, unspecified: Secondary | ICD-10-CM | POA: Diagnosis not present

## 2017-06-14 MED ORDER — AMOXICILLIN-POT CLAVULANATE 875-125 MG PO TABS: 1.0000 | ORAL_TABLET | Freq: Two times a day (BID) | ORAL | 0 refills | Status: DC

## 2017-06-14 NOTE — Patient Instructions (Signed)
Take all the antibiotic and if not totally back to normal when you finish call me °

## 2017-06-14 NOTE — Progress Notes (Signed)
   Subjective:    Patient ID: Cheryl Carroll, female    DOB: Feb 10, 1966, 51 y.o.   MRN: [id number]  HPI She is here for evaluation of a several month history of productive cough, chest congestion, ear congestion, slight dizziness with PND.  She was treated in October but got only 50% better.  She did not return here for follow-up until now.  No sore throat, shortness of breath, chest pain, malaise or myalgias.  She does not smoke and has no history of allergies. Review of the record also indicates she has not had a mammogram or colonoscopy and she states she is not interested in pursuing either of them at this time.   Review of Systems     Objective:   Physical Exam Alert and in no distress. Tympanic membranes and canals are normal. Pharyngeal area is normal. Neck is supple without adenopathy or thyromegaly. Cardiac exam shows a regular sinus rhythm without murmurs or gallops. Lungs are clear to auscultation.        Assessment & Plan:  Acute bronchitis, unspecified organism - Plan: amoxicillin-clavulanate (AUGMENTIN) 875-125 MG tablet  I did strongly encouraged her to get a mammogram and also let her know that we can set up the colonoscopy whenever she would like.

## 2017-09-15 ENCOUNTER — Telehealth: Payer: Self-pay | Admitting: Family Medicine

## 2017-09-15 MED ORDER — OLMESARTAN MEDOXOMIL-HCTZ 40-12.5 MG PO TABS: 1.0000 | ORAL_TABLET | Freq: Every day | ORAL | 3 refills | Status: DC

## 2017-09-15 NOTE — Telephone Encounter (Signed)
Called pt and lvm to have her to schedule a med check appt with in a month of taking new b/p med. Thanks [employer]

## 2017-09-15 NOTE — Telephone Encounter (Signed)
Have her schedule an appointment after she has been on the medicine for a month

## 2017-09-15 NOTE — Telephone Encounter (Signed)
Pt says Losartan has been recalled and needs different medication called in, also was going to schedule her diabetes recheck but wanted to know if you wanted her to schedule now or after she has been on new BP medicine for a month?

## 2017-10-31 ENCOUNTER — Encounter: Payer: Self-pay | Admitting: Family Medicine

## 2017-11-07 ENCOUNTER — Encounter: Payer: Self-pay | Admitting: Family Medicine

## 2017-11-28 ENCOUNTER — Ambulatory Visit (INDEPENDENT_AMBULATORY_CARE_PROVIDER_SITE_OTHER): Payer: BLUE CROSS/BLUE SHIELD | Admitting: Family Medicine

## 2017-11-28 ENCOUNTER — Encounter: Payer: Self-pay | Admitting: Family Medicine

## 2017-11-28 VITALS — BP 132/78 | HR 97 | Temp 98.0°F | Ht 62.0 in | Wt 159.6 lb

## 2017-11-28 DIAGNOSIS — E1169 Type 2 diabetes mellitus with other specified complication: Secondary | ICD-10-CM | POA: Diagnosis not present

## 2017-11-28 DIAGNOSIS — I10 Essential (primary) hypertension: Secondary | ICD-10-CM | POA: Diagnosis not present

## 2017-11-28 DIAGNOSIS — E1159 Type 2 diabetes mellitus with other circulatory complications: Secondary | ICD-10-CM | POA: Diagnosis not present

## 2017-11-28 DIAGNOSIS — E119 Type 2 diabetes mellitus without complications: Secondary | ICD-10-CM | POA: Diagnosis not present

## 2017-11-28 DIAGNOSIS — E785 Hyperlipidemia, unspecified: Secondary | ICD-10-CM | POA: Diagnosis not present

## 2017-11-28 DIAGNOSIS — I152 Hypertension secondary to endocrine disorders: Secondary | ICD-10-CM

## 2017-11-28 NOTE — Progress Notes (Signed)
   Subjective:    Patient ID: Cheryl Carroll, female    DOB: 11-29-1965, 52 y.o.   MRN: [id number]  HPI She is here for recheck on her blood pressure and also diabetes follow-up.  She was recently switched to a new medication due to insurance issues.  She has had no difficulty with that.  She continues on her metformin as well as Crestor and having no difficulty with any of them.  She is exercising 15 minutes daily.  She has had some difficulty with her glucometer but has been having it checked at work and states it is in the 150 range.  She checks her feet regularly and has an eye exam for later this year.  She does not smoke.  Social history and medications as well as immunizations was reviewed.   Review of Systems     Objective:   Physical Exam Alert and in no distress.  Hemoglobin A1c is 7.4       Assessment & Plan:  Hyperlipidemia associated with type 2 diabetes mellitus (HCC)  Hypertension associated with diabetes (HCC)  Type 2 diabetes mellitus without complication, without long-term current use of insulin (HCC) Encouraged her to increase her physical activity level to 20 minutes every day and also make sure that her glucometer is working properly.

## 2018-02-02 ENCOUNTER — Emergency Department (HOSPITAL_COMMUNITY)
Admission: EM | Admit: 2018-02-02 | Discharge: 2018-02-03 | Disposition: A | Discharge status: Home / Self Care | Payer: BLUE CROSS/BLUE SHIELD | Attending: Emergency Medicine | Admitting: Emergency Medicine

## 2018-02-02 ENCOUNTER — Other Ambulatory Visit: Payer: Self-pay

## 2018-02-02 ENCOUNTER — Emergency Department (HOSPITAL_COMMUNITY): Payer: BLUE CROSS/BLUE SHIELD

## 2018-02-02 DIAGNOSIS — R05 Cough: Secondary | ICD-10-CM | POA: Diagnosis not present

## 2018-02-02 DIAGNOSIS — Z79899 Other long term (current) drug therapy: Secondary | ICD-10-CM | POA: Diagnosis not present

## 2018-02-02 DIAGNOSIS — I10 Essential (primary) hypertension: Secondary | ICD-10-CM | POA: Insufficient documentation

## 2018-02-02 DIAGNOSIS — R059 Cough, unspecified: Secondary | ICD-10-CM

## 2018-02-02 DIAGNOSIS — E119 Type 2 diabetes mellitus without complications: Secondary | ICD-10-CM | POA: Insufficient documentation

## 2018-02-02 DIAGNOSIS — E785 Hyperlipidemia, unspecified: Secondary | ICD-10-CM | POA: Diagnosis not present

## 2018-02-02 MED ORDER — ALBUTEROL SULFATE (2.5 MG/3ML) 0.083% IN NEBU
5.0000 mg | INHALATION_SOLUTION | Freq: Once | RESPIRATORY_TRACT | Status: AC
  Administered 2018-02-02: 5 mg via RESPIRATORY_TRACT
  Filled 2018-02-02: qty 6

## 2018-02-02 NOTE — ED Triage Notes (Signed)
Pt is complaining of upper respiratory and coughing. Pt has hx of bronchitis and sinusitis. Pt has NO SOB, states she feels like she is gagging and something is sitting on her lungs.

## 2018-02-03 MED ORDER — ALBUTEROL SULFATE HFA 108 (90 BASE) MCG/ACT IN AERS
1.0000 | INHALATION_SPRAY | Freq: Four times a day (QID) | RESPIRATORY_TRACT | 0 refills | Status: DC | PRN
Start: 2018-02-03 — End: 2019-04-22

## 2018-02-03 NOTE — ED Notes (Signed)
Provider at bedside

## 2018-02-03 NOTE — ED Provider Notes (Signed)
[facility] EMERGENCY DEPARTMENT Provider Note   CSN: [id number] Arrival date & time: 02/02/18  2150     History   Chief Complaint Chief Complaint  Patient presents with  . Cough    HPI Cheryl Carroll is a 52 y.o. female.  The history is provided by the patient.  Cough  This is a new problem. The current episode started more than 2 days ago. The problem has been gradually worsening. The cough is non-productive. There has been no fever. Pertinent negatives include no chest pain and no shortness of breath.   Patient with a reported history of hypertension, hyperlipidemia and sarcoidosis.  She presents with cough for the past several days.  She reports history of bronchitis.  She denies any chest pain or shortness of breath.  No fever, no hemoptysis.  She does feel at times that she is "gagging" on her saliva.  She is a non-smoker Past Medical History:  Diagnosis Date  . Diabetes mellitus   . Hyperlipidemia   . Hypertension   . Sarcoidosis of lung [facility])     Patient Active Problem List   Diagnosis Date Noted  . Allergic rhinitis due to pollen 12/03/2015  . ACE-inhibitor cough 12/03/2015  . Diabetes mellitus (HCC) 01/27/2011  . Hyperlipidemia associated with type 2 diabetes mellitus (HCC) 12/01/2006  . H/O sarcoidosis 08/31/2006  . Hypertension associated with diabetes (HCC) 08/31/2006    Past Surgical History:  Procedure Laterality Date  . TUBAL LIGATION       OB History   None      Home Medications    Prior to Admission medications   Medication Sig Start Date End Date Taking? Authorizing Provider  albuterol (PROVENTIL HFA;VENTOLIN HFA) 108 (90 Base) MCG/ACT inhaler Inhale 1-2 puffs into the lungs every 6 (six) hours as needed for wheezing or shortness of breath. 02/03/18   Zadie Rhine, MD  amoxicillin-clavulanate (AUGMENTIN) 875-125 MG tablet Take 1 tablet by mouth 2 (two) times daily. 06/14/17   Ronnald Nian, MD  fluticasone (FLONASE)  50 MCG/ACT nasal spray Place 2 sprays into both nostrils daily. 04/21/17   Tysinger, Kermit Balo, PA-C  glucose blood (ONE TOUCH TEST STRIPS) test strip Patient is to test one time a day DX E11.9 08/08/16   Ronnald Nian, MD  metFORMIN (GLUCOPHAGE) 500 MG tablet Take 1 tablet (500 mg total) by mouth 2 (two) times daily with a meal. 04/04/17   Ronnald Nian, MD  naproxen (NAPROSYN) 250 MG tablet Take 1 tablet (250 mg total) by mouth 2 (two) times daily with a meal. Patient not taking: Reported on 06/14/2017 05/12/16   Everlene Farrier, PA-C  olmesartan-hydrochlorothiazide (BENICAR HCT) 40-12.5 MG tablet Take 1 tablet by mouth daily. 09/15/17   Ronnald Nian, MD  [facility] DELICA LANCETS FINE MISC Patient is to test one time a day DX E11.9 08/08/16   Ronnald Nian, MD  rosuvastatin (CRESTOR) 20 MG tablet Take 1 tablet (20 mg total) by mouth daily. 04/04/17   Ronnald Nian, MD    Family History Family History  Problem Relation Age of Onset  . Diabetes Mother   . Hyperlipidemia Mother   . Hypertension Mother   . Cancer Mother 5       Breast cancer  . Hyperlipidemia Father   . Hypertension Father   . Diabetes Father     Social History Social History   Tobacco Use  . Smoking status: Never Smoker  . Smokeless tobacco: Never  Used  Substance Use Topics  . Alcohol use: No  . Drug use: No     Allergies   Patient has no known allergies.   Review of Systems Review of Systems  Constitutional: Negative for fever.  Respiratory: Positive for cough. Negative for shortness of breath.   Cardiovascular: Negative for chest pain.  Gastrointestinal: Negative for vomiting.  All other systems reviewed and are negative.    Physical Exam Updated Vital Signs BP (!) 143/82 (BP Location: Right Arm)   Pulse 94   Temp 97.8 F (36.6 C) (Oral)   Resp 18   Ht 1.575 m (5\' 2" )   Wt 72.6 kg (160 lb)   SpO2 96%   BMI 29.26 kg/m   Physical Exam CONSTITUTIONAL: Well developed/well nourished HEAD:  Normocephalic/atraumatic EYES: EOMI ENMT: Mucous membranes moist, no stridor NECK: supple no meningeal signs SPINE/BACK:entire spine nontender CV: S1/S2 noted, no murmurs/rubs/gallops noted LUNGS: Lungs are clear to auscultation bilaterally, no apparent distress ABDOMEN: soft, nontender, no rebound or guarding, bowel sounds noted throughout abdomen GU:no cva tenderness NEURO: Pt is awake/alert/appropriate, moves all extremitiesx4.   EXTREMITIES: pulses normal/equal, full ROM, no lower extremity edema SKIN: warm, color normal PSYCH: no abnormalities of mood noted, alert and oriented to situation   ED Treatments / Results  Labs (all labs ordered are listed, but only abnormal results are displayed) Labs Reviewed - No data to display  EKG None  Radiology Dg Chest 2 View  Result Date: 02/02/2018 CLINICAL DATA:  Upper respiratory complaints with cough. EXAM: CHEST - 2 VIEW COMPARISON:  12/11/2015 FINDINGS: The heart size and mediastinal contours are within normal limits. Both lungs are clear. The visualized skeletal structures are unremarkable. IMPRESSION: No active cardiopulmonary disease. Electronically Signed   By: Kennith CenterEric  Mansell M.D.   On: 02/02/2018 23:05    Procedures Procedures   Medications Ordered in ED Medications  albuterol (PROVENTIL) (2.5 MG/3ML) 0.083% nebulizer solution 5 mg (5 mg Nebulization Given 02/02/18 2243)     Initial Impression / Assessment and Plan / ED Course  I have reviewed the triage vital signs and the nursing notes.  Pertinent imaging results that were available during my care of the patient were reviewed by me and considered in my medical decision making (see chart for details).     She presented for cough for several days.  Her vital signs are appropriate.  Chest x-ray reviewed and is negative.  She is already felt improved after receiving albuterol emergency department.  She rested comfortably in the ER for several hours and felt well.  She is  agreeable with plan to be discharged home.  Suspect very mild illness at this time.  No hypoxia noted.  Final Clinical Impressions(s) / ED Diagnoses   Final diagnoses:  Cough    ED Discharge Orders        Ordered    albuterol (PROVENTIL HFA;VENTOLIN HFA) 108 (90 Base) MCG/ACT inhaler  Every 6 hours PRN     02/03/18 0557       Zadie RhineWickline, Yanira Tolsma, MD 02/03/18 [phone]

## 2018-02-06 ENCOUNTER — Ambulatory Visit: Payer: BLUE CROSS/BLUE SHIELD | Admitting: Family Medicine

## 2018-03-11 ENCOUNTER — Other Ambulatory Visit: Payer: Self-pay

## 2018-03-11 ENCOUNTER — Emergency Department (HOSPITAL_COMMUNITY): Payer: BLUE CROSS/BLUE SHIELD

## 2018-03-11 ENCOUNTER — Emergency Department (HOSPITAL_COMMUNITY)
Admission: EM | Admit: 2018-03-11 | Discharge: 2018-03-12 | Disposition: A | Discharge status: Home / Self Care | Payer: BLUE CROSS/BLUE SHIELD | Attending: Emergency Medicine | Admitting: Emergency Medicine

## 2018-03-11 ENCOUNTER — Encounter (HOSPITAL_COMMUNITY): Payer: Self-pay

## 2018-03-11 DIAGNOSIS — I10 Essential (primary) hypertension: Secondary | ICD-10-CM | POA: Insufficient documentation

## 2018-03-11 DIAGNOSIS — Z7984 Long term (current) use of oral hypoglycemic drugs: Secondary | ICD-10-CM | POA: Insufficient documentation

## 2018-03-11 DIAGNOSIS — Z79899 Other long term (current) drug therapy: Secondary | ICD-10-CM | POA: Diagnosis not present

## 2018-03-11 DIAGNOSIS — R05 Cough: Secondary | ICD-10-CM | POA: Diagnosis not present

## 2018-03-11 DIAGNOSIS — J40 Bronchitis, not specified as acute or chronic: Secondary | ICD-10-CM | POA: Diagnosis not present

## 2018-03-11 DIAGNOSIS — E119 Type 2 diabetes mellitus without complications: Secondary | ICD-10-CM | POA: Diagnosis not present

## 2018-03-11 DIAGNOSIS — R0789 Other chest pain: Secondary | ICD-10-CM | POA: Diagnosis not present

## 2018-03-11 LAB — I-STAT BETA HCG BLOOD, ED (MC, WL, AP ONLY)

## 2018-03-11 LAB — I-STAT TROPONIN, ED: TROPONIN I, POC: 0.02 ng/mL (ref 0.00–0.08)

## 2018-03-11 LAB — CBC
HCT: 40.3 % (ref 36.0–46.0)
Hemoglobin: 12.9 g/dL (ref 12.0–15.0)
MCH: 26.9 pg (ref 26.0–34.0)
MCHC: 32 g/dL (ref 30.0–36.0)
MCV: 84 fL (ref 78.0–100.0)
PLATELETS: 229 10*3/uL (ref 150–400)
RBC: 4.8 MIL/uL (ref 3.87–5.11)
RDW: 14.1 % (ref 11.5–15.5)
WBC: 5.6 10*3/uL (ref 4.0–10.5)

## 2018-03-11 LAB — BASIC METABOLIC PANEL
Anion gap: 11 (ref 5–15)
BUN: 8 mg/dL (ref 6–20)
CALCIUM: 9.4 mg/dL (ref 8.9–10.3)
CHLORIDE: 102 mmol/L (ref 98–111)
CO2: 27 mmol/L (ref 22–32)
CREATININE: 0.79 mg/dL (ref 0.44–1.00)
Glucose, Bld: 124 mg/dL — ABNORMAL HIGH (ref 70–99)
Potassium: 3.4 mmol/L — ABNORMAL LOW (ref 3.5–5.1)
SODIUM: 140 mmol/L (ref 135–145)

## 2018-03-11 MED ORDER — ACETAMINOPHEN 325 MG PO TABS
650.0000 mg | ORAL_TABLET | Freq: Once | ORAL | Status: AC
  Administered 2018-03-11: 650 mg via ORAL
  Filled 2018-03-11: qty 2

## 2018-03-11 MED ORDER — SODIUM CHLORIDE 0.9 % IV BOLUS
1000.0000 mL | Freq: Once | INTRAVENOUS | Status: AC
  Administered 2018-03-12: 1000 mL via INTRAVENOUS

## 2018-03-11 NOTE — ED Provider Notes (Signed)
[facility] EMERGENCY DEPARTMENT Provider Note   CSN: [id number] Arrival date & time: 03/11/18  2035     History   Chief Complaint Chief Complaint  Patient presents with  . Cough  . Chest Pain    HPI Cheryl Carroll is a 52 y.o. female is a past medical history of sarcoidosis, hypertension, hyperlipidemia, diabetes who presents to ED for evaluation of 1 week history of cough adducted with yellow mucus and 1 day history of fever of T-max 101, body aches, chest pain.  States that the chest pain is worse with coughing.  She has tried NSAIDs with no improvement in her symptoms.  She denies any pleuritic chest pain, shortness of breath or wheezing.  She denies any sick contacts with similar symptoms.  Denies history of MI, PE, recent surgeries, recent prolonged travel.  She denies any alcohol, tobacco or other drug use, family history of heart disease, history of cancer, abdominal pain, vomiting or changes in appetite.  HPI  Past Medical History:  Diagnosis Date  . Diabetes mellitus   . Hyperlipidemia   . Hypertension   . Sarcoidosis of lung [facility])     Patient Active Problem List   Diagnosis Date Noted  . Allergic rhinitis due to pollen 12/03/2015  . ACE-inhibitor cough 12/03/2015  . Diabetes mellitus (HCC) 01/27/2011  . Hyperlipidemia associated with type 2 diabetes mellitus (HCC) 12/01/2006  . H/O sarcoidosis 08/31/2006  . Hypertension associated with diabetes (HCC) 08/31/2006    Past Surgical History:  Procedure Laterality Date  . TUBAL LIGATION       OB History   None      Home Medications    Prior to Admission medications   Medication Sig Start Date End Date Taking? Authorizing Provider  fluticasone (FLONASE) 50 MCG/ACT nasal spray Place 2 sprays into both nostrils daily. 04/21/17  Yes Tysinger, Kermit Balo, PA-C  metFORMIN (GLUCOPHAGE) 500 MG tablet Take 1 tablet (500 mg total) by mouth 2 (two) times daily with a meal. 04/04/17  Yes Ronnald Nian,  MD  olmesartan-hydrochlorothiazide (BENICAR HCT) 40-12.5 MG tablet Take 1 tablet by mouth daily. 09/15/17  Yes Ronnald Nian, MD  rosuvastatin (CRESTOR) 20 MG tablet Take 1 tablet (20 mg total) by mouth daily. 04/04/17  Yes Ronnald Nian, MD  albuterol (PROVENTIL HFA;VENTOLIN HFA) 108 (90 Base) MCG/ACT inhaler Inhale 1-2 puffs into the lungs every 6 (six) hours as needed for wheezing or shortness of breath. Patient not taking: Reported on 03/11/2018 02/03/18   Zadie Rhine, MD  amoxicillin-clavulanate (AUGMENTIN) 875-125 MG tablet Take 1 tablet by mouth every 12 (twelve) hours. 03/12/18   Tametria Aho, PA-C  glucose blood (ONE TOUCH TEST STRIPS) test strip Patient is to test one time a day DX E11.9 08/08/16   Ronnald Nian, MD  naproxen (NAPROSYN) 250 MG tablet Take 1 tablet (250 mg total) by mouth 2 (two) times daily with a meal. Patient not taking: Reported on 06/14/2017 05/12/16   Everlene Farrier, PA-C  [facility] DELICA LANCETS FINE MISC Patient is to test one time a day DX E11.9 08/08/16   Ronnald Nian, MD    Family History Family History  Problem Relation Age of Onset  . Diabetes Mother   . Hyperlipidemia Mother   . Hypertension Mother   . Cancer Mother 67       Breast cancer  . Hyperlipidemia Father   . Hypertension Father   . Diabetes Father     Social History  Social History   Tobacco Use  . Smoking status: Never Smoker  . Smokeless tobacco: Never Used  Substance Use Topics  . Alcohol use: No  . Drug use: No     Allergies   Patient has no known allergies.   Review of Systems Review of Systems  Constitutional: Positive for chills and fever. Negative for appetite change.  HENT: Positive for rhinorrhea and sinus pain. Negative for ear pain, sneezing and sore throat.   Eyes: Negative for photophobia and visual disturbance.  Respiratory: Positive for cough. Negative for chest tightness, shortness of breath and wheezing.   Cardiovascular: Positive for chest pain.  Negative for palpitations.  Gastrointestinal: Negative for abdominal pain, blood in stool, constipation, diarrhea, nausea and vomiting.  Genitourinary: Negative for dysuria, hematuria and urgency.  Musculoskeletal: Positive for myalgias.  Skin: Negative for rash.  Neurological: Negative for dizziness, weakness and light-headedness.     Physical Exam Updated Vital Signs BP 127/70 (BP Location: Left Arm)   Pulse 97   Temp 99 F (37.2 C) (Oral)   Resp 17   SpO2 96%   Physical Exam  Constitutional: She appears well-developed and well-nourished. No distress.  HENT:  Head: Normocephalic and atraumatic.  Nose: Nose normal.  Eyes: Conjunctivae and EOM are normal. Left eye exhibits no discharge. No scleral icterus.  Neck: Normal range of motion. Neck supple.  Cardiovascular: Regular rhythm, normal heart sounds and intact distal pulses. Tachycardia present. Exam reveals no gallop and no friction rub.  No murmur heard. Pulmonary/Chest: Effort normal. No respiratory distress. She has rhonchi in the left lower field.  Abdominal: Soft. Bowel sounds are normal. She exhibits no distension. There is no tenderness. There is no guarding.  Musculoskeletal: Normal range of motion. She exhibits no edema.  No lower extremity edema, erythema or calf tenderness bilaterally.  Neurological: She is alert. She exhibits normal muscle tone. Coordination normal.  Skin: Skin is warm and dry. No rash noted.  Psychiatric: She has a normal mood and affect.  Nursing note and vitals reviewed.    ED Treatments / Results  Labs (all labs ordered are listed, but only abnormal results are displayed) Labs Reviewed  BASIC METABOLIC PANEL - Abnormal; Notable for the following components:      Result Value   Potassium 3.4 (*)    Glucose, Bld 124 (*)    All other components within normal limits  CBC  D-DIMER, QUANTITATIVE (NOT AT [facility])  I-STAT TROPONIN, ED  I-STAT BETA HCG BLOOD, ED ([facility], [facility], [facility] ONLY)  I-STAT  TROPONIN, ED    EKG EKG Interpretation  Date/Time:  Sunday March 11 2018 21:14:58 EDT Ventricular Rate:  107 PR Interval:  156 QRS Duration: 76 QT Interval:  302 QTC Calculation: 403 R Axis:   158 Text Interpretation:   Suspect arm lead reversal, interpretation assumes no reversal Sinus tachycardia Right axis deviation Abnormal ECG Confirmed by Rochele Raring [phone]) on 03/11/2018 11:17:51 PM   Radiology Dg Chest 2 View  Result Date: 03/11/2018 CLINICAL DATA:  Cough and fever.  Productive cough.  Chest burning. EXAM: CHEST - 2 VIEW COMPARISON:  Radiographs 02/02/2018 FINDINGS: The cardiomediastinal contours are normal. Borderline mild bronchial thickening in the lower lobes. Pulmonary vasculature is normal. No consolidation, pleural effusion, or pneumothorax. No acute osseous abnormalities are seen. IMPRESSION: Borderline mild lower lobe bronchial thickening can be seen with bronchitis. No focal consolidation to suggest pneumonia. Electronically Signed   By: Narda Rutherford M.D.   On: 03/11/2018 21:52  Procedures Procedures (including critical care time)  Medications Ordered in ED Medications  amoxicillin-clavulanate (AUGMENTIN) 875-125 MG per tablet 1 tablet (has no administration in time range)  acetaminophen (TYLENOL) tablet 650 mg (650 mg Oral Given 03/11/18 2340)  sodium chloride 0.9 % bolus 1,000 mL (1,000 mLs Intravenous New Bag/Given 03/12/18 0013)     Initial Impression / Assessment and Plan / ED Course  I have reviewed the triage vital signs and the nursing notes.  Pertinent labs & imaging results that were available during my care of the patient were reviewed by me and considered in my medical decision making (see chart for details).     52 year old female with past medical history of sarcoidosis, hypertension, hyperlipidemia, diabetes presents to ED for evaluation of 1 week history of cough with yellow mucus and 1 day history of fever with T-max 101, body aches and  chest pain.  Chest pain is worse with coughing.  Denies any pleuritic chest pain, shortness of breath or wheezing.  No improvement with 1 dose of NSAIDs.  Denies history of MI, PE, recent surgeries, recent prolonged travel, drug use.  On exam patient initially tachycardic to low 100s.  Febrile to 100.5.  Chest pain is reproducible with palpation.  Mild rhonchi noted with improvement with medications.  No lower extremity edema or calf tenderness to suggest DVT.  Fever improved with antipyretics.  Heart rate improved with fluids.  EKG shows tach sinus tachycardia with no changes from prior.  Chest x-ray shows bronchitis with no signs of pneumonia.  CBC, d-dimer, BMP, hCG and troponin x2 are all unremarkable.  Patient has been treated with antibiotics in the past for bronchitis flareups.  States that this has helped her.  She is ruled out for PE with d-dimer and cardiac cause of symptoms.  Chest pain improved with Tylenol.  Will give Augmentin and advised her to return to ED for any severe worsening symptoms.  Portions of this note were generated with Scientist, clinical (histocompatibility and immunogenetics). Dictation errors may occur despite best attempts at proofreading.  Final Clinical Impressions(s) / ED Diagnoses   Final diagnoses:  Bronchitis    ED Discharge Orders         Ordered    amoxicillin-clavulanate (AUGMENTIN) 875-125 MG tablet  Every 12 hours     03/12/18 0114           Dietrich Pates, PA-C 03/12/18 0119    Eber Hong, MD 03/13/18 [phone]

## 2018-03-11 NOTE — ED Triage Notes (Signed)
Pt states that she has had a cough for the past week, productive yellow sputum, fevers, back pain, bodyaches.

## 2018-03-11 NOTE — ED Triage Notes (Signed)
Pt adds that she is having burning in her chest that radiates to her back since yesterday

## 2018-03-12 LAB — D-DIMER, QUANTITATIVE: D-Dimer, Quant: 0.29 ug{FEU}/mL (ref 0.00–0.50)

## 2018-03-12 LAB — I-STAT TROPONIN, ED: Troponin i, poc: 0.02 ng/mL (ref 0.00–0.08)

## 2018-03-12 MED ORDER — AMOXICILLIN-POT CLAVULANATE 875-125 MG PO TABS: 1.0000 | ORAL_TABLET | Freq: Two times a day (BID) | ORAL | 0 refills | Status: DC

## 2018-03-12 MED ORDER — AMOXICILLIN-POT CLAVULANATE 875-125 MG PO TABS
1.0000 | ORAL_TABLET | Freq: Once | ORAL | Status: AC
  Administered 2018-03-12: 1 via ORAL
  Filled 2018-03-12: qty 1

## 2018-03-12 NOTE — Discharge Instructions (Signed)
Return to ED for worsening symptoms, worsening of your chest pain, coughing up blood, leg swelling or trouble breathing.

## 2018-03-20 ENCOUNTER — Encounter: Payer: Self-pay | Admitting: Family Medicine

## 2018-03-20 ENCOUNTER — Ambulatory Visit: Payer: BLUE CROSS/BLUE SHIELD | Admitting: Family Medicine

## 2018-03-20 VITALS — BP 110/74 | HR 92 | Temp 98.7°F | Wt 161.0 lb

## 2018-03-20 DIAGNOSIS — J209 Acute bronchitis, unspecified: Secondary | ICD-10-CM

## 2018-03-20 DIAGNOSIS — Z23 Encounter for immunization: Secondary | ICD-10-CM | POA: Diagnosis not present

## 2018-03-20 DIAGNOSIS — E785 Hyperlipidemia, unspecified: Secondary | ICD-10-CM

## 2018-03-20 DIAGNOSIS — E1169 Type 2 diabetes mellitus with other specified complication: Secondary | ICD-10-CM

## 2018-03-20 DIAGNOSIS — I152 Hypertension secondary to endocrine disorders: Secondary | ICD-10-CM

## 2018-03-20 DIAGNOSIS — E1159 Type 2 diabetes mellitus with other circulatory complications: Secondary | ICD-10-CM | POA: Diagnosis not present

## 2018-03-20 DIAGNOSIS — I10 Essential (primary) hypertension: Secondary | ICD-10-CM

## 2018-03-20 DIAGNOSIS — E119 Type 2 diabetes mellitus without complications: Secondary | ICD-10-CM | POA: Diagnosis not present

## 2018-03-20 MED ORDER — AMOXICILLIN-POT CLAVULANATE 875-125 MG PO TABS: 1.0000 | ORAL_TABLET | Freq: Two times a day (BID) | ORAL | 0 refills | Status: DC

## 2018-03-20 MED ORDER — FLUCONAZOLE 150 MG PO TABS: 150.0000 mg | ORAL_TABLET | Freq: Once | ORAL | 0 refills | Status: AC

## 2018-03-20 NOTE — Progress Notes (Signed)
  Subjective:    Patient ID: Cheryl Carroll, female    DOB: 1966/05/17, 52 y.o.   MRN: [id number]  Cheryl Carroll is a 52 y.o. female who presents for follow-up of Type 2 diabetes mellitus.  She was seen in the ED on September 8 and treated for bronchitis.  She was given a one-week supply of medication and states that she is roughly 80% better.  Patient is checking home blood sugars.  Running in the 150 range. Home blood sugar records: meter record How often is blood sugars being checked: QD Current symptoms/problems include none and have been unchanged. Daily foot checks: yes   Any foot concerns: yes pain Last eye exam: last year Exercise: walking Changes on her metformin as well as her blood pressure medication and Crestor and is having no difficulty with that.  Her allergies seem to be under good control.  She rarely uses albuterol. The following portions of the patient's history were reviewed and updated as appropriate: allergies, current medications, past medical history, past social history and problem list.  ROS as in subjective above.     Objective:    Physical Exam Alert and in no distress. Tympanic membranes and canals are normal. Pharyngeal area is normal. Neck is supple without adenopathy or thyromegaly. Cardiac exam shows a regular sinus rhythm without murmurs or gallops. Lungs are clear to auscultation. A1c is 7.7 Blood pressure 110/74, pulse 92, temperature 98.7 F (37.1 C), weight 161 lb (73 kg), SpO2 97 %.  Lab Review Diabetic Labs Latest Ref Rng & Units 03/11/2018 04/04/2017 12/12/2016 08/08/2016 04/04/2016  HbA1c - - 7.5% 7.7 7.1 7.3  Microalbumin mg/L - 23.8 - - 7.1  Micro/Creat Ratio - - 5.0 - - 10.5  Chol <200 mg/dL - 782135 - 956(O214(H) 130(Q235(H)  HDL >50 mg/dL - 56 - 65(H49(L) 47  Calc LDL mg/dL (calc) - 60 - 846(N138(H) 629(B161(H)  Triglycerides <150 mg/dL - 284103 - 132135 440133  Creatinine 0.44 - 1.00 mg/dL 1.020.79 7.250.75 - - 3.660.71   BP/Weight 03/20/2018 03/12/2018 02/03/2018 02/02/2018 11/28/2017    Systolic BP 110 129 143 - 132  Diastolic BP 74 75 82 - 78  Wt. (Lbs) 161 - - 160 159.6  BMI 29.45 - - 29.26 29.19   Foot/eye exam completion dates Latest Ref Rng & Units 05/15/2017 04/04/2017  Eye Exam No Retinopathy No Retinopathy -  Foot Form Completion - - Done    Cheryl Carroll  reports that she has never smoked. She has never used smokeless tobacco. She reports that she does not drink alcohol or use drugs.     Assessment & Plan:    Type 2 diabetes mellitus without complication, without long-term current use of insulin (HCC)  Need for influenza vaccination - Plan: Flu Vaccine QUAD 6+ mos PF IM (Fluarix Quad PF)  Hyperlipidemia associated with type 2 diabetes mellitus (HCC)  Hypertension associated with diabetes (HCC)  Acute bronchitis, unspecified organism - Plan: amoxicillin-clavulanate (AUGMENTIN) 875-125 MG tablet, fluconazole (DIFLUCAN) 150 MG tablet    1. Rx changes: Augmentin was renewed and also given Diflucan. 2. Education: Reviewed 'ABCs' of diabetes management (respective goals in parentheses):  A1C (<7), blood pressure (<130/80), and cholesterol (LDL <100). 3. Compliance at present is estimated to be fair. Efforts to improve compliance (if necessary) will be directed at increased exercise. 4. Follow up: 4 months  Encouraged her to become more physically active.  She will call if not entirely better when she finishes the antibiotic.

## 2018-05-01 ENCOUNTER — Other Ambulatory Visit: Payer: Self-pay | Admitting: Family Medicine

## 2018-05-01 DIAGNOSIS — Z1231 Encounter for screening mammogram for malignant neoplasm of breast: Secondary | ICD-10-CM

## 2018-05-12 ENCOUNTER — Other Ambulatory Visit: Payer: Self-pay | Admitting: Family Medicine

## 2018-05-12 DIAGNOSIS — E785 Hyperlipidemia, unspecified: Principal | ICD-10-CM

## 2018-05-12 DIAGNOSIS — E1169 Type 2 diabetes mellitus with other specified complication: Secondary | ICD-10-CM

## 2018-05-14 ENCOUNTER — Ambulatory Visit: Payer: BLUE CROSS/BLUE SHIELD

## 2018-05-28 DIAGNOSIS — E119 Type 2 diabetes mellitus without complications: Secondary | ICD-10-CM | POA: Diagnosis not present

## 2018-05-28 LAB — HM DIABETES EYE EXAM

## 2018-06-18 ENCOUNTER — Encounter: Payer: Self-pay | Admitting: Family Medicine

## 2018-06-18 ENCOUNTER — Ambulatory Visit (INDEPENDENT_AMBULATORY_CARE_PROVIDER_SITE_OTHER): Payer: BLUE CROSS/BLUE SHIELD | Admitting: Family Medicine

## 2018-06-18 VITALS — BP 134/80 | HR 97 | Temp 98.2°F | Wt 162.4 lb

## 2018-06-18 DIAGNOSIS — J069 Acute upper respiratory infection, unspecified: Secondary | ICD-10-CM

## 2018-06-18 DIAGNOSIS — B9789 Other viral agents as the cause of diseases classified elsewhere: Secondary | ICD-10-CM | POA: Diagnosis not present

## 2018-06-18 NOTE — Patient Instructions (Signed)
?   Take up to 4 Advil 3 times per day for the headaches and aches and pains  Viral Respiratory Infection A viral respiratory infection is an illness that affects parts of the body used for breathing, like the lungs, nose, and throat. It is caused by a germ called a virus. Some examples of this kind of infection are:  A cold.  The flu (influenza).  A respiratory syncytial virus (RSV) infection.  How do I know if I have this infection? Most of the time this infection causes:  A stuffy or runny nose.  Yellow or green fluid in the nose.  A cough.  Sneezing.  Tiredness (fatigue).  Achy muscles.  A sore throat.  Sweating or chills.  A fever.  A headache.  How is this infection treated? If the flu is diagnosed early, it may be treated with an antiviral medicine. This medicine shortens the length of time a person has symptoms. Symptoms may be treated with over-the-counter and prescription medicines, such as:  Expectorants. These make it easier to cough up mucus.  Decongestant nasal sprays.  Doctors do not prescribe antibiotic medicines for viral infections. They do not work with this kind of infection. How do I know if I should stay home? To keep others from getting sick, stay home if you have:  A fever.  A lasting cough.  A sore throat.  A runny nose.  Sneezing.  Muscles aches.  Headaches.  Tiredness.  Weakness.  Chills.  Sweating.  An upset stomach (nausea).  Follow these instructions at home:  Rest as much as possible.  Take over-the-counter and prescription medicines only as told by your doctor.  Drink enough fluid to keep your pee (urine) clear or pale yellow.  Gargle with salt water. Do this 3-4 times per day or as needed. To make a salt-water mixture, dissolve -1 tsp of salt in 1 cup of warm water. Make sure the salt dissolves all the way.  Use nose drops made from salt water. This helps with stuffiness (congestion). It also helps soften  the skin around your nose.  Do not drink alcohol.  Do not use tobacco products, including cigarettes, chewing tobacco, and e-cigarettes. If you need help quitting, ask your doctor. Get help if:  Your symptoms last for 10 days or longer.  Your symptoms get worse over time.  You have a fever.  You have very bad pain in your face or forehead.  Parts of your jaw or neck become very swollen. Get help right away if:  You feel pain or pressure in your chest.  You have shortness of breath.  You faint or feel like you will faint.  You keep throwing up (vomiting).  You feel confused. This information is not intended to replace advice given to you by your health care provider. Make sure you discuss any questions you have with your health care provider. Document Released: 06/02/2008 Document Revised: 11/26/2015 Document Reviewed: 11/26/2014 Elsevier Interactive Patient Education  2018 [employer]

## 2018-06-18 NOTE — Progress Notes (Signed)
   Subjective:    Patient ID: Cheryl PlumeAngela M Ector, female    DOB: 29-Oct-1965, 52 y.o.   MRN: [id number]  HPI She complains of a 3-day history of frontal headache, nasal congestion, sinus pressure and slightly dry cough.  No sore throat, earache, fever or chills. She has been taking 2 Advil twice per day and getting some relief of the symptoms. Review of Systems     Objective:   Physical Exam Alert and in no distress. Tympanic membranes and canals are normal. Pharyngeal area is normal. Neck is supple without adenopathy or thyromegaly. Cardiac exam shows a regular sinus rhythm without murmurs or gallops. Lungs are clear to auscultation.        Assessment & Plan:  Viral URI with cough I explained that this is a viral illness and will likely go away after 7 days.  Explained that she might have a more productive cough but this is not of any major concern.  If still having difficulty in a week, she is to call

## 2018-06-25 ENCOUNTER — Ambulatory Visit: Payer: BLUE CROSS/BLUE SHIELD

## 2018-07-13 ENCOUNTER — Other Ambulatory Visit: Payer: Self-pay | Admitting: Family Medicine

## 2018-07-13 DIAGNOSIS — E119 Type 2 diabetes mellitus without complications: Secondary | ICD-10-CM

## 2018-07-23 ENCOUNTER — Ambulatory Visit: Payer: BLUE CROSS/BLUE SHIELD | Admitting: Family Medicine

## 2018-07-24 ENCOUNTER — Ambulatory Visit
Admission: RE | Admit: 2018-07-24 | Discharge: 2018-07-24 | Disposition: A | Discharge status: Home / Self Care | Payer: BLUE CROSS/BLUE SHIELD | Source: Ambulatory Visit | Attending: Family Medicine | Admitting: Family Medicine

## 2018-07-24 ENCOUNTER — Encounter: Payer: Self-pay | Admitting: Family Medicine

## 2018-07-24 ENCOUNTER — Ambulatory Visit: Payer: BLUE CROSS/BLUE SHIELD | Admitting: Family Medicine

## 2018-07-24 VITALS — BP 128/80 | HR 98 | Temp 98.1°F | Wt 165.0 lb

## 2018-07-24 DIAGNOSIS — M545 Low back pain, unspecified: Secondary | ICD-10-CM

## 2018-07-24 DIAGNOSIS — E1169 Type 2 diabetes mellitus with other specified complication: Secondary | ICD-10-CM | POA: Diagnosis not present

## 2018-07-24 DIAGNOSIS — I152 Hypertension secondary to endocrine disorders: Secondary | ICD-10-CM

## 2018-07-24 DIAGNOSIS — E119 Type 2 diabetes mellitus without complications: Secondary | ICD-10-CM

## 2018-07-24 DIAGNOSIS — E1159 Type 2 diabetes mellitus with other circulatory complications: Secondary | ICD-10-CM | POA: Diagnosis not present

## 2018-07-24 DIAGNOSIS — M5441 Lumbago with sciatica, right side: Secondary | ICD-10-CM | POA: Diagnosis not present

## 2018-07-24 DIAGNOSIS — E785 Hyperlipidemia, unspecified: Secondary | ICD-10-CM

## 2018-07-24 DIAGNOSIS — I10 Essential (primary) hypertension: Secondary | ICD-10-CM | POA: Diagnosis not present

## 2018-07-24 DIAGNOSIS — N951 Menopausal and female climacteric states: Secondary | ICD-10-CM

## 2018-07-24 LAB — POCT GLYCOSYLATED HEMOGLOBIN (HGB A1C): HEMOGLOBIN A1C: 7.7 % — AB (ref 4.0–5.6)

## 2018-07-24 LAB — POCT UA - MICROALBUMIN
ALBUMIN/CREATININE RATIO, URINE, POC: 5.2
Creatinine, POC: 196.7 mg/dL
Microalbumin Ur, POC: 10.2 mg/L

## 2018-07-24 NOTE — Patient Instructions (Signed)
Take 800 mg of the ibuprofen 3 times per day.  Heat to your back for about 20 minutes 3 times per day with gentle stretching afterwards.  Proper posturing is very call me in 2 weeks to let me know how you are doing

## 2018-07-24 NOTE — Progress Notes (Signed)
Subjective:    Patient ID: Cheryl Carroll, female    DOB: 10/18/1965, 53 y.o.   MRN: [id number]  Cheryl Carroll is a 53 y.o. female who presents for follow-up of Type 2 diabetes mellitus.  Patient is checking home blood sugars.   Home blood sugar records: meter record How often is blood sugars being checked: qd fasting or 2 hr. Post meal 105-180 Current symptoms/problems include none at this time. Daily foot checks: yes   Any foot concerns:none Last eye exam:05/2018 Exercise: legs and walking on weekends.  She complains of a 36-month history of right-sided low back pain with radiation down her legs.  Standing makes it better and sitting makes it worse.  She has a remote history of HNP in 2009 but did not have surgery.  She also states that she has had some cervical disc disease as well.  No numbness, tingling or weakness.  She does intermittently use Advil for this.  She also has noted that her menstrual cycles are becoming more erratic. She continues on metformin, Crestor, olmesartan/HCTZ.  The following portions of the patient's history were reviewed and updated as appropriate: allergies, current medications, past medical history, past social history and problem list.  ROS as in subjective above.     Objective:    Physical Exam Alert and in no distress.  Normal lumbar curve and normal motion.  No tenderness over SI joints.  Hip motion is normal.  DTRs normal.  Questionably positive straight leg raising on the right at approximately 75 degrees.  Normal motor.  Foot exam is done and is recorded as normal.   Lab Review Diabetic Labs Latest Ref Rng & Units 03/11/2018 04/04/2017 12/12/2016 08/08/2016 04/04/2016  HbA1c - - 7.5% 7.7 7.1 7.3  Microalbumin mg/L - 23.8 - - 7.1  Micro/Creat Ratio - - 5.0 - - 10.5  Chol <200 mg/dL - 016 - 010(X) 323(F)  HDL >50 mg/dL - 56 - 57(D) 47  Calc LDL mg/dL (calc) - 60 - 220(U) 542(H)  Triglycerides <150 mg/dL - 062 - 376 283  Creatinine 0.44 - 1.00 mg/dL  1.51 7.61 - - 6.07   BP/Weight 06/18/2018 03/20/2018 03/12/2018 02/03/2018 02/02/2018  Systolic BP 134 110 129 143 -  Diastolic BP 80 74 75 82 -  Wt. (Lbs) 162.4 161 - - 160  BMI 29.7 29.45 - - 29.26   Foot/eye exam completion dates Latest Ref Rng & Units 05/28/2018 05/15/2017  Eye Exam No Retinopathy No Retinopathy No Retinopathy  Foot Form Completion - - -  A1c is 7.7 Errin  reports that she has never smoked. She has never used smokeless tobacco. She reports that she does not drink alcohol or use drugs.     Assessment & Plan:    Acute right-sided low back pain without sciatica - Plan: DG Lumbar Spine Complete  Type 2 diabetes mellitus without complication, without long-term current use of insulin (HCC) - Plan: CBC with Differential/Platelet, Comprehensive metabolic panel, Lipid panel, POCT UA - Microalbumin, POCT glycosylated hemoglobin (Hb A1C)  Hyperlipidemia associated with type 2 diabetes mellitus (HCC) - Plan: Lipid panel  Hypertension associated with diabetes (HCC) - Plan: CBC with Differential/Platelet, Comprehensive metabolic panel  Perimenopausal   1. Rx changes: 800 mg of ibuprofen 3 times per day 2. Education: Reviewed 'ABCs' of diabetes management (respective goals in parentheses):  A1C (<7), blood pressure (<130/80), and cholesterol (LDL <100). 3. Compliance at present is estimated to be good. Efforts to improve compliance (if necessary) will be  directed at Continue with present activity level.  Discussed making dietary changes specifically and cutting back on high sugar containing products..  Also discussed proper sitting standing and lifting.  Recommended heat and stretching after that.   4. Follow up: 2 weeks

## 2018-07-25 LAB — CBC WITH DIFFERENTIAL/PLATELET
BASOS ABS: 0 10*3/uL (ref 0.0–0.2)
BASOS: 1 %
EOS (ABSOLUTE): 0.2 10*3/uL (ref 0.0–0.4)
Eos: 5 %
Hematocrit: 40.6 % (ref 34.0–46.6)
Hemoglobin: 13.4 g/dL (ref 11.1–15.9)
Immature Grans (Abs): 0 10*3/uL (ref 0.0–0.1)
Immature Granulocytes: 0 %
LYMPHS ABS: 2 10*3/uL (ref 0.7–3.1)
LYMPHS: 41 %
MCH: 27 pg (ref 26.6–33.0)
MCHC: 33 g/dL (ref 31.5–35.7)
MCV: 82 fL (ref 79–97)
MONOS ABS: 0.3 10*3/uL (ref 0.1–0.9)
Monocytes: 7 %
NEUTROS ABS: 2.3 10*3/uL (ref 1.4–7.0)
Neutrophils: 46 %
Platelets: 279 10*3/uL (ref 150–450)
RBC: 4.97 x10E6/uL (ref 3.77–5.28)
RDW: 14.5 % (ref 11.7–15.4)
WBC: 4.8 10*3/uL (ref 3.4–10.8)

## 2018-07-25 LAB — COMPREHENSIVE METABOLIC PANEL
A/G RATIO: 1.4 (ref 1.2–2.2)
ALK PHOS: 51 IU/L (ref 39–117)
ALT: 20 IU/L (ref 0–32)
AST: 18 IU/L (ref 0–40)
Albumin: 4.5 g/dL (ref 3.8–4.9)
BILIRUBIN TOTAL: 0.3 mg/dL (ref 0.0–1.2)
BUN/Creatinine Ratio: 13 (ref 9–23)
BUN: 10 mg/dL (ref 6–24)
CHLORIDE: 99 mmol/L (ref 96–106)
CO2: 24 mmol/L (ref 20–29)
Calcium: 10.1 mg/dL (ref 8.7–10.2)
Creatinine, Ser: 0.76 mg/dL (ref 0.57–1.00)
GFR calc non Af Amer: 90 mL/min/{1.73_m2} (ref >59)
GFR, EST AFRICAN AMERICAN: 104 mL/min/{1.73_m2} (ref >59)
GLUCOSE: 128 mg/dL — AB (ref 65–99)
Globulin, Total: 3.2 g/dL (ref 1.5–4.5)
POTASSIUM: 4.1 mmol/L (ref 3.5–5.2)
Sodium: 140 mmol/L (ref 134–144)
TOTAL PROTEIN: 7.7 g/dL (ref 6.0–8.5)

## 2018-07-25 LAB — LIPID PANEL
CHOLESTEROL TOTAL: 181 mg/dL (ref 100–199)
Chol/HDL Ratio: 4 ratio (ref 0.0–4.4)
HDL: 45 mg/dL (ref >39)
LDL Calculated: 101 mg/dL — ABNORMAL HIGH (ref 0–99)
Triglycerides: 176 mg/dL — ABNORMAL HIGH (ref 0–149)
VLDL Cholesterol Cal: 35 mg/dL (ref 5–40)

## 2018-08-07 ENCOUNTER — Ambulatory Visit: Payer: BLUE CROSS/BLUE SHIELD | Admitting: Family Medicine

## 2018-08-21 ENCOUNTER — Ambulatory Visit (INDEPENDENT_AMBULATORY_CARE_PROVIDER_SITE_OTHER): Payer: BLUE CROSS/BLUE SHIELD | Admitting: Family Medicine

## 2018-08-21 ENCOUNTER — Encounter: Payer: Self-pay | Admitting: Family Medicine

## 2018-08-21 VITALS — BP 122/80 | HR 91 | Temp 98.0°F | Wt 164.6 lb

## 2018-08-21 DIAGNOSIS — M545 Low back pain, unspecified: Secondary | ICD-10-CM

## 2018-08-21 NOTE — Progress Notes (Signed)
   Subjective:    Patient ID: Cheryl Carroll, female    DOB: 1965/10/08, 53 y.o.   MRN: [id number]  HPI She is here for recheck on her low back pain.  She states that she has a previous history of HNP but this was several years ago.  Recently she has been having a lot of right-sided low back pain with radiation down her leg.  Sitting and bending tend to make this worse.  She states that ibuprofen 800 3 times daily does get rid of 80% of her discomfort.   Review of Systems     Objective:   Physical Exam Alert and in no distress.  Normal lumbar curve and motion.  Normal hip motion.  DTRs normal.  Strength normal.  Sensation normal.  Straight leg raising is questionably positive at 75 degrees. X-rays were essentially negative       Assessment & Plan:  Acute right-sided low back pain without sciatica - Plan: Ambulatory referral to Physical Therapy I explained that physical therapy is the best way to handle this.  I will get her involved in a rehab program and recheck here in roughly 6 weeks.  She was comfortable with that.

## 2018-09-03 ENCOUNTER — Encounter: Payer: Self-pay | Admitting: Physical Therapy

## 2018-09-03 ENCOUNTER — Other Ambulatory Visit: Payer: Self-pay

## 2018-09-03 ENCOUNTER — Ambulatory Visit: Payer: BLUE CROSS/BLUE SHIELD | Attending: Family Medicine | Admitting: Physical Therapy

## 2018-09-03 DIAGNOSIS — M6281 Muscle weakness (generalized): Secondary | ICD-10-CM | POA: Diagnosis not present

## 2018-09-03 DIAGNOSIS — R262 Difficulty in walking, not elsewhere classified: Secondary | ICD-10-CM | POA: Insufficient documentation

## 2018-09-03 DIAGNOSIS — R293 Abnormal posture: Secondary | ICD-10-CM | POA: Insufficient documentation

## 2018-09-03 DIAGNOSIS — M5441 Lumbago with sciatica, right side: Secondary | ICD-10-CM | POA: Insufficient documentation

## 2018-09-03 DIAGNOSIS — G8929 Other chronic pain: Secondary | ICD-10-CM | POA: Diagnosis not present

## 2018-09-03 NOTE — Therapy (Signed)
[facility] [address] [employer] [address] [place], [place], [address] Phone: [phone]   Fax:  [phone]  Physical Therapy Evaluation  Patient Details  Name: Cheryl Carroll MRN: [id number] Date of Birth: [birth year] Referring Provider (PT): Dr. Sharlot Gowda   Encounter Date: 09/03/2018  PT End of Session - 09/03/18 1514    Visit Number  1    Number of Visits  16    Date for PT Re-Evaluation  10/29/18    PT Start Time  0845    PT Stop Time  0940    PT Time Calculation (min)  55 min    Activity Tolerance  Patient tolerated treatment well    Behavior During Therapy  [facility] for tasks assessed/performed       Past Medical History:  Diagnosis Date  . Diabetes mellitus   . Hyperlipidemia   . Hypertension   . Sarcoidosis of lung [facility])     Past Surgical History:  Procedure Laterality Date  . TUBAL LIGATION      There were no vitals filed for this visit.   Subjective Assessment - 09/03/18 0937    Subjective  Patient has had a new onset of low back pain , about 3 mos ago.  It began gradually, began to have achiness in her legs.  Pain worsened and she saw MD.  XR was normal.  She has difficulty with bending ( to the floor), sitting too much (to drive) and it is hard for her to transition.  She has pain at work as she has to stand and walk.   She endorses numbness, Rt LE weakness.      Pertinent History  diabetes, HTN, sarcoidosis    Limitations  Sitting;Standing;Walking;Lifting;House hold activities    How long can you sit comfortably?  Painful to drive 30 min     How long can you stand comfortably?  about 30 min . Needs to stand at work     How long can you walk comfortably?  about 30 min     Diagnostic tests  XR normal     Patient Stated Goals  Pain to go away so I get back to normal.     Currently in Pain?  Yes    Pain Score  8     Pain Location  Back    Pain Orientation  Lower    Pain Descriptors / Indicators  Aching;Sore    Pain Type   Chronic pain    Pain Radiating Towards  Rt thigh, calf to top of foot     Pain Onset  More than a month ago    Pain Frequency  Constant    Aggravating Factors   standing, walking, bending, sitting     Pain Relieving Factors  Aleve, has not tried her heating pad     Effect of Pain on Daily Activities  Limits her ability to walk, work         [facility] PT Assessment - 09/03/18 0001      Assessment   Medical Diagnosis  Low back pain     Referring Provider (PT)  Dr. Sharlot Gowda    Onset Date/Surgical Date  --   2009 then LE pain 3 mos ago    Hand Dominance  Left    Next MD Visit  6 weeks    Prior Therapy  No       Precautions   Precautions  None      Restrictions   Weight  Bearing Restrictions  No      Balance Screen   Has the patient fallen in the past 6 months  No      Home Environment   Living Environment  Private residence    Living Arrangements  Children    Type of Home  House    Home Access  Level entry    Additional Comments  hard for her to get dressed , put on socks       Prior Function   Level of Independence  Independent with basic ADLs;Independent with household mobility without device;Independent with community mobility without device    Vocation  Full time employment    Vocation Requirements  food prep, cook 2 jobs     Leisure  rest, has 5 grandkids       Cognition   Overall Cognitive Status  Within Functional Limits for tasks assessed      Observation/Other Assessments   Focus on Therapeutic Outcomes (FOTO)   44%      Sensation   Light Touch  Impaired by gross assessment    Additional Comments  Rt LE numb, tingling and heavy       Posture/Postural Control   Posture/Postural Control  Postural limitations    Postural Limitations  Increased lumbar lordosis      AROM   Lumbar Flexion  pressure, 50%     Lumbar Extension  25%    Lumbar - Right Side Bend  pain R    Lumbar - Left Side Bend  pain R    Lumbar - Right Rotation  pain, 50%     Lumbar - Left  Rotation  pain , 50%       Strength   Overall Strength Comments  WFL in hip flexion, knees and ankle DF       Flexibility   Hamstrings  tight       Palpation   Spinal mobility  hypomobile throughout thoracic and lumbar spine     Palpation comment  min to no pain with palpation to lumbar spine, hips, glutes       Straight Leg Raise   Findings  Negative    Side   Right      Transfers   Comments  increased effort, pain to roll and transition         Objective measurements completed on examination: See above findings.       PT Education - 09/03/18 1514    Education Details  PT/POC, HEP , disc/nerve root irritation, pain mgmt     Person(s) Educated  Patient    Methods  Explanation;Handout;Demonstration    Comprehension  Verbalized understanding;Returned demonstration;Verbal cues required       PT Short Term Goals - 09/03/18 1519      PT SHORT TERM GOAL #1   Title  Pt will be I with initial HEP for trunk, hips    Time  4    Period  Weeks    Status  New    Target Date  10/01/18      PT SHORT TERM GOAL #2   Title  Pt will be able to transition in bed with only min pain     Time  4    Period  Weeks    Status  New    Target Date  10/01/18      PT SHORT TERM GOAL #3   Title  Pt will be able to report <5/10 pain with ADLs and general mobility  tasks in her home.     Time  4    Period  Weeks    Status  New    Target Date  10/01/18      PT SHORT TERM GOAL #4   Title  Pt will report 15-20% less radicular symptoms in Rt LE for improved ambulation, activity     Time  4    Period  Weeks    Status  New    Target Date  10/01/18      PT Long Term Goals - 09/03/18 1525      PT LONG TERM GOAL #1   Title  Pt will be able to score <30% on FOTO to demo improved functional mobility    Time  8    Period  Weeks    Status  New    Target Date  10/29/18      PT LONG TERM GOAL #2   Title  Pt will be able to demo proper squat for lifting and carrying items at home     Time   8    Period  Weeks    Status  New    Target Date  10/29/18      PT LONG TERM GOAL #3   Title  Pt will be able to stand for up to 2 hours at a time with pain < 6/10 for improved work performance.     Time  8    Period  Weeks    Status  New    Target Date  10/29/18      PT LONG TERM GOAL #4   Title  Pt will be I with final HEP, concepts of posture and body mechanics for DC.     Time  8    Period  Weeks    Status  New    Target Date  10/29/18      PT LONG TERM GOAL #5   Title  Pt will report no symptoms of pain or sensory disturbance below the knee    Time  8    Period  Weeks    Status  New    Target Date  10/29/18       Plan - 09/03/18 1530    Clinical Impression Statement  Patient with history of chronic low back pain which has recently developed into radiculopathy, radiating pain into Rt buttock, leg and foot.  Strength overall is good but lacks core strength, has pain with most functional movements done today.  She needs to increase her physical activity and PT will be helpful for her as she needs guidance.  Prone did relieve most of her pain today as did the moist heat provided.     Personal Factors and Comorbidities  Education;Comorbidity 1;Comorbidity 2;Comorbidity 3+;Time since onset of injury/illness/exacerbation    Comorbidities  DM, HTN, sarcoidosis, neck pain with LUE sensory disturbance     Examination-Activity Limitations  Stand;Dressing;Bed Mobility;Hygiene/Grooming;Lift;Bend;Squat;Locomotion Level    Examination-Participation Restrictions  Shop;Driving;Other;Community Activity   Work    Stability/Clinical Decision Making  Evolving/Moderate complexity    Clinical Decision Making  Moderate    Rehab Potential  Good    PT Frequency  2x / week    PT Duration  8 weeks    PT Treatment/Interventions  ADLs/Self Care Home Management;Moist Heat;Traction;Therapeutic exercise;Therapeutic activities;Electrical Stimulation;Functional mobility training;Patient/family  education;Manual techniques;Passive range of motion;Taping;Dry needling;Cryotherapy    PT Next Visit Plan  checkHEP, develop core routine, extension based?     PT Home  Exercise Plan  POE , standind ext, childs pose     Consulted and Agree with Plan of Care  Patient       Patient will benefit from skilled therapeutic intervention in order to improve the following deficits and impairments:  Decreased mobility, Difficulty walking, Impaired sensation, Obesity, Improper body mechanics, Pain, Postural dysfunction, Impaired flexibility, Increased fascial restricitons, Decreased strength, Decreased activity tolerance, Decreased range of motion, Decreased endurance, Decreased balance  Visit Diagnosis: Chronic midline low back pain with right-sided sciatica  Abnormal posture  Muscle weakness (generalized)  Difficulty in walking, not elsewhere classified     Problem List Patient Active Problem List   Diagnosis Date Noted  . Allergic rhinitis due to pollen 12/03/2015  . ACE-inhibitor cough 12/03/2015  . Diabetes mellitus (HCC) 01/27/2011  . Hyperlipidemia associated with type 2 diabetes mellitus (HCC) 12/01/2006  . H/O sarcoidosis 08/31/2006  . Hypertension associated with diabetes (HCC) 08/31/2006    Johnrobert Foti 09/03/2018, 3:43 PM  [facility] [address] [place], [place], [address] Phone: [phone]   Fax:  [phone]  Name: Cheryl Plumengela M Cumby MRN: [id number] Date of Birth: Jan 24, 1966   Karie Mainland/Athleen Feltner, PT 09/03/18 3:44 PM Phone: [phone] Fax: [phone]

## 2018-09-04 ENCOUNTER — Ambulatory Visit: Payer: BLUE CROSS/BLUE SHIELD | Admitting: Physical Therapy

## 2018-09-04 ENCOUNTER — Encounter: Payer: Self-pay | Admitting: Physical Therapy

## 2018-09-04 DIAGNOSIS — R262 Difficulty in walking, not elsewhere classified: Secondary | ICD-10-CM

## 2018-09-04 DIAGNOSIS — G8929 Other chronic pain: Secondary | ICD-10-CM | POA: Diagnosis not present

## 2018-09-04 DIAGNOSIS — M5441 Lumbago with sciatica, right side: Secondary | ICD-10-CM

## 2018-09-04 DIAGNOSIS — M6281 Muscle weakness (generalized): Secondary | ICD-10-CM | POA: Diagnosis not present

## 2018-09-04 DIAGNOSIS — R293 Abnormal posture: Secondary | ICD-10-CM | POA: Diagnosis not present

## 2018-09-04 NOTE — Therapy (Signed)
[facility] [address] [place], [place], [address] Phone: [phone]   Fax:  [phone]  Physical Therapy Treatment  Patient Details  Name: Cheryl Carroll MRN: [id number] Date of Birth: September 07, 1965 Referring Provider (PT): Dr. Sharlot Gowda   Encounter Date: 09/04/2018  PT End of Session - 09/04/18 1034    Visit Number  2    Number of Visits  16    Date for PT Re-Evaluation  10/29/18    PT Start Time  1030    PT Stop Time  1059    PT Time Calculation (min)  29 min       Past Medical History:  Diagnosis Date  . Diabetes mellitus   . Hyperlipidemia   . Hypertension   . Sarcoidosis of lung [facility])     Past Surgical History:  Procedure Laterality Date  . TUBAL LIGATION      There were no vitals filed for this visit.  Subjective Assessment - 09/04/18 1031    Subjective  I am the same. When I left yesterday I went to the mall and could not hardly walk.     Pertinent History  diabetes, HTN, sarcoidosis    Currently in Pain?  Yes    Pain Score  5     Pain Location  Back    Pain Orientation  Lower    Pain Descriptors / Indicators  Aching;Sore    Pain Radiating Towards  rt thigh calf, top of foot                        [facility] Adult PT Treatment/Exercise - 09/04/18 0001      Self-Care   Other Self-Care Comments   Tennis ball for self trigger point release -piriformis       Exercises   Exercises  Lumbar      Lumbar Exercises: Stretches   Active Hamstring Stretch  3 reps;20 seconds    Active Hamstring Stretch Limitations  supine with hands/strap    ankle pumps   Standing Extension  3 reps;10 seconds    Standing Extension Limitations  increased right buttock pain     Piriformis Stretch  3 reps;30 seconds    Figure 4 Stretch  3 reps;20 seconds      Lumbar Exercises: Supine   Bridge  10 reps             PT Education - 09/04/18 1100    Education Details  HEP    Person(s) Educated  Patient     Methods  Explanation;Handout    Comprehension  Verbalized understanding       PT Short Term Goals - 09/03/18 1519      PT SHORT TERM GOAL #1   Title  Pt will be I with initial HEP for trunk, hips    Time  4    Period  Weeks    Status  New    Target Date  10/01/18      PT SHORT TERM GOAL #2   Title  Pt will be able to transition in bed with only min pain     Time  4    Period  Weeks    Status  New    Target Date  10/01/18      PT SHORT TERM GOAL #3   Title  Pt will be able to report <5/10 pain with ADLs and general mobility tasks in her home.  Time  4    Period  Weeks    Status  New    Target Date  10/01/18      PT SHORT TERM GOAL #4   Title  Pt will report 15-20% less radicular symptoms in Rt LE for improved ambulation, activity     Time  4    Period  Weeks    Status  New    Target Date  10/01/18        PT Long Term Goals - 09/03/18 1525      PT LONG TERM GOAL #1   Title  Pt will be able to score <30% on FOTO to demo improved functional mobility    Time  8    Period  Weeks    Status  New    Target Date  10/29/18      PT LONG TERM GOAL #2   Title  Pt will be able to demo proper squat for lifting and carrying items at home     Time  8    Period  Weeks    Status  New    Target Date  10/29/18      PT LONG TERM GOAL #3   Title  Pt will be able to stand for up to 2 hours at a time with pain < 6/10 for improved work performance.     Time  8    Period  Weeks    Status  New    Target Date  10/29/18      PT LONG TERM GOAL #4   Title  Pt will be I with final HEP, concepts of posture and body mechanics for DC.     Time  8    Period  Weeks    Status  New    Target Date  10/29/18      PT LONG TERM GOAL #5   Title  Pt will report no symptoms of pain or sensory disturbance below the knee    Time  8    Period  Weeks    Status  New    Target Date  10/29/18            Plan - 09/04/18 1128    Clinical Impression Statement  Pt arrives 15 minutes  late. She reports no change. This is her first treatment and eval was yesterday. She reports pain most significant in buttock. Able to begin piriformis stretches and isssue tennis ball for self trigger point release. She reports feeling these stretches were helping her pain.     PT Next Visit Plan  checkHEP, develop core routine, extension based?     PT Home Exercise Plan  POE , standind ext, childs pose , piriformis, figure 4 , hamstring stretch, self tennis ball to piriformis        Patient will benefit from skilled therapeutic intervention in order to improve the following deficits and impairments:  Decreased mobility, Difficulty walking, Impaired sensation, Obesity, Improper body mechanics, Pain, Postural dysfunction, Impaired flexibility, Increased fascial restricitons, Decreased strength, Decreased activity tolerance, Decreased range of motion, Decreased endurance, Decreased balance  Visit Diagnosis: Abnormal posture  Chronic midline low back pain with right-sided sciatica  Muscle weakness (generalized)  Difficulty in walking, not elsewhere classified     Problem List Patient Active Problem List   Diagnosis Date Noted  . Allergic rhinitis due to pollen 12/03/2015  . ACE-inhibitor cough 12/03/2015  . Diabetes mellitus (HCC) 01/27/2011  . Hyperlipidemia associated with type  2 diabetes mellitus (HCC) 12/01/2006  . H/O sarcoidosis 08/31/2006  . Hypertension associated with diabetes (HCC) 08/31/2006    Sherrie Mustache, PTA 09/04/2018, 11:39 AM  [facility] [address] [place], [place], [address] Phone: [phone]   Fax:  [phone]  Name: LEVON ASARO MRN: [id number] Date of Birth: 03-May-1966

## 2018-09-12 ENCOUNTER — Encounter: Payer: Self-pay | Admitting: Physical Therapy

## 2018-09-12 ENCOUNTER — Ambulatory Visit: Payer: BLUE CROSS/BLUE SHIELD | Admitting: Physical Therapy

## 2018-09-12 ENCOUNTER — Other Ambulatory Visit: Payer: Self-pay

## 2018-09-12 DIAGNOSIS — G8929 Other chronic pain: Secondary | ICD-10-CM

## 2018-09-12 DIAGNOSIS — M5441 Lumbago with sciatica, right side: Secondary | ICD-10-CM

## 2018-09-12 DIAGNOSIS — R262 Difficulty in walking, not elsewhere classified: Secondary | ICD-10-CM | POA: Diagnosis not present

## 2018-09-12 DIAGNOSIS — R293 Abnormal posture: Secondary | ICD-10-CM

## 2018-09-12 DIAGNOSIS — M6281 Muscle weakness (generalized): Secondary | ICD-10-CM | POA: Diagnosis not present

## 2018-09-12 NOTE — Therapy (Signed)
[facility] [address] [place], [place], [address] Phone: [phone]   Fax:  [phone]  Physical Therapy Treatment  Patient Details  Name: Cheryl Carroll MRN: [id number] Date of Birth: 05/01/1966 Referring Provider (PT): Dr. Sharlot Gowda   Encounter Date: 09/12/2018  PT End of Session - 09/12/18 1508    Visit Number  3    Number of Visits  16    Date for PT Re-Evaluation  10/29/18    PT Start Time  1504    PT Stop Time  1552    PT Time Calculation (min)  48 min    Activity Tolerance  Patient tolerated treatment well    Behavior During Therapy  [facility] for tasks assessed/performed       Past Medical History:  Diagnosis Date  . Diabetes mellitus   . Hyperlipidemia   . Hypertension   . Sarcoidosis of lung [facility])     Past Surgical History:  Procedure Laterality Date  . TUBAL LIGATION      There were no vitals filed for this visit.  Subjective Assessment - 09/12/18 1505    Subjective  pain is in Rt buttock and down to Right foot. pain has eased some in buttock. If I step up or down it hurts.     Patient Stated Goals  Pain to go away so I get back to normal.     Currently in Pain?  Yes    Pain Score  5     Pain Location  Hip    Pain Orientation  Right    Pain Descriptors / Indicators  Nagging    Aggravating Factors   numb after sitting    Pain Relieving Factors  has not tried heating                       [facility] Adult PT Treatment/Exercise - 09/12/18 0001      Lumbar Exercises: Stretches   Gastroc Stretch  Right;Left;2 reps;30 seconds    Gastroc Stretch Limitations  slant board, also demo in runner postiion      Lumbar Exercises: Standing   Other Standing Lumbar Exercises  hip hinge with dowel    Other Standing Lumbar Exercises  weighted ext at counter      Lumbar Exercises: Seated   Sit to Stand  10 reps    Sit to Stand Limitations  cues for glut activation      Lumbar Exercises: Prone   Other Prone Lumbar Exercises  prone on elbows- multiple through treatment to centralize pain    Other Prone Lumbar Exercises  prone press up 1x10   created peripheralized pain so stopped     Modalities   Modalities  Cryotherapy      Cryotherapy   Number Minutes Cryotherapy  10 Minutes    Cryotherapy Location  Lumbar Spine   in prone on elbows   Type of Cryotherapy  Ice pack      Manual Therapy   Manual Therapy  Joint mobilization;Soft tissue mobilization;Taping    Joint Mobilization  Lumbar PA in prone on elbows    Soft tissue mobilization  Rt lumbar paraspinals STM    Kinesiotex  Inhibit Muscle      Kinesiotix   Inhibit Muscle   star at L4             PT Education - 09/12/18 1736    Education Details  anatomy of condition- extension bias  Person(s) Educated  Patient    Methods  Explanation;Demonstration;Tactile cues;Verbal cues;Handout    Comprehension  Verbalized understanding;Need further instruction;Returned demonstration;Verbal cues required;Tactile cues required       PT Short Term Goals - 09/03/18 1519      PT SHORT TERM GOAL #1   Title  Pt will be I with initial HEP for trunk, hips    Time  4    Period  Weeks    Status  New    Target Date  10/01/18      PT SHORT TERM GOAL #2   Title  Pt will be able to transition in bed with only min pain     Time  4    Period  Weeks    Status  New    Target Date  10/01/18      PT SHORT TERM GOAL #3   Title  Pt will be able to report <5/10 pain with ADLs and general mobility tasks in her home.     Time  4    Period  Weeks    Status  New    Target Date  10/01/18      PT SHORT TERM GOAL #4   Title  Pt will report 15-20% less radicular symptoms in Rt LE for improved ambulation, activity     Time  4    Period  Weeks    Status  New    Target Date  10/01/18        PT Long Term Goals - 09/03/18 1525      PT LONG TERM GOAL #1   Title  Pt will be able to score <30% on FOTO to demo improved functional mobility     Time  8    Period  Weeks    Status  New    Target Date  10/29/18      PT LONG TERM GOAL #2   Title  Pt will be able to demo proper squat for lifting and carrying items at home     Time  8    Period  Weeks    Status  New    Target Date  10/29/18      PT LONG TERM GOAL #3   Title  Pt will be able to stand for up to 2 hours at a time with pain < 6/10 for improved work performance.     Time  8    Period  Weeks    Status  New    Target Date  10/29/18      PT LONG TERM GOAL #4   Title  Pt will be I with final HEP, concepts of posture and body mechanics for DC.     Time  8    Period  Weeks    Status  New    Target Date  10/29/18      PT LONG TERM GOAL #5   Title  Pt will report no symptoms of pain or sensory disturbance below the knee    Time  8    Period  Weeks    Status  New    Target Date  10/29/18            Plan - 09/12/18 1736    Clinical Impression Statement  discussed peripheraliztion vs centralization today and pt verbalized understanding and was better able to judge her pain with exercises. Prone on elbows consistently centralized pain and I asked her to try to do this as  soon as she can if she feels pain. tried weighted extension at counter for extension at work but was not as effective. Significant limitation in gastroc/soleus flexibility. 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     PT Treatment/Interventions  ADLs/Self Care Home Management;Moist Heat;Traction;Therapeutic exercise;Therapeutic activities;Electrical Stimulation;Functional mobility training;Patient/family education;Manual techniques;Passive range of motion;Taping;Dry needling;Cryotherapy    PT Next Visit Plan  continue extension bias program    PT Home Exercise Plan  POE , standind ext, childs pose , piriformis, figure 4 , hamstring stretch, self tennis ball to piriformis, prone on elbows, hip hinge with dowel    Consulted and Agree with Plan of  Care  Patient       Patient will benefit from skilled therapeutic intervention in order to improve the following deficits and impairments:  Decreased mobility, Difficulty walking, Impaired sensation, Obesity, Improper body mechanics, Pain, Postural dysfunction, Impaired flexibility, Increased fascial restricitons, Decreased strength, Decreased activity tolerance, Decreased range of motion, Decreased endurance, Decreased balance  Visit Diagnosis: Abnormal posture  Chronic midline low back pain with right-sided sciatica  Muscle weakness (generalized)  Difficulty in walking, not elsewhere classified     Problem List Patient Active Problem List   Diagnosis Date Noted  . Allergic rhinitis due to pollen 12/03/2015  . ACE-inhibitor cough 12/03/2015  . Diabetes mellitus (HCC) 01/27/2011  . Hyperlipidemia associated with type 2 diabetes mellitus (HCC) 12/01/2006  . H/O sarcoidosis 08/31/2006  . Hypertension associated with diabetes (HCC) 08/31/2006    Sheddrick Lattanzio C. Belladonna Lubinski PT, DPT 09/12/18 5:40 PM   [facility] [address] [place], [place], [address] Phone: [phone]   Fax:  [phone]  Name: DELANEY SEELE MRN: [id number] Date of Birth: May 14, 1966

## 2018-09-14 ENCOUNTER — Encounter

## 2018-09-17 ENCOUNTER — Other Ambulatory Visit: Payer: Self-pay | Admitting: Family Medicine

## 2018-09-17 ENCOUNTER — Encounter: Payer: BLUE CROSS/BLUE SHIELD | Admitting: Physical Therapy

## 2018-09-18 ENCOUNTER — Ambulatory Visit: Payer: BLUE CROSS/BLUE SHIELD | Admitting: Physical Therapy

## 2018-09-18 ENCOUNTER — Other Ambulatory Visit: Payer: Self-pay

## 2018-09-18 ENCOUNTER — Encounter: Payer: Self-pay | Admitting: Physical Therapy

## 2018-09-18 DIAGNOSIS — M5441 Lumbago with sciatica, right side: Secondary | ICD-10-CM

## 2018-09-18 DIAGNOSIS — R262 Difficulty in walking, not elsewhere classified: Secondary | ICD-10-CM | POA: Diagnosis not present

## 2018-09-18 DIAGNOSIS — G8929 Other chronic pain: Secondary | ICD-10-CM | POA: Diagnosis not present

## 2018-09-18 DIAGNOSIS — M6281 Muscle weakness (generalized): Secondary | ICD-10-CM

## 2018-09-18 DIAGNOSIS — R293 Abnormal posture: Secondary | ICD-10-CM | POA: Diagnosis not present

## 2018-09-18 NOTE — Therapy (Signed)
[facility] [address] [place], [place], [address] Phone: [phone]   Fax:  [phone]  Physical Therapy Treatment  Patient Details  Name: Cheryl Carroll MRN: [id number] Date of Birth: 1966/02/24 Referring Provider (PT): Dr. Sharlot Gowda   Encounter Date: 09/18/2018  PT End of Session - 09/18/18 1051    Visit Number  4    Number of Visits  16    Date for PT Re-Evaluation  10/29/18    PT Start Time  1020    PT Stop Time  1100    PT Time Calculation (min)  40 min       Past Medical History:  Diagnosis Date  . Diabetes mellitus   . Hyperlipidemia   . Hypertension   . Sarcoidosis of lung [facility])     Past Surgical History:  Procedure Laterality Date  . TUBAL LIGATION      There were no vitals filed for this visit.  Subjective Assessment - 09/18/18 1021    Currently in Pain?  Yes    Pain Score  5     Pain Location  Back   and right buttock   Pain Orientation  Right    Pain Descriptors / Indicators  Aching    Pain Radiating Towards  top of foot on right     Aggravating Factors   bending over and keeping let straight     Pain Relieving Factors  ibuprofen                       [facility] Adult PT Treatment/Exercise - 09/18/18 0001      Lumbar Exercises: Stretches   Active Hamstring Stretch  3 reps;20 seconds    Active Hamstring Stretch Limitations  supine with hands/strap    ankle pumps   Piriformis Stretch  3 reps;30 seconds    Figure 4 Stretch  3 reps;20 seconds      Lumbar Exercises: Standing   Other Standing Lumbar Exercises  standing extension x 5       Lumbar Exercises: Seated   Sit to Stand  10 reps    Sit to Stand Limitations  cues for glut activation      Lumbar Exercises: Supine   Bridge  10 reps      Lumbar Exercises: Sidelying   Clam  10 reps   bilat   Clam Limitations  reverse clam x 10    bilat     Lumbar Exercises: Prone   Straight Leg Raise  10 reps   also  alternating h/s curls withTra contract    Straight Leg Raises Limitations  alternating    Other Prone Lumbar Exercises  prone on elbows- multiple through treatment to centralize pain    Other Prone Lumbar Exercises  prone press ups , increases buttock pain, and less lower leg pain       Manual Therapy   Manual therapy comments  tennis ball trigger point release to right glut, piriformis/tack and stretch               PT Short Term Goals - 09/03/18 1519      PT SHORT TERM GOAL #1   Title  Pt will be I with initial HEP for trunk, hips    Time  4    Period  Weeks    Status  New    Target Date  10/01/18      PT SHORT TERM GOAL #2   Title  Pt will be able to transition in bed with only min pain     Time  4    Period  Weeks    Status  New    Target Date  10/01/18      PT SHORT TERM GOAL #3   Title  Pt will be able to report <5/10 pain with ADLs and general mobility tasks in her home.     Time  4    Period  Weeks    Status  New    Target Date  10/01/18      PT SHORT TERM GOAL #4   Title  Pt will report 15-20% less radicular symptoms in Rt LE for improved ambulation, activity     Time  4    Period  Weeks    Status  New    Target Date  10/01/18        PT Long Term Goals - 09/03/18 1525      PT LONG TERM GOAL #1   Title  Pt will be able to score <30% on FOTO to demo improved functional mobility    Time  8    Period  Weeks    Status  New    Target Date  10/29/18      PT LONG TERM GOAL #2   Title  Pt will be able to demo proper squat for lifting and carrying items at home     Time  8    Period  Weeks    Status  New    Target Date  10/29/18      PT LONG TERM GOAL #3   Title  Pt will be able to stand for up to 2 hours at a time with pain < 6/10 for improved work performance.     Time  8    Period  Weeks    Status  New    Target Date  10/29/18      PT LONG TERM GOAL #4   Title  Pt will be I with final HEP, concepts of posture and body mechanics for DC.      Time  8    Period  Weeks    Status  New    Target Date  10/29/18      PT LONG TERM GOAL #5   Title  Pt will report no symptoms of pain or sensory disturbance below the knee    Time  8    Period  Weeks    Status  New    Target Date  10/29/18            Plan - 09/18/18 1118    Clinical Impression Statement  Pt arrives reporting increased pain after bending over to pick something up without bending her knees. She avoids bending the right knee and kick it out to the side with bending. Reviewed sit-stand/squat mechaincs and the importance of avoiding compensstaion/ importance of body mechanics and peripheral pain. She admits to min compliance with HEP. Reviewed HEP and repeated education in the need of trial for extension benefit. After multiple prone pressups and prone hip strengthening she did note less peripheral leg pain and increased buttock pain. Reviewed all hip stretches and nerve glides. She did have increased buttock pain at end of session however no  leg pain. Encouraged compliance with HEP and extension when dhe feels peripheralization.     PT Next Visit Plan  continue extension bias program; has she been more compliant with  HEP? has extension helped centralize?    PT Home Exercise Plan  POE , standind ext, childs pose , piriformis, figure 4 , hamstring stretch, self tennis ball to piriformis, prone on elbows, hip hinge with dowel    Consulted and Agree with Plan of Care  Patient       Patient will benefit from skilled therapeutic intervention in order to improve the following deficits and impairments:  Decreased mobility, Difficulty walking, Impaired sensation, Obesity, Improper body mechanics, Pain, Postural dysfunction, Impaired flexibility, Increased fascial restricitons, Decreased strength, Decreased activity tolerance, Decreased range of motion, Decreased endurance, Decreased balance  Visit Diagnosis: Abnormal posture  Chronic midline low back pain with right-sided  sciatica  Muscle weakness (generalized)  Difficulty in walking, not elsewhere classified     Problem List Patient Active Problem List   Diagnosis Date Noted  . Allergic rhinitis due to pollen 12/03/2015  . ACE-inhibitor cough 12/03/2015  . Diabetes mellitus (HCC) 01/27/2011  . Hyperlipidemia associated with type 2 diabetes mellitus (HCC) 12/01/2006  . H/O sarcoidosis 08/31/2006  . Hypertension associated with diabetes (HCC) 08/31/2006    Sherrie Mustache, PTA 09/18/2018, 11:24 AM  [facility] [address] [place], [place], [address] Phone: [phone]   Fax:  [phone]  Name: ZOHAL REBURN MRN: [id number] Date of Birth: June 01, 1966

## 2018-09-25 ENCOUNTER — Ambulatory Visit: Payer: BLUE CROSS/BLUE SHIELD | Admitting: Physical Therapy

## 2018-09-27 ENCOUNTER — Ambulatory Visit: Payer: BLUE CROSS/BLUE SHIELD | Admitting: Physical Therapy

## 2018-10-01 ENCOUNTER — Ambulatory Visit: Payer: BLUE CROSS/BLUE SHIELD | Admitting: Physical Therapy

## 2018-10-11 ENCOUNTER — Telehealth: Payer: Self-pay | Admitting: Physical Therapy

## 2018-10-11 NOTE — Telephone Encounter (Signed)
Cheryl Carroll was contacted today regarding the temporary reduction of OP Rehab Services due to concerns for community transmission of Covid-19.    She did not answer her phone and she did not have a voicemail box set up to leave a message. A follow up e-mail was sent to the one she has on file to inform her of the changes.   Therapist advised the patient to continue to perform their HEP and assured they had no unanswered questions at this time.  The patient was offered and declined the continuation in their POC by using methods such as an e-visit, virtual check in, or telehealth visit.    Outpatient Rehabilitation Services will follow up with this client when we are able to safely resume care at the [facility] in person.   Patient is aware we can be reached by telephone during limited business hours in the meantime.

## 2018-10-15 ENCOUNTER — Ambulatory Visit (INDEPENDENT_AMBULATORY_CARE_PROVIDER_SITE_OTHER): Payer: BLUE CROSS/BLUE SHIELD | Admitting: Family Medicine

## 2018-10-15 ENCOUNTER — Other Ambulatory Visit: Payer: Self-pay

## 2018-10-15 ENCOUNTER — Encounter: Payer: Self-pay | Admitting: Family Medicine

## 2018-10-15 DIAGNOSIS — M545 Low back pain, unspecified: Secondary | ICD-10-CM

## 2018-10-15 NOTE — Progress Notes (Signed)
   Subjective:    Patient ID: Cheryl Carroll, female    DOB: 08-27-1965, 53 y.o.   MRN: [id number]  HPI Documentation for virtual telephone encounter.  Documentation for virtual audio and video telecommunications through Zoom encounter: The patient was located at home. The provider was located in the office. The patient did consent to this visit and is aware of possible charges through their insurance for this visit. The other persons participating in this telemedicine service were none. This virtual service is not related to other E/M service within previous 7 days. She continues to complain of right-sided low back pain.  She has had x-rays done which were negative.  She has been involved in physical therapy.  She uses ibuprofen but only takes 800 mg daily.  She states the pain is getting worse.    Review of Systems     Objective:   Physical Exam Alert and in no distress otherwise not examined       Assessment & Plan:  Low back pain, unspecified back pain laterality, unspecified chronicity, unspecified whether sciatica present Recommend she take a 10 mg 3 times daily of ibuprofen, continue with her physical therapy and recheck here in 1 month.  Explained that the next step would be to get an MRI.

## 2018-10-23 ENCOUNTER — Ambulatory Visit: Payer: BLUE CROSS/BLUE SHIELD | Admitting: Family Medicine

## 2018-11-07 ENCOUNTER — Telehealth: Payer: Self-pay | Admitting: Physical Therapy

## 2018-11-07 NOTE — Telephone Encounter (Signed)
Spoke with patient- she would like to continue visits in clinic. We will call her to schedule. Cheryl Carroll C. Cheryl Carroll PT, DPT 11/07/18 1:44 PM

## 2018-11-20 ENCOUNTER — Ambulatory Visit: Payer: BLUE CROSS/BLUE SHIELD | Admitting: Family Medicine

## 2018-11-20 ENCOUNTER — Other Ambulatory Visit: Payer: Self-pay

## 2018-11-20 ENCOUNTER — Telehealth: Payer: Self-pay

## 2018-11-20 DIAGNOSIS — M545 Low back pain, unspecified: Secondary | ICD-10-CM

## 2018-11-20 NOTE — Telephone Encounter (Signed)
Schedule this 

## 2018-11-20 NOTE — Telephone Encounter (Signed)
Need a peer to peer [facility]

## 2018-11-20 NOTE — Telephone Encounter (Signed)
Pt says she would like an MRI and would like to cancel her appointment for today  due to no change since last appointment. Please advise [facility]

## 2018-11-21 NOTE — Telephone Encounter (Signed)
Pt was advised insurance will not cover her MRI. Pt made an appointment for late June and will continue to follow Dr. Susann Givens advise by taking Advil. KH

## 2018-12-10 ENCOUNTER — Encounter: Payer: Self-pay | Admitting: Physical Therapy

## 2018-12-10 ENCOUNTER — Ambulatory Visit: Payer: BLUE CROSS/BLUE SHIELD | Attending: Family Medicine | Admitting: Physical Therapy

## 2018-12-10 ENCOUNTER — Other Ambulatory Visit: Payer: Self-pay

## 2018-12-10 DIAGNOSIS — G8929 Other chronic pain: Secondary | ICD-10-CM | POA: Insufficient documentation

## 2018-12-10 DIAGNOSIS — R262 Difficulty in walking, not elsewhere classified: Secondary | ICD-10-CM | POA: Diagnosis not present

## 2018-12-10 DIAGNOSIS — R293 Abnormal posture: Secondary | ICD-10-CM | POA: Diagnosis not present

## 2018-12-10 DIAGNOSIS — M6281 Muscle weakness (generalized): Secondary | ICD-10-CM | POA: Diagnosis not present

## 2018-12-10 DIAGNOSIS — M5441 Lumbago with sciatica, right side: Secondary | ICD-10-CM | POA: Diagnosis not present

## 2018-12-10 NOTE — Therapy (Signed)
[place], [place], [address] Phone: [phone]   Fax:  [phone]  Physical Therapy Treatment/Re-Evaluation   Patient Details  Name: Cheryl Carroll MRN: [id number] Date of Birth: [birth year] Referring Provider (PT): Dr. Jill Alexanders   Encounter Date: 12/10/2018  PT End of Session - 12/10/18 1345    Visit Number  5    Number of Visits  16    Date for PT Re-Evaluation  01/21/19    PT Start Time  1330    PT Stop Time  1428    PT Time Calculation (min)  58 min    Activity Tolerance  Patient tolerated treatment well    Behavior During Therapy  [facility] for tasks assessed/performed       Past Medical History:  Diagnosis Date  . Diabetes mellitus   . Hyperlipidemia   . Hypertension   . Sarcoidosis of lung [facility])     Past Surgical History:  Procedure Laterality Date  . TUBAL LIGATION      There were no vitals filed for this visit.  Subjective Assessment - 12/10/18 1334    Subjective  Patient continues to have low back pain Rt buttocks down to her leg and into her foot.  Goes back to [place] this month.  Insurance denied her MRI.      How long can you stand comfortably?  stands all the time at work, pain is severe at 30-60 min.     How long can you walk comfortably?  Works through pain , can do several minutes.  leg hurts, I keep pushing.      Patient Stated Goals  Pain to go away so I get back to normal.     Currently in Pain?  Yes    Pain Score  7     Pain Location  Buttocks    Pain Orientation  Right    Pain Descriptors / Indicators  Tingling;Stabbing;Aching;Throbbing    Pain Type  Chronic pain    Pain Radiating Towards  foot     Pain Onset  More than a month ago    Pain Frequency  Constant    Aggravating Factors   sitting increases buttock pain     Pain Relieving Factors  ibuprofen     Multiple Pain Sites  No         [facility] PT Assessment - 12/10/18 0001      AROM   Lumbar Flexion  hands to  knees , pain     Lumbar Extension  50% , pain     Lumbar - Right Side Bend  pain R     Lumbar - Left Side Bend  pain R     Lumbar - Right Rotation  pain, 25%    Lumbar - Left Rotation  pain, 25%       Strength   Right Hip Flexion  4+/5    Left Hip Flexion  5/5    Right Knee Flexion  5/5    Right Knee Extension  5/5    Left Knee Flexion  5/5    Left Knee Extension  5/5      Flexibility   Hamstrings  tight          [facility] Adult PT Treatment/Exercise - 12/10/18 0001      Self-Care   Other Self-Care Comments   body mechanics supine to sit, HEP,consistency      Lumbar Exercises: Stretches   Active Hamstring Stretch  3 reps;30 seconds    Active Hamstring Stretch Limitations  seated and supine x 3     Standing Extension  3 reps    Prone on Elbows Stretch  5 reps    Press Ups  5 reps    Piriformis Stretch  2 reps;30 seconds    Figure 4 Stretch Limitations  unable in supine       Lumbar Exercises: Aerobic   Nustep  5 min LE       Lumbar Exercises: Supine   Bridge  10 reps      Lumbar Exercises: Prone   Other Prone Lumbar Exercises  prone on elbows- multiple through treatment to centralize pain      Modalities   Modalities  Moist Heat      Moist Heat Therapy   Number Minutes Moist Heat  10 Minutes    Moist Heat Location  Lumbar Spine;Hip             PT Education - 12/10/18 1423    Education Details  HEP, POC, Self care, body mechanics     Person(s) Educated  Patient    Methods  Explanation;Handout    Comprehension  Returned demonstration;Verbalized understanding       PT Short Term Goals - 12/10/18 1351      PT SHORT TERM GOAL #1   Title  Pt will be I with initial HEP for trunk, hips    Status  On-going      PT SHORT TERM GOAL #2   Title  Pt will be able to transition in bed with only min pain     Status  On-going      PT SHORT TERM GOAL #3   Title  Pt will be able to report <5/10 pain with ADLs and general mobility tasks in her home.     Status   On-going      PT SHORT TERM GOAL #4   Title  Pt will report 15-20% less radicular symptoms in Rt LE for improved ambulation, activity     Status  On-going        PT Long Term Goals - 12/10/18 1351      PT LONG TERM GOAL #1   Title  Pt will be able to score <30% on FOTO to demo improved functional mobility    Status  On-going      PT LONG TERM GOAL #2   Title  Pt will be able to demo proper squat for lifting and carrying items at home     Status  On-going      PT LONG TERM GOAL #3   Title  Pt will be able to stand for up to 2 hours at a time with pain < 6/10 for improved work performance.     Status  On-going      PT LONG TERM GOAL #4   Title  Pt will be I with final HEP, concepts of posture and body mechanics for DC.     Status  On-going      PT LONG TERM GOAL #5   Title  Pt will report no symptoms of pain or sensory disturbance below the knee    Status  On-going            Plan - 12/10/18 1352    Clinical Impression Statement  Patient returns to PT with increased pain in Rt buttock and worsening symptoms especially when at work.  She cont to have less leg pain  when in prone.  She reports doing HEP but needed heavy cues for technique.      Personal Factors and Comorbidities  Education;Comorbidity 1;Comorbidity 2;Comorbidity 3+;Time since onset of injury/illness/exacerbation    Comorbidities  DM, HTN, sarcoidosis, neck pain with LUE sensory disturbance     Examination-Activity Limitations  Stand;Dressing;Bed Mobility;Hygiene/Grooming;Lift;Bend;Squat;Locomotion Level    Examination-Participation Restrictions  Shop;Driving;Other;Community Activity    Stability/Clinical Decision Making  Evolving/Moderate complexity    Clinical Decision Making  Moderate    Rehab Potential  Good    PT Frequency  2x / week    PT Duration  6 weeks    PT Treatment/Interventions  ADLs/Self Care Home Management;Moist Heat;Traction;Therapeutic exercise;Therapeutic activities;Electrical  Stimulation;Functional mobility training;Patient/family education;Manual techniques;Passive range of motion;Taping;Dry needling;Cryotherapy;Ultrasound    PT Next Visit Plan  continue extension bias program; hip flexibility     PT Home Exercise Plan  POE , standind ext, childs pose , piriformis, figure 4 , hamstring stretch, self tennis ball to piriformis, prone on elbows, hip hinge with dowel    Consulted and Agree with Plan of Care  Patient       Patient will benefit from skilled therapeutic intervention in order to improve the following deficits and impairments:  Decreased mobility, Difficulty walking, Impaired sensation, Obesity, Improper body mechanics, Pain, Postural dysfunction, Impaired flexibility, Increased fascial restricitons, Decreased strength, Decreased activity tolerance, Decreased range of motion, Decreased endurance, Decreased balance  Visit Diagnosis: Abnormal posture  Chronic midline low back pain with right-sided sciatica  Muscle weakness (generalized)  Difficulty in walking, not elsewhere classified     Problem List Patient Active Problem List   Diagnosis Date Noted  . Allergic rhinitis due to pollen 12/03/2015  . ACE-inhibitor cough 12/03/2015  . Diabetes mellitus (HCC) 01/27/2011  . Hyperlipidemia associated with type 2 diabetes mellitus (HCC) 12/01/2006  . H/O sarcoidosis 08/31/2006  . Hypertension associated with diabetes (HCC) 08/31/2006    Pansy Ostrovsky 12/10/2018, 2:28 PM  [facility] [address] [place], [place], [address] Phone: [phone]   Fax:  [phone]  Name: Loretta Plumengela M Parady MRN: [id number] Date of Birth: 09/28/1965  Karie MainlandJennifer Yer Castello, PT 12/10/18 2:28 PM Phone: [phone] Fax: [phone]

## 2018-12-11 ENCOUNTER — Ambulatory Visit: Payer: BLUE CROSS/BLUE SHIELD | Admitting: Physical Therapy

## 2018-12-11 ENCOUNTER — Encounter: Payer: Self-pay | Admitting: Physical Therapy

## 2018-12-11 DIAGNOSIS — R262 Difficulty in walking, not elsewhere classified: Secondary | ICD-10-CM | POA: Diagnosis not present

## 2018-12-11 DIAGNOSIS — M5441 Lumbago with sciatica, right side: Secondary | ICD-10-CM | POA: Diagnosis not present

## 2018-12-11 DIAGNOSIS — R293 Abnormal posture: Secondary | ICD-10-CM

## 2018-12-11 DIAGNOSIS — G8929 Other chronic pain: Secondary | ICD-10-CM | POA: Diagnosis not present

## 2018-12-11 DIAGNOSIS — M6281 Muscle weakness (generalized): Secondary | ICD-10-CM

## 2018-12-11 NOTE — Therapy (Signed)
[place], [place], [address] Phone: [phone]   Fax:  [phone]  Physical Therapy Treatment  Patient Details  Name: Cheryl Carroll MRN: [id number] Date of Birth: Nov 14, 1965 Referring Provider (PT): Dr. Jill Alexanders   Encounter Date: 12/11/2018  PT End of Session - 12/11/18 1005    Visit Number  6    Number of Visits  16    Date for PT Re-Evaluation  01/21/19    PT Start Time  0932    PT Stop Time  1028    PT Time Calculation (min)  56 min    Activity Tolerance  Patient tolerated treatment well    Behavior During Therapy  [facility] for tasks assessed/performed       Past Medical History:  Diagnosis Date  . Diabetes mellitus   . Hyperlipidemia   . Hypertension   . Sarcoidosis of lung [facility])     Past Surgical History:  Procedure Laterality Date  . TUBAL LIGATION      There were no vitals filed for this visit.  Subjective Assessment - 12/11/18 0934    Subjective  More pain in Rt buttock than back. I felt good after yesterday until 2AM when I woke up in pain- was laying on Lt side.     Patient Stated Goals  Pain to go away so I get back to normal.     Currently in Pain?  Yes    Pain Score  5     Pain Location  Buttocks    Pain Orientation  Right    Pain Descriptors / Indicators  Stabbing    Aggravating Factors   night pain    Pain Relieving Factors  ibuprofen                       [facility] Adult PT Treatment/Exercise - 12/11/18 0001      Lumbar Exercises: Stretches   Piriformis Stretch Limitations  push & pull across    Other Lumbar Stretch Exercise  supine piriformis stretch over towel roll-= 3 min, cues for breathing      Lumbar Exercises: Supine   AB Set Limitations  hooklying ball squeeze with Transv abd set    Other Supine Lumbar Exercises  hooklying ball squeeze with breathing- 3 min      Lumbar Exercises: Prone   Other Prone Lumbar Exercises  prone on elbows      Moist Heat Therapy   Number Minutes Moist Heat  15 Minutes    Moist Heat Location  Lumbar Spine;Hip      Manual Therapy   Manual therapy comments  skilled palpation and monitoring during TPDN    Soft tissue mobilization  Rt piriformis       Trigger Point Dry Needling - 12/11/18 0001    Consent Given?  Yes    Education Handout Provided  --   verbal education   Muscles Treated Back/Hip  Gluteus maximus;Lumbar multifidi    Gluteus Maximus Response  Twitch response elicited;Palpable increased muscle length   Rt   Lumbar multifidi Response  Twitch response elicited;Palpable increased muscle length   Rt L4          PT Education - 12/11/18 1016    Education Details  TPDN & expected outcomes. exercise form/rationale    Person(s) Educated  Patient    Methods  Explanation    Comprehension  Verbalized understanding;Need further instruction       PT  Short Term Goals - 12/10/18 1351      PT SHORT TERM GOAL #1   Title  Pt will be I with initial HEP for trunk, hips    Status  On-going      PT SHORT TERM GOAL #2   Title  Pt will be able to transition in bed with only min pain     Status  On-going      PT SHORT TERM GOAL #3   Title  Pt will be able to report <5/10 pain with ADLs and general mobility tasks in her home.     Status  On-going      PT SHORT TERM GOAL #4   Title  Pt will report 15-20% less radicular symptoms in Rt LE for improved ambulation, activity     Status  On-going        PT Long Term Goals - 12/10/18 1351      PT LONG TERM GOAL #1   Title  Pt will be able to score <30% on FOTO to demo improved functional mobility    Status  On-going      PT LONG TERM GOAL #2   Title  Pt will be able to demo proper squat for lifting and carrying items at home     Status  On-going      PT LONG TERM GOAL #3   Title  Pt will be able to stand for up to 2 hours at a time with pain < 6/10 for improved work performance.     Status  On-going      PT LONG TERM GOAL #4    Title  Pt will be I with final HEP, concepts of posture and body mechanics for DC.     Status  On-going      PT LONG TERM GOAL #5   Title  Pt will report no symptoms of pain or sensory disturbance below the knee    Status  On-going            Plan - 12/11/18 1014    Clinical Impression Statement  good tolerance to TPDN with reports of decreased pain. Continued with prone extension and added stretches to decrease spasm in gluts and piriformis. Pt reported feeling better after treatment today.     PT Treatment/Interventions  ADLs/Self Care Home Management;Moist Heat;Traction;Therapeutic exercise;Therapeutic activities;Electrical Stimulation;Functional mobility training;Patient/family education;Manual techniques;Passive range of motion;Taping;Dry needling;Cryotherapy;Ultrasound    PT Next Visit Plan  ext bias, outcome of DN? continue PRN. core activation    PT Home Exercise Plan  POE , standind ext, childs pose , piriformis, figure 4 , hamstring stretch, self tennis ball to piriformis, prone on elbows, hip hinge with dowel    Consulted and Agree with Plan of Care  Patient       Patient will benefit from skilled therapeutic intervention in order to improve the following deficits and impairments:  Decreased mobility, Difficulty walking, Impaired sensation, Obesity, Improper body mechanics, Pain, Postural dysfunction, Impaired flexibility, Increased fascial restricitons, Decreased strength, Decreased activity tolerance, Decreased range of motion, Decreased endurance, Decreased balance  Visit Diagnosis: Abnormal posture  Chronic midline low back pain with right-sided sciatica  Muscle weakness (generalized)  Difficulty in walking, not elsewhere classified     Problem List Patient Active Problem List   Diagnosis Date Noted  . Allergic rhinitis due to pollen 12/03/2015  . ACE-inhibitor cough 12/03/2015  . Diabetes mellitus (HCC) 01/27/2011  . Hyperlipidemia associated with type 2  diabetes mellitus (HCC)  12/01/2006  . H/O sarcoidosis 08/31/2006  . Hypertension associated with diabetes (HCC) 08/31/2006  Jaleiah Asay C. Lainee Lehrman PT, DPT 12/11/18 10:17 AM   [facility] [address] [place], [place], [address] Phone: [phone]   Fax:  [phone]  Name: Loretta Plumengela M Roehr MRN: [id number] Date of Birth: July 14, 1965

## 2018-12-24 ENCOUNTER — Ambulatory Visit: Payer: Self-pay | Admitting: Family Medicine

## 2018-12-26 ENCOUNTER — Other Ambulatory Visit: Payer: Self-pay

## 2018-12-26 ENCOUNTER — Ambulatory Visit: Payer: BLUE CROSS/BLUE SHIELD | Admitting: Physical Therapy

## 2018-12-26 DIAGNOSIS — R262 Difficulty in walking, not elsewhere classified: Secondary | ICD-10-CM

## 2018-12-26 DIAGNOSIS — M6281 Muscle weakness (generalized): Secondary | ICD-10-CM

## 2018-12-26 DIAGNOSIS — G8929 Other chronic pain: Secondary | ICD-10-CM

## 2018-12-26 DIAGNOSIS — M5441 Lumbago with sciatica, right side: Secondary | ICD-10-CM

## 2018-12-26 DIAGNOSIS — R293 Abnormal posture: Secondary | ICD-10-CM | POA: Diagnosis not present

## 2018-12-26 NOTE — Therapy (Addendum)
[place], [place], [address] Phone: [phone]   Fax:  [phone]  Physical Therapy Treatment/Discharge  Patient Details  Name: Cheryl Carroll MRN: [id number] Date of Birth: 10/02/1965 Referring Provider (PT): Dr. Jill Carroll   Encounter Date: 12/26/2018  PT End of Session - 12/26/18 1337    Visit Number  7    Number of Visits  16    Date for PT Re-Evaluation  01/21/19    PT Start Time  1336    PT Stop Time  1419    PT Time Calculation (min)  43 min    Activity Tolerance  Patient tolerated treatment well    Behavior During Therapy  [facility] for tasks assessed/performed       Past Medical History:  Diagnosis Date  . Diabetes mellitus   . Hyperlipidemia   . Hypertension   . Sarcoidosis of lung [facility])     Past Surgical History:  Procedure Laterality Date  . TUBAL LIGATION      There were no vitals filed for this visit.  Subjective Assessment - 12/26/18 1338    Subjective  It seems like the heat is most effective. I did feel some pain in lower back after last time. I havent taken any pills today.    Patient Stated Goals  Pain to go away so I get back to normal.     Currently in Pain?  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 [employer]      Lumbar Exercises: Standing   Other Standing Lumbar Exercises  shoulder flexion, back to wall    Other Standing Lumbar Exercises  weighted extension   pain centralized, multiple in clinic     Lumbar Exercises: Seated   Other Seated Lumbar Exercises  posture with pillow & feet flat      Lumbar Exercises: Prone   Other  Prone Lumbar Exercises  prone on elbows   peripheralized pain to calf     Cryotherapy   Number Minutes Cryotherapy  10 Minutes   concurrent with education   Cryotherapy Location  Lumbar Spine    Type of Cryotherapy  Ice pack      K[employer]   Inhibit Muscle   star at L4             PT Education - 12/26/18 1412    Education Details  anatomy of condition, work activities, ice vs heat, POC    Person(s) Educated  Patient    Methods  Explanation    Comprehension  Verbalized understanding;Need further instruction       PT Short Term Goals - 12/10/18 1351      PT SHORT TERM GOAL #1   Title  Pt will be I with initial HEP for trunk, hips    Status  On-going      PT SHORT TERM GOAL #2   Title  Pt will be able to transition in bed  with only min pain     Status  On-going      PT SHORT TERM GOAL #3   Title  Pt will be able to report <5/10 pain with ADLs and general mobility tasks in her home.     Status  On-going      PT SHORT TERM GOAL #4   Title  Pt will report 15-20% less radicular symptoms in Rt LE for improved ambulation, activity     Status  On-going        PT Long Term Goals - 12/10/18 1351      PT LONG TERM GOAL #1   Title  Pt will be able to score <30% on FOTO to demo improved functional mobility    Status  On-going      PT LONG TERM GOAL #2   Title  Pt will be able to demo proper squat for lifting and carrying items at home     Status  On-going      PT LONG TERM GOAL #3   Title  Pt will be able to stand for up to 2 hours at a time with pain < 6/10 for improved work performance.     Status  On-going      PT LONG TERM GOAL #4   Title  Pt will be I with final HEP, concepts of posture and body mechanics for DC.     Status  On-going      PT LONG TERM GOAL #5   Title  Pt will report no symptoms of pain or sensory disturbance below the knee    Status  On-going            Plan - 12/26/18 1422    Clinical Impression Statement  Pt arrived in 9/10 pain  with radicular symptoms to Rt calf. Able to reduce to "near pain-free" and radicular symptoms to mid-buttock at the end of the session. pain peripheralized with unweighted extension but centralized with weighted. pt understands that she will do standing extensions any time pain begins to peripheralize, or every 20 min, whichever comes first today at work.    PT Treatment/Interventions  ADLs/Self Care Home Management;Moist Heat;Traction;Therapeutic exercise;Therapeutic activities;Electrical Stimulation;Functional mobility training;Patient/family education;Manual techniques;Passive range of motion;Taping;Dry needling;Cryotherapy;Ultrasound    PT Next Visit Plan  weighted extension work    PT Home Exercise Plan  POE , standind ext, childs pose , piriformis, figure 4 , hamstring stretch, self tennis ball to piriformis, prone on elbows, hip hinge with dowel    Consulted and Agree with Plan of Care  Patient       Patient will benefit from skilled therapeutic intervention in order to improve the following deficits and impairments:  Decreased mobility, Difficulty walking, Impaired sensation, Obesity, Improper body mechanics, Pain, Postural dysfunction, Impaired flexibility, Increased fascial restricitons, Decreased strength, Decreased activity tolerance, Decreased range of motion, Decreased endurance, Decreased balance  Visit Diagnosis: 1. Abnormal posture   2. Chronic midline low back pain with right-sided sciatica   3. Muscle weakness (generalized)   4. Difficulty in walking, not elsewhere classified        Problem List Patient Active Problem List   Diagnosis Date Noted  . Allergic rhinitis due to pollen 12/03/2015  . ACE-inhibitor cough 12/03/2015  . Diabetes mellitus ([place]) 01/27/2011  . Hyperlipidemia associated with type 2 diabetes mellitus ([place]) 12/01/2006  . H/O sarcoidosis 08/31/2006  . Hypertension associated with diabetes ([place]) 08/31/2006    [name] C. [name] PT, DPT 12/26/18  2:26  PM   [name], [place], [address] Phone: [phone]   Fax:  [phone]  Name: Cheryl Carroll MRN: [id number] Date of Birth: 01/09/1966  PHYSICAL THERAPY DISCHARGE SUMMARY  Visits from Start of Care: 7  Current functional level related to goals / functional outcomes: See above   Remaining deficits: See above   Education / Equipment: Anatomy of condiiton, POC, HEP, exercise form/rationale  Plan: Patient agrees to discharge.  Patient goals were not met. Patient is being discharged due to not returning since the last visit.  ?????     Synai Prettyman C. Majesti Gambrell PT, DPT 03/05/19 5:16 PM

## 2018-12-31 ENCOUNTER — Telehealth: Payer: Self-pay | Admitting: *Deleted

## 2018-12-31 ENCOUNTER — Telehealth: Payer: Self-pay | Admitting: Family Medicine

## 2018-12-31 ENCOUNTER — Telehealth: Payer: Self-pay

## 2018-12-31 ENCOUNTER — Other Ambulatory Visit: Payer: Self-pay

## 2018-12-31 DIAGNOSIS — Z20822 Contact with and (suspected) exposure to covid-19: Secondary | ICD-10-CM

## 2018-12-31 DIAGNOSIS — Z20828 Contact with and (suspected) exposure to other viral communicable diseases: Secondary | ICD-10-CM

## 2018-12-31 NOTE — Telephone Encounter (Signed)
Orders were put in and pt was advised she should get a call from health at work. Cheryl Carroll

## 2018-12-31 NOTE — Telephone Encounter (Signed)
Message Received: Today Message Contents  Cheryl Carroll, RMA  [facility] Testing Pool        Please advise of anything needed. Thank you in advance .   Cheryl Carroll RMA   Pt of Dr. Jill Alexanders.  Pt called and testing scheduled at [facility] site on 01/01/19. Pt advised to wear a mask and remain in car at appt time. Understanding verbalized.

## 2018-12-31 NOTE — Telephone Encounter (Signed)
Pt called and stated her daughter test positive for COVD and she lives with her. Pt is not currently having symptoms but would like to be tested. Please advise.

## 2018-12-31 NOTE — Telephone Encounter (Signed)
Dismissal letter in guarantor snapshot  °

## 2018-12-31 NOTE — Telephone Encounter (Signed)
The CDC recommendation is to quarantine for 14 days

## 2019-01-01 ENCOUNTER — Other Ambulatory Visit: Payer: BLUE CROSS/BLUE SHIELD

## 2019-01-01 DIAGNOSIS — Z20822 Contact with and (suspected) exposure to covid-19: Secondary | ICD-10-CM

## 2019-01-01 DIAGNOSIS — R6889 Other general symptoms and signs: Secondary | ICD-10-CM | POA: Diagnosis not present

## 2019-01-05 LAB — NOVEL CORONAVIRUS, NAA: SARS-CoV-2, NAA: NOT DETECTED

## 2019-01-21 ENCOUNTER — Ambulatory Visit: Payer: Self-pay | Admitting: Family Medicine

## 2019-01-31 DIAGNOSIS — B342 Coronavirus infection, unspecified: Secondary | ICD-10-CM | POA: Diagnosis not present

## 2019-02-26 DIAGNOSIS — U071 COVID-19: Secondary | ICD-10-CM | POA: Diagnosis not present

## 2019-02-26 DIAGNOSIS — Z20828 Contact with and (suspected) exposure to other viral communicable diseases: Secondary | ICD-10-CM | POA: Diagnosis not present

## 2019-03-06 DIAGNOSIS — U071 COVID-19: Secondary | ICD-10-CM | POA: Diagnosis not present

## 2019-03-13 DIAGNOSIS — U071 COVID-19: Secondary | ICD-10-CM | POA: Diagnosis not present

## 2019-03-20 DIAGNOSIS — U071 COVID-19: Secondary | ICD-10-CM | POA: Diagnosis not present

## 2019-03-25 ENCOUNTER — Ambulatory Visit: Payer: BLUE CROSS/BLUE SHIELD | Admitting: Family Medicine

## 2019-03-27 DIAGNOSIS — U071 COVID-19: Secondary | ICD-10-CM | POA: Diagnosis not present

## 2019-03-27 DIAGNOSIS — Z20828 Contact with and (suspected) exposure to other viral communicable diseases: Secondary | ICD-10-CM | POA: Diagnosis not present

## 2019-04-01 ENCOUNTER — Encounter: Payer: Self-pay | Admitting: Family Medicine

## 2019-04-01 ENCOUNTER — Ambulatory Visit: Payer: BLUE CROSS/BLUE SHIELD | Admitting: Family Medicine

## 2019-04-01 ENCOUNTER — Other Ambulatory Visit: Payer: Self-pay

## 2019-04-01 VITALS — BP 110/65 | HR 80 | Temp 98.1°F | Wt 165.0 lb

## 2019-04-01 DIAGNOSIS — Z1159 Encounter for screening for other viral diseases: Secondary | ICD-10-CM

## 2019-04-01 DIAGNOSIS — E1169 Type 2 diabetes mellitus with other specified complication: Secondary | ICD-10-CM | POA: Diagnosis not present

## 2019-04-01 DIAGNOSIS — Z23 Encounter for immunization: Secondary | ICD-10-CM

## 2019-04-01 DIAGNOSIS — E1159 Type 2 diabetes mellitus with other circulatory complications: Secondary | ICD-10-CM

## 2019-04-01 DIAGNOSIS — I10 Essential (primary) hypertension: Secondary | ICD-10-CM

## 2019-04-01 DIAGNOSIS — E119 Type 2 diabetes mellitus without complications: Secondary | ICD-10-CM | POA: Diagnosis not present

## 2019-04-01 DIAGNOSIS — E785 Hyperlipidemia, unspecified: Secondary | ICD-10-CM

## 2019-04-01 DIAGNOSIS — Z1211 Encounter for screening for malignant neoplasm of colon: Secondary | ICD-10-CM

## 2019-04-01 DIAGNOSIS — I152 Hypertension secondary to endocrine disorders: Secondary | ICD-10-CM

## 2019-04-01 DIAGNOSIS — M5431 Sciatica, right side: Secondary | ICD-10-CM | POA: Insufficient documentation

## 2019-04-01 LAB — POCT GLYCOSYLATED HEMOGLOBIN (HGB A1C): HbA1c, POC (controlled diabetic range): 7.4 % — AB (ref 0.0–7.0)

## 2019-04-01 MED ORDER — ROSUVASTATIN CALCIUM 20 MG PO TABS: 20.0000 mg | ORAL_TABLET | Freq: Every day | ORAL | 2 refills | Status: DC

## 2019-04-01 MED ORDER — GABAPENTIN 300 MG PO CAPS: 300.0000 mg | ORAL_CAPSULE | Freq: Every day | ORAL | 3 refills | Status: DC

## 2019-04-01 NOTE — Patient Instructions (Addendum)
Thank you for coming to see me today. It was a pleasure! Today we talked about:   Please take your metformin 500mg  once daily.  Please start taking your rosuvastatin daily as well.  For your right sciatica pain.  Please start taking Advil 3 pills 3 times per day.  In between taking the Advil you should also take Tylenol.  You may take 650 mg or 2 tablets as we discussed.  These medication should work together in order to help your pain.  We will also trial gabapentin 300 mg at night before bed to see if this helps with the pain.  Would also like to get x-rays of your hip in order to ensure that it is not coming from your hip.  I would like for you to schedule a virtual visit with me in 4 weeks and at that point we may need to send you to the sports medicine clinic as discussed.  We will call you with your lab results.  You are also due for a screening colonoscopy.  I have placed a referral for the belly doctors to call you in order to schedule this.  Please follow-up with me for a virtual visit in 4 weeks or sooner as needed.  If you have any questions or concerns, please do not hesitate to call the office at [phone].  Take Care,   Martin[place] Lace Chenevert, DO  Piriformis Syndrome  Piriformis syndrome is a condition that can cause pain and numbness in your buttocks and down the back of your leg. Piriformis syndrome happens when the small muscle that connects the base of your spine to your hip (piriformis muscle) presses on the nerve that runs down the back of your leg (sciatic nerve). The piriformis muscle helps your hip rotate and helps to bring your leg back and out. It also helps shift your weight to keep you stable while you are walking. The sciatic nerve runs under or through the piriformis muscle. Damage to the piriformis muscle can cause spasms that put pressure on the nerve below. This causes pain and discomfort while sitting and moving. The pain may feel as if it begins in the buttock and  spreads (radiates) down your hip and thigh. What are the causes? This condition is caused by pressure on the sciatic nerve from the piriformis muscle. The piriformis muscle can get irritated with overuse, especially if other hip muscles are weak and the piriformis muscle has to do extra work. Piriformis syndrome can also occur after an injury, like a fall onto your buttocks. What increases the risk? You are more likely to develop this condition if you:  Are a woman.  Sit for long periods of time.  Are a cyclist.  Have weak buttocks muscles (gluteal muscles). What are the signs or symptoms? Symptoms of this condition include:  Pain, tingling, or numbness that starts in the buttock and runs down the back of your leg (sciatica).  Pain in the groin or thigh area. Your symptoms may get worse:  The longer you sit.  When you walk, run, or climb stairs.  When straining to have a bowel movement. How is this diagnosed? This condition is diagnosed based on your symptoms, medical history, and physical exam.  During the exam, your health care provider may: ? Move your leg into different positions to check for pain. ? Press on the muscles of your hip and buttock to see if that increases your symptoms.  You may also have tests, including: ?  Imaging tests such as X-rays, MRI, or ultrasound. ? Electromyogram (EMG). This test measures electrical signals sent by your nerves into the muscles. ? Nerve conduction study. This test measures how well electrical signals pass through your nerves. How is this treated? This condition may be treated by:  Stopping all activities that cause pain or make your condition worse.  Applying ice or using heat therapy.  Taking medicines to reduce pain and swelling.  Taking a muscle relaxer (muscle relaxant) to stop muscle spasms.  Doing range-of-motion and strengthening exercises (physical therapy) as told by your health care provider.  Massaging the  area.  Having acupuncture.  Getting an injection of medicine in the piriformis muscle. Your health care provider will choose the medicine based on your condition. He or she may inject: ? An anti-inflammatory medicine (steroid) to reduce swelling. ? A numbing medicine (local anesthetic) to block the pain. ? Botulinum toxin. The toxin blocks nerve impulses to specific muscles to reduce muscle tension. In rare cases, you may need surgery to cut the muscle and release pressure on the nerve if other treatments do not work. Follow these instructions at home: Activity  Do not sit for long periods. Get up and walk around every 20 minutes or as often as told by your health care provider. ? When driving long distances, make sure to take frequent stops to get up and stretch.  Use a cushion when you sit on hard surfaces.  Do exercises as told by your health care provider.  Return to your normal activities as told by your health care provider. Ask your health care provider what activities are safe for you. Managing pain, stiffness, and swelling      If directed, apply heat to the affected area as often as told by your health care provider. Use the heat source that your health care provider recommends, such as a moist heat pack or a heating pad. ? Place a towel between your skin and the heat source. ? Leave the heat on for 20-30 minutes. ? Remove the heat if your skin turns bright red. This is especially important if you are unable to feel pain, heat, or cold. You may have a greater risk of getting burned.  If directed, put ice on the injured area. ? Put ice in a plastic bag. ? Place a towel between your skin and the bag. ? Leave the ice on for 20 minutes, 2-3 times a day. General instructions  Take over-the-counter and prescription medicines only as told by your health care provider.  Ask your health care provider if the medicine prescribed to you requires you to avoid driving or using heavy  machinery.  You may need to take actions to prevent or treat constipation, such as: ? Drink enough fluid to keep your urine pale yellow. ? Take over-the-counter or prescription medicines. ? Eat foods that are high in fiber, such as beans, whole grains, and fresh fruits and vegetables. ? Limit foods that are high in fat and processed sugars, such as fried or sweet foods.  Keep all follow-up visits as told by your health care provider. This is important. How is this prevented?  Do not sit for longer than 20 minutes at a time. When you sit, choose padded surfaces.  Warm up and stretch before being active.  Cool down and stretch after being active.  Give your body time to rest between periods of activity.  Make sure to use equipment that fits you.  Maintain physical fitness,  including: ? Strength. ? Flexibility. Contact a health care provider if:  Your pain and stiffness continue or get worse.  Your leg or hip becomes weak.  You have changes in your bowel function or bladder function. Summary  Piriformis syndrome is a condition that can cause pain, tingling, and numbness in your buttocks and down the back of your leg.  You may try applying heat or ice to relieve the pain.  Do not sit for long periods. Get up and walk around every 20 minutes or as often as told by your health care provider. This information is not intended to replace advice given to you by your health care provider. Make sure you discuss any questions you have with your health care provider. Document Released: 06/20/2005 Document Revised: 10/11/2018 Document Reviewed: 02/14/2018 Elsevier Patient Education  2020 [employer].

## 2019-04-01 NOTE — Assessment & Plan Note (Signed)
Will restart patient on Crestor 20 mg which she previously tolerated

## 2019-04-01 NOTE — Progress Notes (Signed)
Subjective:    Patient ID: Cheryl Carroll, female    DOB: 08-Aug-1965, 52 y.o.   MRN: [id number]  Cheryl Carroll is a 53 y.o. female with a PMH of hypertension, HLD, T2DM, history of sarcoidosis of the lung, here today for establishing care, sciatica.   CC: sciatica  HPI:  Diabetes Medications: Has not been taking her metformin because she does not like the taste of the pill hpoglycemic symptoms-no On Aspirin, and has not been taking her statin Last eye exam:  Like referral to an eye doctor today Last foot exam: up to date ROS: denies dizziness, diaphoresis, LOC, polyuria, polydipsia  Monitoring Labs and Parameters Last A1C:  Lab Results  Component Value Date   HGBA1C 7.4 (A) 04/01/2019   Last Lipid:     Component Value Date/Time   CHOL 181 07/24/2018 1045   HDL 45 07/24/2018 1045     Hypertension: - Medications: Olmesartan-hydrochlorothiazide 40-12.5 - Compliance: Yes - Checking BP at home: No - Denies any SOB, CP, vision changes, LE edema, medication SEs, or symptoms of hypotension  R Sciatica: -Pain in R side of butt and shoots down to her leg. When she touches her legs it feels tingling -Started last year. She has done PT- but did not help her. Tried heating pads.  -Worse with standing, better laying, periodically wakes her up -Denies any bowel or bladder incontinence, weakness -Pain was mild, but has been worsening  -Tried advil (4 TID), ibuprofen. She eats with it. Took tylenol but does not notice helping.  Review of Systems  Constitutional: Negative.   HENT: Negative.   Eyes: Negative.   Respiratory: Negative.   Cardiovascular: Negative.   Gastrointestinal: Negative.   Genitourinary: Negative.   Musculoskeletal: Negative.   Skin: Negative.   Neurological: Positive for tingling. Negative for weakness.  Endo/Heme/Allergies: Negative.   Psychiatric/Behavioral: Negative.    Patient Active Problem List   Diagnosis Date Noted  . Sciatica of right side  04/01/2019  . Allergic rhinitis due to pollen 12/03/2015  . Diabetes mellitus ([facility]) 01/27/2011  . Hyperlipidemia associated with type 2 diabetes mellitus ([facility]) 12/01/2006  . H/O sarcoidosis 08/31/2006  . Hypertension associated with diabetes ([facility]) 08/31/2006    Family History  Problem Relation Age of Onset  . Diabetes Mother   . Hyperlipidemia Mother   . Hypertension Mother   . Cancer Mother 97       Breast cancer  . Hyperlipidemia Father   . Hypertension Father   . Diabetes Father    Past Medical History:  Diagnosis Date  . ACE-inhibitor cough 12/03/2015  . Diabetes mellitus   . Hyperlipidemia   . Hypertension   . Sarcoidosis of lung ([facility])    Social Hx: Denies tobacco use, alcohol use, or illicit drug use.  Objective:  BP 110/65   Pulse 80   Temp 98.1 F (36.7 C) (Oral)   Wt 165 lb (74.8 kg)   SpO2 98%   BMI 30.18 kg/m  Vitals and nursing note reviewed  General: NAD, pleasant Head: Atraumatic Neck: Supple Cardiac: RRR, normal heart sounds, no murmurs Respiratory: CTAB, normal effort Extremities: no edema or cyanosis. WWP.  Strength 5/5 in BLLE MSK: normal gait, no tenderness along middle of back Skin: warm and dry, no rashes noted Neuro: alert and oriented, no focal deficits Psych: Neatly groomed and appropriately dressed. Maintains good eye contact and is cooperative and attentive. Speech is normal volume and rate. Denies SI/ HI. Normal affect.  Assessment & Plan:  Diabetes mellitus A1c is 7.4 today.  Patient counseled on restarting her metformin and suggested that she take this with food which would coat the pill in order for her to be able to not taste it.  Hypertension associated with diabetes ([place]) Continue current management with olmesartan-hydrochlorothiazide 40-12.5  Hyperlipidemia associated with type 2 diabetes mellitus ([place]) Will restart patient on Crestor 20 mg which she previously tolerated  Sciatica of right side Patient with history of  suggestive of sciatica and for the past year has been increasingly worsening.  Pain controlled well with Advil however given patient's history of high blood pressure is not indicated long-term.  She has also previously worked with physical therapy without improvement. DG lumbar spine grossly normal in January 2020.  MRI ordered in May but unable to afford.  No red flag symptoms and less concern for lumbar etiology given history and exam findings. Patient with no anterior leg pain but also could be coming from hip. Could also be coming from piriformis as patient notes tenderness over location of piriformis. -Will obtain DG hip -Will start patient by decreasing her Advil to 3 pills TID and alternating with Tylenol 650 TID.   -Patient also will try gabapentin nightly at 300 mg.  -Virtual visit for follow-up in 4 weeks -If patient does not improve with conservative management will refer to sports medicine   Health maintenance Patient is due for colonoscopy but would prefer to do FIT testing instead.  Provided with information for this. Influenza given today, hep C testing performed  Martinique Laden [place], DO Family Medicine Resident, PGY-3

## 2019-04-01 NOTE — Assessment & Plan Note (Signed)
A1c is 7.4 today.  Patient counseled on restarting her metformin and suggested that she take this with food which would coat the pill in order for her to be able to not taste it.

## 2019-04-01 NOTE — Assessment & Plan Note (Addendum)
Patient with history of suggestive of sciatica and for the past year has been increasingly worsening.  Pain controlled well with Advil however given patient's history of high blood pressure is not indicated long-term.  She has also previously worked with physical therapy without improvement. DG lumbar spine grossly normal in January 2020.  MRI ordered in May but unable to afford.  No red flag symptoms and less concern for lumbar etiology given history and exam findings. Patient with no anterior leg pain but also could be coming from hip. Could also be coming from piriformis as patient notes tenderness over location of piriformis. -Will obtain DG hip -Will start patient by decreasing her Advil to 3 pills TID and alternating with Tylenol 650 TID.   -Patient also will try gabapentin nightly at 300 mg.  -Virtual visit for follow-up in 4 weeks -If patient does not improve with conservative management will refer to sports medicine

## 2019-04-01 NOTE — Assessment & Plan Note (Signed)
Continue current management with olmesartan-hydrochlorothiazide 40-12.5

## 2019-04-02 LAB — COMPREHENSIVE METABOLIC PANEL
ALT: 17 IU/L (ref 0–32)
AST: 18 IU/L (ref 0–40)
Albumin/Globulin Ratio: 1.8 (ref 1.2–2.2)
Albumin: 4.9 g/dL (ref 3.8–4.9)
Alkaline Phosphatase: 53 IU/L (ref 39–117)
BUN/Creatinine Ratio: 15 (ref 9–23)
BUN: 12 mg/dL (ref 6–24)
Bilirubin Total: 0.3 mg/dL (ref 0.0–1.2)
CO2: 25 mmol/L (ref 20–29)
Calcium: 9.8 mg/dL (ref 8.7–10.2)
Chloride: 102 mmol/L (ref 96–106)
Creatinine, Ser: 0.78 mg/dL (ref 0.57–1.00)
GFR calc Af Amer: 100 mL/min/{1.73_m2} (ref >59)
GFR calc non Af Amer: 87 mL/min/{1.73_m2} (ref >59)
Globulin, Total: 2.7 g/dL (ref 1.5–4.5)
Glucose: 125 mg/dL — ABNORMAL HIGH (ref 65–99)
Potassium: 4.2 mmol/L (ref 3.5–5.2)
Sodium: 140 mmol/L (ref 134–144)
Total Protein: 7.6 g/dL (ref 6.0–8.5)

## 2019-04-02 LAB — HEPATITIS C ANTIBODY: Hep C Virus Ab: <0.1 s/co ratio (ref 0.0–0.9)

## 2019-04-03 ENCOUNTER — Encounter: Payer: Self-pay | Admitting: Family Medicine

## 2019-04-03 ENCOUNTER — Other Ambulatory Visit: Payer: Self-pay

## 2019-04-03 ENCOUNTER — Ambulatory Visit
Admission: RE | Admit: 2019-04-03 | Discharge: 2019-04-03 | Disposition: A | Discharge status: Home / Self Care | Payer: BLUE CROSS/BLUE SHIELD | Source: Ambulatory Visit | Attending: Family Medicine | Admitting: Family Medicine

## 2019-04-03 ENCOUNTER — Other Ambulatory Visit: Payer: Self-pay | Admitting: Family Medicine

## 2019-04-03 DIAGNOSIS — M5431 Sciatica, right side: Secondary | ICD-10-CM

## 2019-04-03 DIAGNOSIS — U071 COVID-19: Secondary | ICD-10-CM | POA: Diagnosis not present

## 2019-04-03 DIAGNOSIS — M1611 Unilateral primary osteoarthritis, right hip: Secondary | ICD-10-CM | POA: Diagnosis not present

## 2019-04-03 DIAGNOSIS — Z20828 Contact with and (suspected) exposure to other viral communicable diseases: Secondary | ICD-10-CM | POA: Diagnosis not present

## 2019-04-10 ENCOUNTER — Telehealth: Payer: Self-pay

## 2019-04-10 DIAGNOSIS — Z20828 Contact with and (suspected) exposure to other viral communicable diseases: Secondary | ICD-10-CM | POA: Diagnosis not present

## 2019-04-10 DIAGNOSIS — M5431 Sciatica, right side: Secondary | ICD-10-CM

## 2019-04-10 DIAGNOSIS — U071 COVID-19: Secondary | ICD-10-CM | POA: Diagnosis not present

## 2019-04-10 NOTE — Telephone Encounter (Signed)
Patient calls nurse line stating she is too drowsy with Gabapentin. Patient states she takes 300mg  at bed time, and feels she is drowsy most of the morning. Patient doesn't know if something else should be called in, or if she can half the dose? Please advise.

## 2019-04-11 MED ORDER — GABAPENTIN 100 MG PO CAPS: 100.0000 mg | ORAL_CAPSULE | Freq: Three times a day (TID) | ORAL | 3 refills | Status: DC

## 2019-04-11 NOTE — Telephone Encounter (Signed)
Called and discussed multiple options for her pain over the phone.  Patient reports that the 300 made her too drowsy.  She would like to have a new prescription of the gabapentin 100 mg to trial for her pain at night.  If this is too expensive, she will have to current pills that she has to see if that works.  She is currently taking Advil dual action which includes 250 mg acetaminophen with 125 mg of ibuprofen.  Patient has taken 2 to 3 pills every 8 hours in order to help with her pain.  She is concerned that she may be taking too much.  Counseled pain that the goal is for [place] to be able to limit the amount of this that she is having to take but that she is allowed to take the amount that she has currently been taking.  She was also advised to try to do the exercises that she was previously shown in physical therapy.  Offered physical therapy is another option for her however her co-pay is $75 per visit and going twice per week it became not financially feasible for her.  Patient is to call me if she is still drowsy not having pain relief from the gabapentin and we may discuss her going to sports medicine for instructions on how to prevent her hip and low back pain from getting worse as well as discussing possible steroid injections.  Patient appreciative of talk and voiced understanding.  Cheryl Kendarious Gudino, DO PGY-3, [facility]

## 2019-04-17 DIAGNOSIS — Z20828 Contact with and (suspected) exposure to other viral communicable diseases: Secondary | ICD-10-CM | POA: Diagnosis not present

## 2019-04-17 DIAGNOSIS — U071 COVID-19: Secondary | ICD-10-CM | POA: Diagnosis not present

## 2019-04-17 NOTE — Telephone Encounter (Signed)
Pt calls back.  The gabapentin 100mg  three times a day is not helping "at all and just makes me sleepy"  Pt states that the only thing that helps is 4 advil three times daily.  She would like for Dr. Enid Derry to call her again to discuss next steps.  Christen Bame, CMA

## 2019-04-18 NOTE — Telephone Encounter (Signed)
She was only to take it at night, not three times per day to decrease the sleepiness.  If she would like, please schedule her for a virtual visit to discuss other options that may help with her pain, but not make her so sleepy. I can also go ahead and place a referral for her to be seen by sports medicine as we discussed in the office.   It is ok for her to take the amount of advil dual action she is taking.

## 2019-04-18 NOTE — Telephone Encounter (Signed)
Patient calls again. I scheduled her a virtual visit with thompson to discuss her wanting a steroid. Patient not want to wait until PCP next availably, which was end of October.

## 2019-04-19 NOTE — Telephone Encounter (Signed)
Referral placed to sports medicine. Did discuss with her during last visit that steroids are not indicated for her pain and with her diabetes could raise her sugars. She may benefit from an injection, but will defer to Dr. Grandville Silos, and will forward this to him. Thanks!

## 2019-04-19 NOTE — Addendum Note (Signed)
Addended by: Jayron Maqueda, [place] J on: 04/19/2019 12:15 PM   Modules accepted: Orders

## 2019-04-22 ENCOUNTER — Telehealth (INDEPENDENT_AMBULATORY_CARE_PROVIDER_SITE_OTHER): Payer: BLUE CROSS/BLUE SHIELD | Admitting: Family Medicine

## 2019-04-22 ENCOUNTER — Telehealth: Payer: BLUE CROSS/BLUE SHIELD | Admitting: Family Medicine

## 2019-04-22 ENCOUNTER — Other Ambulatory Visit: Payer: Self-pay

## 2019-04-22 DIAGNOSIS — M5431 Sciatica, right side: Secondary | ICD-10-CM | POA: Diagnosis not present

## 2019-04-22 MED ORDER — PREDNISONE 20 MG PO TABS: 20.0000 mg | ORAL_TABLET | Freq: Every day | ORAL | 0 refills | Status: DC

## 2019-04-22 NOTE — Progress Notes (Signed)
[facility] Visit  Patient consented to have virtual visit. Method of visit: Telephone  Encounter participants: Patient: Cheryl Carroll - located at home Provider: Chrisandra Netters - located at office Others (if applicable): n/a  Chief Complaint: sciatica  HPI:  Patient seen via telephone visit for follow up of her sciatica.  Has longstanding history of R sided sciatica pain. Has previously tried multiple medications, with pain still worsening. She is requesting a round of prednisone, as she has several friends who have taken it for similar complaints and had great relief. She has previously tried ibuprofen, heating pad, tylenol, gabapentin, physical therapy without improvement. Her prior PCP (Dr. Redmond School) attempted to get an  MRI in May but it appears her insurance denied this. She recently established care with [place] at the [facility] in late September, saw Dr. Enid Derry. Pain is located in R buttock and travels down leg to her foot. Worse with walking. No bowel/bladder issues. No fevers. No weakness in legs. Gabapentin made her very sleepy and did not help with pain.   ROS: per HPI  Pertinent PMHx: history of sarcoidosis, hyperlipidemia, hypertension, diabetes   Exam:  Respiratory: Patient speaking normally in full sentences throughout the encounter, without any respiratory distress evident.   Assessment/Plan:  Sciatica of right side Could not tolerate gabapentin due to fatigue, and did not note much improvement with it anyways. Patient very strongly desiring round of prednisone to see if it helps. May have some placebo effect given how much she desires it. Recent A1c at goal. Discussed risk of raising blood sugar and that this is not a long term medication. Will rx medium dose prednisone 20mg  daily for 5 days. Patient to monitor blood sugar at home. At follow up consider trial of duloxetine. Also ordered MRI to further assess anatomy.  Patient states she WOULD be willing to have a surgery if it would help her pain. Patient agreeable and appreciative.    Time spent during visit with patient: 13 minutes

## 2019-04-22 NOTE — Assessment & Plan Note (Signed)
Could not tolerate gabapentin due to fatigue, and did not note much improvement with it anyways. Patient very strongly desiring round of prednisone to see if it helps. May have some placebo effect given how much she desires it. Recent A1c at goal. Discussed risk of raising blood sugar and that this is not a long term medication. Will rx medium dose prednisone 20mg  daily for 5 days. Patient to monitor blood sugar at home. At follow up consider trial of duloxetine. Also ordered MRI to further assess anatomy. Patient states she WOULD be willing to have a surgery if it would help her pain. Patient agreeable and appreciative.

## 2019-04-24 DIAGNOSIS — U071 COVID-19: Secondary | ICD-10-CM | POA: Diagnosis not present

## 2019-04-24 DIAGNOSIS — Z20828 Contact with and (suspected) exposure to other viral communicable diseases: Secondary | ICD-10-CM | POA: Diagnosis not present

## 2019-04-25 NOTE — Telephone Encounter (Signed)
Patient calls again. I informed her of referral to sports medicine.

## 2019-04-29 ENCOUNTER — Telehealth: Payer: Self-pay | Admitting: *Deleted

## 2019-04-29 ENCOUNTER — Other Ambulatory Visit: Payer: Self-pay

## 2019-04-29 ENCOUNTER — Ambulatory Visit: Payer: BLUE CROSS/BLUE SHIELD | Admitting: Family Medicine

## 2019-04-29 VITALS — BP 122/78 | Ht 62.0 in | Wt 168.0 lb

## 2019-04-29 DIAGNOSIS — M5431 Sciatica, right side: Secondary | ICD-10-CM | POA: Diagnosis not present

## 2019-04-29 MED ORDER — MELOXICAM 15 MG PO TABS: 15.0000 mg | ORAL_TABLET | Freq: Every day | ORAL | 1 refills | Status: DC

## 2019-04-29 NOTE — Telephone Encounter (Signed)
-----   Message from [name], MD sent at 04/22/2019  2:41 PM EDT ----- Please schedule MRI and contact patient with appointment Thanks [name], MD

## 2019-04-29 NOTE — Telephone Encounter (Signed)
LMOVM for pt to call us back. Did schedule her MRI for 10/30 @ 3pm must arrive @ 2:30 at [facility] 1st floor radiology. Deseree Kennon Holter, CMA

## 2019-04-29 NOTE — Progress Notes (Signed)
     Subjective: HPI: Cheryl Carroll is a 53 y.o. presenting to clinic today to discuss the following:  Right Sided Sciatica Patient is a 53y/o female with PMH of right hip OA and sciatica presenting today for further evaluation of her right sided hip and low back pain. She states she has struggled with this for the past year but lately her pain has just gotten "worse and worse" for the past 2 months. Her pain is helped by Advil which she takes 4, four times per day. Tylenol also helps some. The pain is achy and numbness that starts in her butt and radiates down the right side of the back of her leg all the way to her foot with associated numbness and tingling. Patient states she went to formal PT at request of her PCP for 2 months with no improvement in her symptoms. No bowel/bladder dysfunction.  ROS noted in HPI.   PMH, Meds, All, SH, FH reviewed and updated.  Social History   Tobacco Use  Smoking Status Never Smoker  Smokeless Tobacco Never Used    Objective: BP 122/78   Ht 5\' 2"  (1.575 m)   Wt 168 lb (76.2 kg)   BMI 30.73 kg/m  Vitals and nursing notes reviewed  Physical Exam  Hip, Right:  No obvious rash, erythema, ecchymosis, or edema, instability. ROM full in all directions (IR: 80/ ER: 80/Flex: 120/Ext: 100/Abd: 45/Add: 45); Strength 5/5 in IR/ER/Flex/Ext/Abd/Add. Pelvic alignment unremarkable to inspection and palpation. Standing hip rotation and gait without trendelenburg / unsteadiness. Greater trochanter without tenderness to palpation. Tenderness over piriformis. No SI joint tenderness and normal minimal SI movement. Negative FADER and FABIR.   Hip, Left: No obvious rash, erythema, ecchymosis, or edema, instability. ROM full in all directions (IR: 80/ ER: 80/Flex: 120/Ext: 100/Abd: 45/Add: 45); Strength 5/5 in IR/ER/Flex/Ext/Abd/Add. Pelvic alignment unremarkable to inspection and palpation. Standing hip rotation and gait without trendelenburg /  unsteadiness. Greater trochanter without tenderness to palpation. No tenderness over piriformis. No SI joint tenderness and normal minimal SI movement. Negative FADER and FABIR.   Assessment/Plan:  Sciatica of right side Given symptoms and overall exam her pain is consistent with a mild right hip OA and right sided sciatica. Given she has had a poor response to formal physical therapy and unwilling to continue to go will continue to offer conservative treatments. I did inform patient as OA gets worse she could have replacement but that would be far in the future. - Cont Tylenol, heat as needed - Meloxicam 15mg  once daily with food for two weeks as this will help with both OA of right hip and sciatica pain - F/u with [place] after MRI results to discuss further treatment options. Patient given her MRI location, time, and date   PATIENT EDUCATION PROVIDED: See AVS    Diagnosis and plan along with any newly prescribed medication(s) were discussed in detail with this patient today. The patient verbalized understanding and agreed with the plan. Patient advised if symptoms worsen return to clinic or ER.   Harolyn Rutherford, DO 04/29/2019, 9:51 AM PGY-3 [place]

## 2019-04-29 NOTE — Assessment & Plan Note (Addendum)
Given symptoms and overall exam her pain is consistent with a mild right hip OA and right sided sciatica. Given she has had a poor response to formal physical therapy and unwilling to continue to go will continue to offer conservative treatments. I did inform patient as OA gets worse she could have replacement but that would be far in the future. - Cont Tylenol, heat as needed - Meloxicam 15mg  once daily with food for two weeks as this will help with both OA of right hip and sciatica pain - F/u with [place] after MRI results to discuss further treatment options. Patient given her MRI location, time, and date

## 2019-04-29 NOTE — Patient Instructions (Signed)
Sciatica ° °Sciatica is pain, weakness, tingling, or loss of feeling (numbness) along the sciatic nerve. The sciatic nerve starts in the lower back and goes down the back of each leg. Sciatica usually goes away on its own or with treatment. Sometimes, sciatica may come back (recur). °What are the causes? °This condition happens when the sciatic nerve is pinched or has pressure put on it. This may be the result of: °· A disk in between the bones of the spine bulging out too far (herniated disk). °· Changes in the spinal disks that occur with aging. °· A condition that affects a muscle in the butt. °· Extra bone growth near the sciatic nerve. °· A break (fracture) of the area between your hip bones (pelvis). °· Pregnancy. °· Tumor. This is rare. °What increases the risk? °You are more likely to develop this condition if you: °· Play sports that put pressure or stress on the spine. °· Have poor strength and ease of movement (flexibility). °· Have had a back injury in the past. °· Have had back surgery. °· Sit for long periods of time. °· Do activities that involve bending or lifting over and over again. °· Are very overweight (obese). °What are the signs or symptoms? °Symptoms can vary from mild to very bad. They may include: °· Any of these problems in the lower back, leg, hip, or butt: °? Mild tingling, loss of feeling, or dull aches. °? Burning sensations. °? Sharp pains. °· Loss of feeling in the back of the calf or the sole of the foot. °· Leg weakness. °· Very bad back pain that makes it hard to move. °These symptoms may get worse when you cough, sneeze, or laugh. They may also get worse when you sit or stand for long periods of time. °How is this treated? °This condition often gets better without any treatment. However, treatment may include: °· Changing or cutting back on physical activity when you have pain. °· Doing exercises and stretching. °· Putting ice or heat on the affected area. °· Medicines that  help: °? To relieve pain and swelling. °? To relax your muscles. °· Shots (injections) of medicines that help to relieve pain, irritation, and swelling. °· Surgery. °Follow these instructions at home: °Medicines °· Take over-the-counter and prescription medicines only as told by your doctor. °· Ask your doctor if the medicine prescribed to you: °? Requires you to avoid driving or using heavy machinery. °? Can cause trouble pooping (constipation). You may need to take these steps to prevent or treat trouble pooping: °§ Drink enough fluids to keep your pee (urine) pale yellow. °§ Take over-the-counter or prescription medicines. °§ Eat foods that are high in fiber. These include beans, whole grains, and fresh fruits and vegetables. °§ Limit foods that are high in fat and sugar. These include fried or sweet foods. °Managing pain ° °  ° °· If told, put ice on the affected area. °? Put ice in a plastic bag. °? Place a towel between your skin and the bag. °? Leave the ice on for 20 minutes, 2-3 times a day. °· If told, put heat on the affected area. Use the heat source that your doctor tells you to use, such as a moist heat pack or a heating pad. °? Place a towel between your skin and the heat source. °? Leave the heat on for 20-30 minutes. °? Remove the heat if your skin turns bright red. This is very important if you are   unable to feel pain, heat, or cold. You may have a greater risk of getting burned. °Activity ° °· Return to your normal activities as told by your doctor. Ask your doctor what activities are safe for you. °· Avoid activities that make your symptoms worse. °· Take short rests during the day. °? When you rest for a long time, do some physical activity or stretching between periods of rest. °? Avoid sitting for a long time without moving. Get up and move around at least one time each hour. °· Exercise and stretch regularly, as told by your doctor. °· Do not lift anything that is heavier than 10 lb (4.5 kg)  while you have symptoms of sciatica. °? Avoid lifting heavy things even when you do not have symptoms. °? Avoid lifting heavy things over and over. °· When you lift objects, always lift in a way that is safe for your body. To do this, you should: °? Bend your knees. °? Keep the object close to your body. °? Avoid twisting. °General instructions °· Stay at a healthy weight. °· Wear comfortable shoes that support your feet. Avoid wearing high heels. °· Avoid sleeping on a mattress that is too soft or too hard. You might have less pain if you sleep on a mattress that is firm enough to support your back. °· Keep all follow-up visits as told by your doctor. This is important. °Contact a doctor if: °· You have pain that: °? Wakes you up when you are sleeping. °? Gets worse when you lie down. °? Is worse than the pain you have had in the past. °? Lasts longer than 4 weeks. °· You lose weight without trying. °Get help right away if: °· You cannot control when you pee (urinate) or poop (have a bowel movement). °· You have weakness in any of these areas and it gets worse: °? Lower back. °? The area between your hip bones. °? Butt. °? Legs. °· You have redness or swelling of your back. °· You have a burning feeling when you pee. °Summary °· Sciatica is pain, weakness, tingling, or loss of feeling (numbness) along the sciatic nerve. °· This condition happens when the sciatic nerve is pinched or has pressure put on it. °· Sciatica can cause pain, tingling, or loss of feeling (numbness) in the lower back, legs, hips, and butt. °· Treatment often includes rest, exercise, medicines, and putting ice or heat on the affected area. °This information is not intended to replace advice given to you by your health care provider. Make sure you discuss any questions you have with your health care provider. °Document Released: 03/29/2008 Document Revised: 07/09/2018 Document Reviewed: 07/09/2018 °Elsevier Patient Education © 2020 Elsevier  Inc. ° °

## 2019-04-30 ENCOUNTER — Encounter: Payer: Self-pay | Admitting: Family Medicine

## 2019-05-01 DIAGNOSIS — Z20828 Contact with and (suspected) exposure to other viral communicable diseases: Secondary | ICD-10-CM | POA: Diagnosis not present

## 2019-05-01 DIAGNOSIS — U071 COVID-19: Secondary | ICD-10-CM | POA: Diagnosis not present

## 2019-05-03 ENCOUNTER — Other Ambulatory Visit: Payer: Self-pay

## 2019-05-03 ENCOUNTER — Ambulatory Visit (HOSPITAL_COMMUNITY)
Admission: RE | Admit: 2019-05-03 | Discharge: 2019-05-03 | Disposition: A | Discharge status: Home / Self Care | Payer: BLUE CROSS/BLUE SHIELD | Source: Ambulatory Visit | Attending: Family Medicine | Admitting: Family Medicine

## 2019-05-03 DIAGNOSIS — M5126 Other intervertebral disc displacement, lumbar region: Secondary | ICD-10-CM | POA: Diagnosis not present

## 2019-05-03 DIAGNOSIS — M5431 Sciatica, right side: Secondary | ICD-10-CM | POA: Insufficient documentation

## 2019-05-06 ENCOUNTER — Telehealth: Payer: Self-pay | Admitting: Family Medicine

## 2019-05-06 DIAGNOSIS — M5126 Other intervertebral disc displacement, lumbar region: Secondary | ICD-10-CM

## 2019-05-06 NOTE — Telephone Encounter (Signed)
Called patient to discuss MRI results. 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 Recommend she be seen by neurosurgery to discuss options either of epidural steroid injection or surgery (spoke with sports medicine Dr. Barbaraann Barthel who agrees w/ referral). Referral placed. Patient appreciative.  [name], MD

## 2019-05-09 DIAGNOSIS — U071 COVID-19: Secondary | ICD-10-CM | POA: Diagnosis not present

## 2019-05-15 DIAGNOSIS — Z20828 Contact with and (suspected) exposure to other viral communicable diseases: Secondary | ICD-10-CM | POA: Diagnosis not present

## 2019-05-15 DIAGNOSIS — U071 COVID-19: Secondary | ICD-10-CM | POA: Diagnosis not present

## 2019-05-22 DIAGNOSIS — Z20828 Contact with and (suspected) exposure to other viral communicable diseases: Secondary | ICD-10-CM | POA: Diagnosis not present

## 2019-05-29 DIAGNOSIS — U071 COVID-19: Secondary | ICD-10-CM | POA: Diagnosis not present

## 2019-05-29 DIAGNOSIS — Z20828 Contact with and (suspected) exposure to other viral communicable diseases: Secondary | ICD-10-CM | POA: Diagnosis not present

## 2019-06-05 DIAGNOSIS — Z20828 Contact with and (suspected) exposure to other viral communicable diseases: Secondary | ICD-10-CM | POA: Diagnosis not present

## 2019-06-10 DIAGNOSIS — Z20828 Contact with and (suspected) exposure to other viral communicable diseases: Secondary | ICD-10-CM | POA: Diagnosis not present

## 2019-06-11 DIAGNOSIS — E119 Type 2 diabetes mellitus without complications: Secondary | ICD-10-CM | POA: Diagnosis not present

## 2019-06-11 LAB — HM DIABETES EYE EXAM

## 2019-06-12 ENCOUNTER — Other Ambulatory Visit: Payer: Self-pay | Admitting: Family Medicine

## 2019-06-17 DIAGNOSIS — Z20828 Contact with and (suspected) exposure to other viral communicable diseases: Secondary | ICD-10-CM | POA: Diagnosis not present

## 2019-06-17 DIAGNOSIS — U071 COVID-19: Secondary | ICD-10-CM | POA: Diagnosis not present

## 2019-06-18 ENCOUNTER — Other Ambulatory Visit: Payer: Self-pay

## 2019-06-18 MED ORDER — MELOXICAM 15 MG PO TABS: 15.0000 mg | ORAL_TABLET | Freq: Every day | ORAL | 0 refills | Status: DC

## 2019-06-18 NOTE — Progress Notes (Signed)
Pt is asking for a refill on Meloxicam. She is still having pain and is asking if we can increase the dosage.   I let pt know that there is not a higher dose of Meloxicam. Will send in one more refill to her pharmacy. Also told her to see the neurosurgeon for f/u. She lost their number so I provided her with that as well. Pt agrees with the plan.

## 2019-06-24 DIAGNOSIS — Z20828 Contact with and (suspected) exposure to other viral communicable diseases: Secondary | ICD-10-CM | POA: Diagnosis not present

## 2019-07-01 DIAGNOSIS — U071 COVID-19: Secondary | ICD-10-CM | POA: Diagnosis not present

## 2019-07-08 DIAGNOSIS — Z20828 Contact with and (suspected) exposure to other viral communicable diseases: Secondary | ICD-10-CM | POA: Diagnosis not present

## 2019-07-08 DIAGNOSIS — U071 COVID-19: Secondary | ICD-10-CM | POA: Diagnosis not present

## 2019-07-10 ENCOUNTER — Telehealth: Payer: Self-pay

## 2019-07-10 MED ORDER — MELOXICAM 15 MG PO TABS: 15.0000 mg | ORAL_TABLET | Freq: Every day | ORAL | 0 refills | Status: DC

## 2019-07-10 NOTE — Telephone Encounter (Signed)
Will send refill to her pharmacy. In regards to the neuro referral, her PCP was the one who sent that initially. Pt instructed to f/u with her PCP to get referred to another neurosurgeon that accepts her insurance.

## 2019-07-11 DIAGNOSIS — Z20828 Contact with and (suspected) exposure to other viral communicable diseases: Secondary | ICD-10-CM | POA: Diagnosis not present

## 2019-07-11 DIAGNOSIS — U071 COVID-19: Secondary | ICD-10-CM | POA: Diagnosis not present

## 2019-07-15 DIAGNOSIS — Z20828 Contact with and (suspected) exposure to other viral communicable diseases: Secondary | ICD-10-CM | POA: Diagnosis not present

## 2019-07-18 DIAGNOSIS — Z20828 Contact with and (suspected) exposure to other viral communicable diseases: Secondary | ICD-10-CM | POA: Diagnosis not present

## 2019-07-18 DIAGNOSIS — U071 COVID-19: Secondary | ICD-10-CM | POA: Diagnosis not present

## 2019-07-22 ENCOUNTER — Telehealth: Payer: Self-pay | Admitting: *Deleted

## 2019-07-22 NOTE — Telephone Encounter (Signed)
Patient states that she was able to speak with [facility] but they don't take her insurance.  Patient would like [place] to send her to another neurosurgeon.  Cheryl Carroll,CMA

## 2019-07-23 ENCOUNTER — Other Ambulatory Visit: Payer: Self-pay | Admitting: Family Medicine

## 2019-07-23 DIAGNOSIS — Z20828 Contact with and (suspected) exposure to other viral communicable diseases: Secondary | ICD-10-CM | POA: Diagnosis not present

## 2019-07-25 DIAGNOSIS — U071 COVID-19: Secondary | ICD-10-CM | POA: Diagnosis not present

## 2019-07-25 DIAGNOSIS — Z20828 Contact with and (suspected) exposure to other viral communicable diseases: Secondary | ICD-10-CM | POA: Diagnosis not present

## 2019-07-29 DIAGNOSIS — Z20828 Contact with and (suspected) exposure to other viral communicable diseases: Secondary | ICD-10-CM | POA: Diagnosis not present

## 2019-07-29 DIAGNOSIS — U071 COVID-19: Secondary | ICD-10-CM | POA: Diagnosis not present

## 2019-07-31 NOTE — Telephone Encounter (Signed)
Appears that a new referral was sent to [facility], [address] [place], [phone]

## 2019-07-31 NOTE — Telephone Encounter (Signed)
Will check with referral coordinator on status. Cheryl Carroll,CMA  

## 2019-07-31 NOTE — Telephone Encounter (Signed)
Patient calling again to check the status of new referral placement.

## 2019-08-01 DIAGNOSIS — U071 COVID-19: Secondary | ICD-10-CM | POA: Diagnosis not present

## 2019-08-05 DIAGNOSIS — U071 COVID-19: Secondary | ICD-10-CM | POA: Diagnosis not present

## 2019-08-05 DIAGNOSIS — Z20828 Contact with and (suspected) exposure to other viral communicable diseases: Secondary | ICD-10-CM | POA: Diagnosis not present

## 2019-08-08 DIAGNOSIS — U071 COVID-19: Secondary | ICD-10-CM | POA: Diagnosis not present

## 2019-08-08 DIAGNOSIS — Z20828 Contact with and (suspected) exposure to other viral communicable diseases: Secondary | ICD-10-CM | POA: Diagnosis not present

## 2019-08-09 ENCOUNTER — Other Ambulatory Visit: Payer: Self-pay | Admitting: *Deleted

## 2019-08-09 MED ORDER — MELOXICAM 15 MG PO TABS: 15.0000 mg | ORAL_TABLET | Freq: Every day | ORAL | 0 refills | Status: DC

## 2019-08-12 DIAGNOSIS — U071 COVID-19: Secondary | ICD-10-CM | POA: Diagnosis not present

## 2019-08-12 DIAGNOSIS — Z20828 Contact with and (suspected) exposure to other viral communicable diseases: Secondary | ICD-10-CM | POA: Diagnosis not present

## 2019-08-15 ENCOUNTER — Telehealth: Payer: Self-pay

## 2019-08-15 DIAGNOSIS — U071 COVID-19: Secondary | ICD-10-CM | POA: Diagnosis not present

## 2019-08-15 DIAGNOSIS — Z20828 Contact with and (suspected) exposure to other viral communicable diseases: Secondary | ICD-10-CM | POA: Diagnosis not present

## 2019-08-15 NOTE — Telephone Encounter (Signed)
Please call patient to inform her that a new referral was placed on 08/09/2019 to:  [facility] [address] [place] [address] [phone]  Patient may call the above number in order to schedule her appointment.

## 2019-08-15 NOTE — Telephone Encounter (Signed)
Patient informed of number and will call their office to check on referral status. Greenlee Ancheta,CMA

## 2019-08-21 ENCOUNTER — Other Ambulatory Visit: Payer: Self-pay | Admitting: Family Medicine

## 2019-08-21 DIAGNOSIS — Z20828 Contact with and (suspected) exposure to other viral communicable diseases: Secondary | ICD-10-CM | POA: Diagnosis not present

## 2019-08-21 DIAGNOSIS — U071 COVID-19: Secondary | ICD-10-CM | POA: Diagnosis not present

## 2019-08-26 DIAGNOSIS — U071 COVID-19: Secondary | ICD-10-CM | POA: Diagnosis not present

## 2019-08-26 DIAGNOSIS — Z20828 Contact with and (suspected) exposure to other viral communicable diseases: Secondary | ICD-10-CM | POA: Diagnosis not present

## 2019-08-30 DIAGNOSIS — Z20828 Contact with and (suspected) exposure to other viral communicable diseases: Secondary | ICD-10-CM | POA: Diagnosis not present

## 2019-09-02 DIAGNOSIS — Z20828 Contact with and (suspected) exposure to other viral communicable diseases: Secondary | ICD-10-CM | POA: Diagnosis not present

## 2019-09-02 DIAGNOSIS — U071 COVID-19: Secondary | ICD-10-CM | POA: Diagnosis not present

## 2019-09-06 ENCOUNTER — Telehealth: Payer: Self-pay | Admitting: Family Medicine

## 2019-09-06 NOTE — Telephone Encounter (Signed)
Pt needs a referral for pain mangement for her nerve pain jw

## 2019-09-06 NOTE — Telephone Encounter (Signed)
Routed to PCP 

## 2019-09-09 DIAGNOSIS — U071 COVID-19: Secondary | ICD-10-CM | POA: Diagnosis not present

## 2019-09-09 DIAGNOSIS — Z20828 Contact with and (suspected) exposure to other viral communicable diseases: Secondary | ICD-10-CM | POA: Diagnosis not present

## 2019-09-13 NOTE — Telephone Encounter (Signed)
Patient to follow up with neurosurgery who may provide pain relief options for her nerve pain, if not, I can place referral, but she should likely see them first.

## 2019-09-13 NOTE — Telephone Encounter (Signed)
Spoke with patient and agreed with plan.  She has an appointment to see them already on 09-28-19 and will let us know if she needs the pain management referral or not after .  Jazmin Hartsell,CMA

## 2019-09-17 ENCOUNTER — Other Ambulatory Visit: Payer: Self-pay | Admitting: Family Medicine

## 2019-09-17 DIAGNOSIS — Z20828 Contact with and (suspected) exposure to other viral communicable diseases: Secondary | ICD-10-CM | POA: Diagnosis not present

## 2019-09-17 DIAGNOSIS — U071 COVID-19: Secondary | ICD-10-CM | POA: Diagnosis not present

## 2019-09-23 ENCOUNTER — Other Ambulatory Visit: Payer: Self-pay

## 2019-09-23 DIAGNOSIS — M5136 Other intervertebral disc degeneration, lumbar region: Secondary | ICD-10-CM | POA: Diagnosis not present

## 2019-09-23 DIAGNOSIS — M5416 Radiculopathy, lumbar region: Secondary | ICD-10-CM | POA: Diagnosis not present

## 2019-09-23 DIAGNOSIS — M48061 Spinal stenosis, lumbar region without neurogenic claudication: Secondary | ICD-10-CM | POA: Diagnosis not present

## 2019-09-23 DIAGNOSIS — M47816 Spondylosis without myelopathy or radiculopathy, lumbar region: Secondary | ICD-10-CM | POA: Diagnosis not present

## 2019-09-23 MED ORDER — MELOXICAM 15 MG PO TABS: 15.0000 mg | ORAL_TABLET | Freq: Every day | ORAL | 0 refills | Status: DC

## 2019-09-24 DIAGNOSIS — U071 COVID-19: Secondary | ICD-10-CM | POA: Diagnosis not present

## 2019-09-24 DIAGNOSIS — Z20828 Contact with and (suspected) exposure to other viral communicable diseases: Secondary | ICD-10-CM | POA: Diagnosis not present

## 2019-10-13 ENCOUNTER — Other Ambulatory Visit: Payer: Self-pay | Admitting: Family Medicine

## 2019-10-17 ENCOUNTER — Other Ambulatory Visit: Payer: Self-pay

## 2019-10-17 MED ORDER — OLMESARTAN MEDOXOMIL-HCTZ 40-12.5 MG PO TABS: 1.0000 | ORAL_TABLET | Freq: Every day | ORAL | 3 refills | Status: DC

## 2019-10-17 NOTE — Telephone Encounter (Signed)
Patient has not been dismissed.  Appears that prescription was sent to previous doctor.  I did not refuse prescription.  Patient to be notified and updated.  Will refill this prescription.

## 2019-10-17 NOTE — Telephone Encounter (Signed)
Patient calls nurse line wondering why her blood pressure medication was denied. Looks like she was dismissed from practice? Patient seemed confused about this. Will forward to last PCP.

## 2019-10-20 ENCOUNTER — Other Ambulatory Visit: Payer: Self-pay | Admitting: Family Medicine

## 2019-10-24 ENCOUNTER — Other Ambulatory Visit: Payer: Self-pay

## 2019-10-24 ENCOUNTER — Ambulatory Visit (INDEPENDENT_AMBULATORY_CARE_PROVIDER_SITE_OTHER): Payer: BC Managed Care – PPO | Admitting: Family Medicine

## 2019-10-24 ENCOUNTER — Encounter: Payer: Self-pay | Admitting: Family Medicine

## 2019-10-24 VITALS — BP 130/60 | HR 93 | Ht 62.0 in | Wt 165.4 lb

## 2019-10-24 DIAGNOSIS — E119 Type 2 diabetes mellitus without complications: Secondary | ICD-10-CM

## 2019-10-24 DIAGNOSIS — E1169 Type 2 diabetes mellitus with other specified complication: Secondary | ICD-10-CM | POA: Diagnosis not present

## 2019-10-24 DIAGNOSIS — Z1231 Encounter for screening mammogram for malignant neoplasm of breast: Secondary | ICD-10-CM

## 2019-10-24 DIAGNOSIS — Z1211 Encounter for screening for malignant neoplasm of colon: Secondary | ICD-10-CM | POA: Diagnosis not present

## 2019-10-24 DIAGNOSIS — I10 Essential (primary) hypertension: Secondary | ICD-10-CM

## 2019-10-24 DIAGNOSIS — E1159 Type 2 diabetes mellitus with other circulatory complications: Secondary | ICD-10-CM

## 2019-10-24 DIAGNOSIS — E785 Hyperlipidemia, unspecified: Secondary | ICD-10-CM

## 2019-10-24 DIAGNOSIS — I152 Hypertension secondary to endocrine disorders: Secondary | ICD-10-CM

## 2019-10-24 DIAGNOSIS — M5431 Sciatica, right side: Secondary | ICD-10-CM

## 2019-10-24 LAB — POCT GLYCOSYLATED HEMOGLOBIN (HGB A1C): HbA1c, POC (controlled diabetic range): 8.2 % — AB (ref 0.0–7.0)

## 2019-10-24 MED ORDER — MELOXICAM 7.5 MG PO TABS: 7.5000 mg | ORAL_TABLET | Freq: Every day | ORAL | 0 refills | Status: DC

## 2019-10-24 MED ORDER — FARXIGA 5 MG PO TABS: 5.0000 mg | ORAL_TABLET | Freq: Every day | ORAL | 1 refills | Status: DC

## 2019-10-24 MED ORDER — METFORMIN HCL 500 MG PO TABS: ORAL_TABLET | ORAL | 1 refills | Status: DC

## 2019-10-24 NOTE — Progress Notes (Signed)
SUBJECTIVE:   CHIEF COMPLAINT / HPI:   Diabetes: Medications: Metformin (previously unable to tolerate so recently stopped) Compliance: Recently stopped Metformin as above Hypoglycemic symptoms: no On Aspirin, and on statin Last eye exam: recently Last foot exam:  Performed today ROS: denies dizziness, diaphoresis, LOC, polyuria, polydipsia  Hypertension: - Medications: Olmesartan 40-hydrochlorothiazide 12.5 - Compliance: yes - Checking BP at home: no - Denies any SOB, CP, vision changes, LE edema, medication SEs, or symptoms of hypotension  Low Back Pain: Patient reports that she is still can having continued back pain.  She has been following with neurology and they have recommended that she receive injections however patient is hesitant to start this.  She reports that the neurologist also recommended to her that she consider surgery.  Patient would not like to have surgery if at all possible.  She states that her back pain is well controlled on meloxicam.  She reports that the neurologist would not continue to refill this until she has her kidneys checked as she is not supposed to be on this long-term.  Patient reports that she is aware of the risks of being on this medication long-term including the risk of developing stomach ulcers, kidney issues and increased risk for cardiovascular events.  Health maintenance Patient again encouraged to have mammogram and colonoscopy done.  Patient is also due for Pap smear.  PERTINENT  PMH / PSH: HTN, HLD, T2DM, sciatica on the right, h/o sarcoidosis  OBJECTIVE:  BP 130/60   Pulse 93   Ht 5\' 2"  (1.575 m)   Wt 165 lb 6 oz (75 kg)   SpO2 98%   BMI 30.25 kg/m   General: NAD, pleasant Neck: Supple, no LAD Cardiovascular: RRR, no m/r/g, no LE edema Respiratory: CTA BL, normal work of breathing Neuro: CN II-XII grossly intact Psych: AOx3, appropriate affect  Diabetic Foot Exam - Simple   Simple Foot Form Diabetic Foot exam was  performed with the following findings: Yes 10/24/2019  2:42 PM  Visual Inspection No deformities, no ulcerations, no other skin breakdown bilaterally: Yes See comments: Yes Sensation Testing Intact to touch and monofilament testing bilaterally: Yes Pulse Check Posterior Tibialis and Dorsalis pulse intact bilaterally: Yes Comments Patient with what appears to be callus or corn thickening on bilateral fifth digits.      ASSESSMENT/PLAN:   Diabetes mellitus A1c 8.3 today.  Patient had stopped Metformin due to having issues with nausea while taking it.  She reports that given the increase in A1c she is willing to restart this medication at a lower dose with high-protein meals to see if she can tolerate it better.  Patient also benefit from SGLT2 therapy.  We will start patient on Farxiga 5 mg daily.  Patient to monitor for any signs of yeast infections.  She will return on 5/5 for repeat labs and BMP.  BMP at today's visit WNL.  Hypertension associated with diabetes (HCC) BP within goal today at 130/60.  Patient to continue olmesartan-hydrochlorothiazide.  Sciatica of right side BMP WNL with normal renal function today.  Patient counseled on risk versus benefit of continuing Mobic daily including risk of ulcers, kidney injury and cardiovascular disease.  Patient voices understanding and states that this is the only medication that helps her be able to continue her daily activities.  Will reduce dose to meloxicam 7.5 mg which patient states helps her as much as the 15mg .  She is to continue going to neurology to further discuss injections versus surgery.  Hyperlipidemia associated with type 2 diabetes mellitus (HCC) LDL at 100.  Patient to increase rosuvastatin to 40 mg daily.   Health maintenance Patient scheduled for Pap on 5/5.  She is also encouraged to have mammogram and colonoscopy.  [place] Demonica Farrey, DO PGY-3, [facility]

## 2019-10-24 NOTE — Progress Notes (Deleted)
   SUBJECTIVE:   CHIEF COMPLAINT / HPI:   ***: ***  PERTINENT  PMH / PSH: ***  OBJECTIVE:  BP 130/60   Pulse 93   Ht 5\' 2"  (1.575 m)   Wt 165 lb 6 oz (75 kg)   SpO2 98%   BMI 30.25 kg/m   General: NAD, pleasant Neck: Supple, no LAD Cardiovascular: RRR, no m/r/g, no LE edema*** Respiratory: CTA BL***, normal work of breathing Gastrointestinal: soft, nontender, nondistended, normoactive BS*** Neuro: CN II-XII grossly intact Psych: AOx3, appropriate affect ***  ASSESSMENT/PLAN:   No problem-specific Assessment & Plan notes found for this encounter.    Izyk Marty, DO PGY-3, [facility]

## 2019-10-24 NOTE — Patient Instructions (Signed)
Thank you for coming to see me today. It was a pleasure! Today we talked about:   For the areas on your feet, you may use a corn cushion in order to help it to heal better.  I have placed a referral to podiatry where they can shave off the callus in your foot in order to help with the pain.  For your back pain, please continue to follow-up with neurology regarding whether or not you should get injections.  I have refilled your meloxicam.  Please try to take 7.5 mg or 1 tablet daily to see if the lower dose will help.  As we discussed being on this medication long-term has risks including GI upset, forming of ulcers, and development of kidney disease.  We will check your kidneys today and I recommend that you take with food.  For your diabetes, I have started you on a medication called [place].  You should take this daily.  Please let me know if you develop any yeast infections.  I also recommend that you start back taking your Metformin.  You may take 1 tablet once daily with food (preferably a meal higher in protein) in order to lessen nausea or diarrhea.  Please schedule your mammogram and colonoscopy.  I have placed referrals and they will call you for scheduling.  Please schedule to have your Pap smear done here in our office as soon as you are able.  Otherwise follow-up in 3 months for diabetes and hypertension.  If you have any questions or concerns, please do not hesitate to call the office at [phone].  Take Care,   [place] Leathia Farnell, DO

## 2019-10-25 LAB — BASIC METABOLIC PANEL
BUN/Creatinine Ratio: 15 (ref 9–23)
BUN: 11 mg/dL (ref 6–24)
CO2: 27 mmol/L (ref 20–29)
Calcium: 9.9 mg/dL (ref 8.7–10.2)
Chloride: 101 mmol/L (ref 96–106)
Creatinine, Ser: 0.75 mg/dL (ref 0.57–1.00)
GFR calc Af Amer: 105 mL/min/{1.73_m2} (ref >59)
GFR calc non Af Amer: 91 mL/min/{1.73_m2} (ref >59)
Glucose: 123 mg/dL — ABNORMAL HIGH (ref 65–99)
Potassium: 3.9 mmol/L (ref 3.5–5.2)
Sodium: 141 mmol/L (ref 134–144)

## 2019-10-25 LAB — LIPID PANEL
Chol/HDL Ratio: 3.6 ratio (ref 0.0–4.4)
Cholesterol, Total: 178 mg/dL (ref 100–199)
HDL: 49 mg/dL (ref >39)
LDL Chol Calc (NIH): 100 mg/dL — ABNORMAL HIGH (ref 0–99)
Triglycerides: 165 mg/dL — ABNORMAL HIGH (ref 0–149)
VLDL Cholesterol Cal: 29 mg/dL (ref 5–40)

## 2019-10-29 MED ORDER — ROSUVASTATIN CALCIUM 40 MG PO TABS: 40.0000 mg | ORAL_TABLET | Freq: Every day | ORAL | 2 refills | Status: DC

## 2019-10-29 NOTE — Assessment & Plan Note (Signed)
A1c 8.3 today.  Patient had stopped Metformin due to having issues with nausea while taking it.  She reports that given the increase in A1c she is willing to restart this medication at a lower dose with high-protein meals to see if she can tolerate it better.  Patient also benefit from SGLT2 therapy.  We will start patient on Farxiga 5 mg daily.  Patient to monitor for any signs of yeast infections.  She will return on 5/5 for repeat labs and BMP.  BMP at today's visit WNL.

## 2019-10-29 NOTE — Assessment & Plan Note (Signed)
BMP WNL with normal renal function today.  Patient counseled on risk versus benefit of continuing Mobic daily including risk of ulcers, kidney injury and cardiovascular disease.  Patient voices understanding and states that this is the only medication that helps her be able to continue her daily activities.  Will reduce dose to meloxicam 7.5 mg which patient states helps her as much as the 15mg .  She is to continue going to neurology to further discuss injections versus surgery.

## 2019-10-29 NOTE — Assessment & Plan Note (Signed)
LDL at 100.  Patient to increase rosuvastatin to 40 mg daily.

## 2019-10-29 NOTE — Assessment & Plan Note (Signed)
BP within goal today at 130/60.  Patient to continue olmesartan-hydrochlorothiazide.

## 2019-11-06 ENCOUNTER — Ambulatory Visit: Payer: BC Managed Care – PPO

## 2019-11-12 ENCOUNTER — Encounter: Payer: Self-pay | Admitting: Family Medicine

## 2019-11-12 ENCOUNTER — Ambulatory Visit (INDEPENDENT_AMBULATORY_CARE_PROVIDER_SITE_OTHER): Payer: BC Managed Care – PPO | Admitting: Family Medicine

## 2019-11-12 ENCOUNTER — Other Ambulatory Visit: Payer: Self-pay

## 2019-11-12 VITALS — BP 120/62 | HR 74 | Ht 62.0 in | Wt 163.0 lb

## 2019-11-12 DIAGNOSIS — M722 Plantar fascial fibromatosis: Secondary | ICD-10-CM

## 2019-11-12 DIAGNOSIS — E119 Type 2 diabetes mellitus without complications: Secondary | ICD-10-CM | POA: Diagnosis not present

## 2019-11-12 NOTE — Patient Instructions (Signed)
Thank you for coming to see me today. It was a pleasure! Today we talked about:   Please try freezing a water bottle and rolling it on your foot prior to bed and 3 times per day if you are able. It will also help if you do the stretches below.   Please follow-up with our clinic in 8 weeks or sooner as needed.  If you have any questions or concerns, please do not hesitate to call the office at [phone].  Take Care,   [place] Aryan Bello, DO  Plantar Fasciitis Rehab Ask your health care provider which exercises are safe for you. Do exercises exactly as told by your health care provider and adjust them as directed. It is normal to feel mild stretching, pulling, tightness, or discomfort as you do these exercises. Stop right away if you feel sudden pain or your pain gets worse. Do not begin these exercises until told by your health care provider. Stretching and range-of-motion exercises These exercises warm up your muscles and joints and improve the movement and flexibility of your foot. These exercises also help to relieve pain. Plantar fascia stretch  1. Sit with your left / right leg crossed over your opposite knee. 2. Hold your heel with one hand with that thumb near your arch. With your other hand, hold your toes and gently pull them back toward the top of your foot. You should feel a stretch on the bottom of your toes or your foot (plantar fascia) or both. 3. Hold this stretch for__________ seconds. 4. Slowly release your toes and return to the starting position. Repeat __________ times. Complete this exercise __________ times a day. Gastrocnemius stretch, standing This exercise is also called a calf (gastroc) stretch. It stretches the muscles in the back of the upper calf. 1. Stand with your hands against a wall. 2. Extend your left / right leg behind you, and bend your front knee slightly. 3. Keeping your heels on the floor and your back knee straight, shift your weight toward the  wall. Do not arch your back. You should feel a gentle stretch in your upper left / right calf. 4. Hold this position for __________ seconds. Repeat __________ times. Complete this exercise __________ times a day. Soleus stretch, standing This exercise is also called a calf (soleus) stretch. It stretches the muscles in the back of the lower calf. 1. Stand with your hands against a wall. 2. Extend your left / right leg behind you, and bend your front knee slightly. 3. Keeping your heels on the floor, bend your back knee and shift your weight slightly over your back leg. You should feel a gentle stretch deep in your lower calf. 4. Hold this position for __________ seconds. Repeat __________ times. Complete this exercise __________ times a day. Gastroc and soleus stretch, standing step This exercise stretches the muscles in the back of the lower leg. These muscles are in the upper calf (gastrocnemius) and the lower calf (soleus). 1. Stand with the ball of your left / right foot on a step. The ball of your foot is on the walking surface, right under your toes. 2. Keep your other foot firmly on the same step. 3. Hold on to the wall or a railing for balance. 4. Slowly lift your other foot, allowing your body weight to press your left / right heel down over the edge of the step. You should feel a stretch in your left / right calf. 5. Hold this position for __________  seconds. 6. Return both feet to the step. 7. Repeat this exercise with a slight bend in your left / right knee. Repeat __________ times with your left / right knee straight and __________ times with your left / right knee bent. Complete this exercise __________ times a day. Balance exercise This exercise builds your balance and strength control of your arch to help take pressure off your plantar fascia. Single leg stand If this exercise is too easy, you can try it with your eyes closed or while standing on a pillow. 1. Without shoes,  stand near a railing or in a doorway. You may hold on to the railing or door frame as needed. 2. Stand on your left / right foot. Keep your big toe down on the floor and try to keep your arch lifted. Do not let your foot roll inward. 3. Hold this position for __________ seconds. Repeat __________ times. Complete this exercise __________ times a day. This information is not intended to replace advice given to you by your health care provider. Make sure you discuss any questions you have with your health care provider. Document Revised: 10/11/2018 Document Reviewed: 04/18/2018 Elsevier Patient Education  2020 [employer].

## 2019-11-12 NOTE — Progress Notes (Signed)
   SUBJECTIVE:   CHIEF COMPLAINT / HPI:   Patient was to come in for Pap smear but was confused coming to his appointment and states she did not want to get her Pap smear at this point and would like to wait and delay until later visit.  Patient does need repeat labs after initiating Farxiga.  Did explain to patient that she could be no charged for this visit.  Heel pain: Patient reports that her left heel has been sore for the past couple of weeks.  She reports that she is currently working 2 jobs.  She states that her pain is worse first thing in the morning when she steps out of bed.  She has not tried any therapies for this.  She does state that the meloxicam she has been taking for her back is also helping some for her heel.  PERTINENT  PMH / PSH: HTN, HLD, T2DM, sciatica on the right, h/o sarcoidosis  OBJECTIVE:  BP 120/62   Pulse 74   Ht 5\' 2"  (1.575 m)   Wt 163 lb (73.9 kg)   BMI 29.81 kg/m   General: NAD, pleasant Neck: Supple Respiratory: normal work of breathing Psych: AOx3, appropriate affect  ASSESSMENT/PLAN:   Plantar fasciitis Patient with likely plantar fasciitis given sx and exam. Will trial stretches and given handout. Patient to return if no improvement.     Udell Mazzocco, DO PGY-3, [facility]

## 2019-11-13 LAB — BASIC METABOLIC PANEL
BUN/Creatinine Ratio: 21 (ref 9–23)
BUN: 16 mg/dL (ref 6–24)
CO2: 24 mmol/L (ref 20–29)
Calcium: 9.9 mg/dL (ref 8.7–10.2)
Chloride: 100 mmol/L (ref 96–106)
Creatinine, Ser: 0.76 mg/dL (ref 0.57–1.00)
GFR calc Af Amer: 103 mL/min/{1.73_m2} (ref >59)
GFR calc non Af Amer: 89 mL/min/{1.73_m2} (ref >59)
Glucose: 172 mg/dL — ABNORMAL HIGH (ref 65–99)
Potassium: 3.7 mmol/L (ref 3.5–5.2)
Sodium: 141 mmol/L (ref 134–144)

## 2019-11-15 DIAGNOSIS — M722 Plantar fascial fibromatosis: Secondary | ICD-10-CM | POA: Insufficient documentation

## 2019-11-15 NOTE — Assessment & Plan Note (Signed)
Patient with likely plantar fasciitis given sx and exam. Will trial stretches and given handout. Patient to return if no improvement.

## 2019-11-20 ENCOUNTER — Other Ambulatory Visit: Payer: Self-pay | Admitting: Family Medicine

## 2019-12-11 ENCOUNTER — Encounter: Payer: Self-pay | Admitting: Family Medicine

## 2019-12-17 ENCOUNTER — Other Ambulatory Visit: Payer: Self-pay | Admitting: Family Medicine

## 2020-01-13 ENCOUNTER — Ambulatory Visit (INDEPENDENT_AMBULATORY_CARE_PROVIDER_SITE_OTHER): Payer: BC Managed Care – PPO

## 2020-01-13 ENCOUNTER — Other Ambulatory Visit: Payer: Self-pay

## 2020-01-13 ENCOUNTER — Encounter: Payer: Self-pay | Admitting: Podiatry

## 2020-01-13 ENCOUNTER — Ambulatory Visit: Payer: BC Managed Care – PPO | Admitting: Podiatry

## 2020-01-13 ENCOUNTER — Ambulatory Visit: Payer: BLUE CROSS/BLUE SHIELD | Admitting: Podiatry

## 2020-01-13 DIAGNOSIS — M79674 Pain in right toe(s): Secondary | ICD-10-CM | POA: Diagnosis not present

## 2020-01-13 DIAGNOSIS — M79675 Pain in left toe(s): Secondary | ICD-10-CM | POA: Diagnosis not present

## 2020-01-13 DIAGNOSIS — M722 Plantar fascial fibromatosis: Secondary | ICD-10-CM | POA: Diagnosis not present

## 2020-01-13 DIAGNOSIS — Q828 Other specified congenital malformations of skin: Secondary | ICD-10-CM

## 2020-01-13 DIAGNOSIS — E119 Type 2 diabetes mellitus without complications: Secondary | ICD-10-CM | POA: Diagnosis not present

## 2020-01-13 DIAGNOSIS — M7732 Calcaneal spur, left foot: Secondary | ICD-10-CM

## 2020-01-13 DIAGNOSIS — B351 Tinea unguium: Secondary | ICD-10-CM | POA: Diagnosis not present

## 2020-01-13 NOTE — Patient Instructions (Addendum)
Diabetes Mellitus and Foot Care Foot care is an important part of your health, especially when you have diabetes. Diabetes may cause you to have problems because of poor blood flow (circulation) to your feet and legs, which can cause your skin to:  Become thinner and drier.  Break more easily.  Heal more slowly.  Peel and crack. You may also have nerve damage (neuropathy) in your legs and feet, causing decreased feeling in them. This means that you may not notice minor injuries to your feet that could lead to more serious problems. Noticing and addressing any potential problems early is the best way to prevent future foot problems. How to care for your feet Foot hygiene  Wash your feet daily with warm water and mild soap. Do not use hot water. Then, pat your feet and the areas between your toes until they are completely dry. Do not soak your feet as this can dry your skin.  Trim your toenails straight across. Do not dig under them or around the cuticle. File the edges of your nails with an emery board or nail file.  Apply a moisturizing lotion or petroleum jelly to the skin on your feet and to dry, brittle toenails. Use lotion that does not contain alcohol and is unscented. Do not apply lotion between your toes. Shoes and socks  Wear clean socks or stockings every day. Make sure they are not too tight. Do not wear knee-high stockings since they may decrease blood flow to your legs.  Wear shoes that fit properly and have enough cushioning. Always look in your shoes before you put them on to be sure there are no objects inside.  To break in new shoes, wear them for just a few hours a day. This prevents injuries on your feet. Wounds, scrapes, corns, and calluses  Check your feet daily for blisters, cuts, bruises, sores, and redness. If you cannot see the bottom of your feet, use a mirror or ask someone for help.  Do not cut corns or calluses or try to remove them with medicine.  If you  find a minor scrape, cut, or break in the skin on your feet, keep it and the skin around it clean and dry. You may clean these areas with mild soap and water. Do not clean the area with peroxide, alcohol, or iodine.  If you have a wound, scrape, corn, or callus on your foot, look at it several times a day to make sure it is healing and not infected. Check for: ? Redness, swelling, or pain. ? Fluid or blood. ? Warmth. ? Pus or a bad smell. General instructions  Do not cross your legs. This may decrease blood flow to your feet.  Do not use heating pads or hot water bottles on your feet. They may burn your skin. If you have lost feeling in your feet or legs, you may not know this is happening until it is too late.  Protect your feet from hot and cold by wearing shoes, such as at the beach or on hot pavement.  Schedule a complete foot exam at least once a year (annually) or more often if you have foot problems. If you have foot problems, report any cuts, sores, or bruises to your health care provider immediately. Contact a health care provider if:  You have a medical condition that increases your risk of infection and you have any cuts, sores, or bruises on your feet.  You have an injury that is not   healing.  You have redness on your legs or feet.  You feel burning or tingling in your legs or feet.  You have pain or cramps in your legs and feet.  Your legs or feet are numb.  Your feet always feel cold.  You have pain around a toenail. Get help right away if:  You have a wound, scrape, corn, or callus on your foot and: ? You have pain, swelling, or redness that gets worse. ? You have fluid or blood coming from the wound, scrape, corn, or callus. ? Your wound, scrape, corn, or callus feels warm to the touch. ? You have pus or a bad smell coming from the wound, scrape, corn, or callus. ? You have a fever. ? You have a red line going up your leg. Summary  Check your feet every day  for cuts, sores, red spots, swelling, and blisters.  Moisturize feet and legs daily.  Wear shoes that fit properly and have enough cushioning.  If you have foot problems, report any cuts, sores, or bruises to your health care provider immediately.  Schedule a complete foot exam at least once a year (annually) or more often if you have foot problems. This information is not intended to replace advice given to you by your health care provider. Make sure you discuss any questions you have with your health care provider. Document Revised: 03/13/2019 Document Reviewed: 07/22/2016 Elsevier Patient Education  2020 Elsevier Inc[employer]   Plantar Fasciitis (Heel Spur Syndrome) with Rehab The plantar fascia is a fibrous, ligament-like, soft-tissue structure that spans the bottom of the foot. Plantar fasciitis is a condition that causes pain in the foot due to inflammation of the tissue. SYMPTOMS  Pain and tenderness on the underneath side of the foot. Pain that worsens with standing or walking. CAUSES  Plantar fasciitis is caused by irritation and injury to the plantar fascia on the underneath side of the foot. Common mechanisms of injury include: Direct trauma to bottom of the foot. Damage to a small nerve that runs under the foot where the main fascia attaches to the heel bone. Stress placed on the plantar fascia due to bone spurs. RISK INCREASES WITH:  Activities that place stress on the plantar fascia (running, jumping, pivoting, or cutting). Poor strength and flexibility. Improperly fitted shoes. Tight calf muscles. Flat feet. Failure to warm-up properly before activity. Obesity. PREVENTION Warm up and stretch properly before activity. Allow for adequate recovery between workouts. Maintain physical fitness: Strength, flexibility, and endurance. Cardiovascular fitness. Maintain a health body weight. Avoid stress on the plantar fascia. Wear properly fitted shoes, including arch supports  for individuals who have flat feet.  PROGNOSIS  If treated properly, then the symptoms of plantar fasciitis usually resolve without surgery. However, occasionally surgery is necessary.  RELATED COMPLICATIONS  Recurrent symptoms that may result in a chronic condition. Problems of the lower back that are caused by compensating for the injury, such as limping. Pain or weakness of the foot during push-off following surgery. Chronic inflammation, scarring, and partial or complete fascia tear, occurring more often from repeated injections.  TREATMENT  Treatment initially involves the use of ice and medication to help reduce pain and inflammation. The use of strengthening and stretching exercises may help reduce pain with activity, especially stretches of the Achilles tendon. These exercises may be performed at home or with a therapist. Your caregiver may recommend that you use heel cups of arch supports to help reduce stress on the plantar fascia. Occasionally,  corticosteroid injections are given to reduce inflammation. If symptoms persist for greater than 6 months despite non-surgical (conservative), then surgery may be recommended.   MEDICATION  If pain medication is necessary, then nonsteroidal anti-inflammatory medications, such as aspirin and ibuprofen, or other minor pain relievers, such as acetaminophen, are often recommended. Do not take pain medication within 7 days before surgery. Prescription pain relievers may be given if deemed necessary by your caregiver. Use only as directed and only as much as you need. Corticosteroid injections may be given by your caregiver. These injections should be reserved for the most serious cases, because they may only be given a certain number of times.  HEAT AND COLD Cold treatment (icing) relieves pain and reduces inflammation. Cold treatment should be applied for 10 to 15 minutes every 2 to 3 hours for inflammation and pain and immediately after any  activity that aggravates your symptoms. Use ice packs or massage the area with a piece of ice (ice massage). Heat treatment may be used prior to performing the stretching and strengthening activities prescribed by your caregiver, physical therapist, or athletic trainer. Use a heat pack or soak the injury in warm water.  SEEK IMMEDIATE MEDICAL CARE IF: Treatment seems to offer no benefit, or the condition worsens. Any medications produce adverse side effects.  EXERCISES- RANGE OF MOTION (ROM) AND STRETCHING EXERCISES - Plantar Fasciitis (Heel Spur Syndrome) These exercises may help you when beginning to rehabilitate your injury. Your symptoms may resolve with or without further involvement from your physician, physical therapist or athletic trainer. While completing these exercises, remember:  Restoring tissue flexibility helps normal motion to return to the joints. This allows healthier, less painful movement and activity. An effective stretch should be held for at least 30 seconds. A stretch should never be painful. You should only feel a gentle lengthening or release in the stretched tissue.  RANGE OF MOTION - Toe Extension, Flexion Sit with your right / left leg crossed over your opposite knee. Grasp your toes and gently pull them back toward the top of your foot. You should feel a stretch on the bottom of your toes and/or foot. Hold this stretch for 10 seconds. Now, gently pull your toes toward the bottom of your foot. You should feel a stretch on the top of your toes and or foot. Hold this stretch for 10 seconds. Repeat  times. Complete this stretch 3 times per day.   RANGE OF MOTION - Ankle Dorsiflexion, Active Assisted Remove shoes and sit on a chair that is preferably not on a carpeted surface. Place right / left foot under knee. Extend your opposite leg for support. Keeping your heel down, slide your right / left foot back toward the chair until you feel a stretch at your ankle or  calf. If you do not feel a stretch, slide your bottom forward to the edge of the chair, while still keeping your heel down. Hold this stretch for 10 seconds. Repeat 3 times. Complete this stretch 2 times per day.   STRETCH  Gastroc, Standing Place hands on wall. Extend right / left leg, keeping the front knee somewhat bent. Slightly point your toes inward on your back foot. Keeping your right / left heel on the floor and your knee straight, shift your weight toward the wall, not allowing your back to arch. You should feel a gentle stretch in the right / left calf. Hold this position for 10 seconds. Repeat 3 times. Complete this stretch 2 times per  day.  STRETCH  Soleus, Standing Place hands on wall. Extend right / left leg, keeping the other knee somewhat bent. Slightly point your toes inward on your back foot. Keep your right / left heel on the floor, bend your back knee, and slightly shift your weight over the back leg so that you feel a gentle stretch deep in your back calf. Hold this position for 10 seconds. Repeat 3 times. Complete this stretch 2 times per day.  STRETCH  Gastrocsoleus, Standing  Note: This exercise can place a lot of stress on your foot and ankle. Please complete this exercise only if specifically instructed by your caregiver.  Place the ball of your right / left foot on a step, keeping your other foot firmly on the same step. Hold on to the wall or a rail for balance. Slowly lift your other foot, allowing your body weight to press your heel down over the edge of the step. You should feel a stretch in your right / left calf. Hold this position for 10 seconds. Repeat this exercise with a slight bend in your right / left knee. Repeat 3 times. Complete this stretch 2 times per day.   STRENGTHENING EXERCISES - Plantar Fasciitis (Heel Spur Syndrome)  These exercises may help you when beginning to rehabilitate your injury. They may resolve your symptoms with or without  further involvement from your physician, physical therapist or athletic trainer. While completing these exercises, remember:  Muscles can gain both the endurance and the strength needed for everyday activities through controlled exercises. Complete these exercises as instructed by your physician, physical therapist or athletic trainer. Progress the resistance and repetitions only as guided.  STRENGTH - Towel Curls Sit in a chair positioned on a non-carpeted surface. Place your foot on a towel, keeping your heel on the floor. Pull the towel toward your heel by only curling your toes. Keep your heel on the floor. Repeat 3 times. Complete this exercise 2 times per day.  STRENGTH - Ankle Inversion Secure one end of a rubber exercise band/tubing to a fixed object (table, pole). Loop the other end around your foot just before your toes. Place your fists between your knees. This will focus your strengthening at your ankle. Slowly, pull your big toe up and in, making sure the band/tubing is positioned to resist the entire motion. Hold this position for 10 seconds. Have your muscles resist the band/tubing as it slowly pulls your foot back to the starting position. Repeat 3 times. Complete this exercises 2 times per day.  Document Released: 06/20/2005 Document Revised: 09/12/2011 Document Reviewed: 10/02/2008 [facility] Patient Information 2014 [employer].  Onychomycosis/Fungal Toenails  WHAT IS IT? An infection that lies within the keratin of your nail plate that is caused by a fungus.  WHY ME? Fungal infections affect all ages, sexes, races, and creeds.  There may be many factors that predispose you to a fungal infection such as age, coexisting medical conditions such as diabetes, or an autoimmune disease; stress, medications, fatigue, genetics, etc.  Bottom line: fungus thrives in a warm, moist environment and your shoes offer such a location.  IS IT CONTAGIOUS? Theoretically, yes.  You do not  want to share shoes, nail clippers or files with someone who has fungal toenails.  Walking around barefoot in the same room or sleeping in the same bed is unlikely to transfer the organism.  It is important to realize, however, that fungus can spread easily from one nail to the next on  the same foot.  HOW DO WE TREAT THIS?  There are several ways to treat this condition.  Treatment may depend on many factors such as age, medications, pregnancy, liver and kidney conditions, etc.  It is best to ask your doctor which options are available to you.  No treatment.   Unlike many other medical concerns, you can live with this condition.  However for many people this can be a painful condition and may lead to ingrown toenails or a bacterial infection.  It is recommended that you keep the nails cut short to help reduce the amount of fungal nail. Topical treatment.  These range from herbal remedies to prescription strength nail lacquers.  About 40-50% effective, topicals require twice daily application for approximately 9 to 12 months or until an entirely new nail has grown out.  The most effective topicals are medical grade medications available through physicians offices. Oral antifungal medications.  With an 80-90% cure rate, the most common oral medication requires 3 to 4 months of therapy and stays in your system for a year as the new nail grows out.  Oral antifungal medications do require blood work to make sure it is a safe drug for you.  A liver function panel will be performed prior to starting the medication and after the first month of treatment.  It is important to have the blood work performed to avoid any harmful side effects.  In general, this medication safe but blood work is required. Laser Therapy.  This treatment is performed by applying a specialized laser to the affected nail plate.  This therapy is noninvasive, fast, and non-painful.  It is not covered by insurance and is therefore, out of pocket.   The results have been very good with a 80-95% cure rate.  The [facility] is the only practice in the area to offer this therapy. Permanent Nail Avulsion.  Removing the entire nail so that a new nail will not grow back.

## 2020-01-17 ENCOUNTER — Other Ambulatory Visit: Payer: Self-pay

## 2020-01-17 MED ORDER — MELOXICAM 7.5 MG PO TABS: ORAL_TABLET | ORAL | 0 refills | Status: DC

## 2020-01-17 NOTE — Progress Notes (Signed)
Subjective: Cheryl Carroll presents today referred by Joana Reamer, DO for diabetic foot evaluation.  Patient relates 2 year history of diabetes.  Patient denies any history of foot wounds.  Patient does relate numbness in her feet, but thinks it is due to her sciatica.  Today, patient c/o of painful, discolored, thick toenails which interfere with daily activities.  Pain is aggravated when wearing enclosed shoe gear.   Patient also relates heel pain to left foot for the past few weeks. She relates pain with first step in the morning and after periods of rest. She works on her feet quite a bit.   Past Medical History:  Diagnosis Date  . ACE-inhibitor cough 12/03/2015  . Diabetes mellitus   . Hyperlipidemia   . Hypertension   . Sarcoidosis of lung [facility])     Patient Active Problem List   Diagnosis Date Noted  . Plantar fasciitis 11/15/2019  . Sciatica of right side 04/01/2019  . Allergic rhinitis due to pollen 12/03/2015  . Diabetes mellitus (HCC) 01/27/2011  . Hyperlipidemia associated with type 2 diabetes mellitus (HCC) 12/01/2006  . H/O sarcoidosis 08/31/2006  . Hypertension associated with diabetes (HCC) 08/31/2006    Past Surgical History:  Procedure Laterality Date  . TUBAL LIGATION      Current Outpatient Medications on File Prior to Visit  Medication Sig Dispense Refill  . canagliflozin (INVOKANA) 100 MG TABS tablet Take 1 tablet (100 mg total) by mouth daily before breakfast. 90 tablet 1  . gabapentin (NEURONTIN) 100 MG capsule Take by mouth.    Marland Kitchen glucose blood (ONE TOUCH TEST STRIPS) test strip Patient is to test one time a day DX E11.9 100 each 3  . meloxicam (MOBIC) 7.5 MG tablet TAKE 1 TABLET(7.5 MG) BY MOUTH DAILY 30 tablet 0  . metFORMIN (GLUCOPHAGE) 500 MG tablet TAKE 1 TABLET(500 MG) BY MOUTH ONCE DAILY WITH A MEAL 90 tablet 1  . olmesartan-hydrochlorothiazide (BENICAR HCT) 40-12.5 MG tablet Take 1 tablet by mouth daily. 90 tablet 3  . ONETOUCH DELICA  LANCETS FINE MISC Patient is to test one time a day DX E11.9 100 each 3  . rosuvastatin (CRESTOR) 40 MG tablet Take 1 tablet (40 mg total) by mouth daily. 90 tablet 2   No current facility-administered medications on file prior to visit.     No Known Allergies  Social History   Occupational History  . Not on file  Tobacco Use  . Smoking status: Never Smoker  . Smokeless tobacco: Never Used  Vaping Use  . Vaping Use: Never used  Substance and Sexual Activity  . Alcohol use: No  . Drug use: No  . Sexual activity: Not Currently    Birth control/protection: Surgical    Family History  Problem Relation Age of Onset  . Diabetes Mother   . Hyperlipidemia Mother   . Hypertension Mother   . Cancer Mother 8       Breast cancer  . Hyperlipidemia Father   . Hypertension Father   . Diabetes Father     Immunization History  Administered Date(s) Administered  . Influenza,inj,Quad PF,6+ Mos 04/18/2013, 04/09/2014, 04/04/2016, 04/04/2017, 03/20/2018, 04/01/2019  . Pneumococcal Conjugate-13 04/09/2014  . Pneumococcal Polysaccharide-23 04/04/2017  . Td 11/01/1997  . Tdap 01/24/2011    Review of systems: Positive Findings in bold print.  Constitutional:  chills, fatigue, fever, sweats, weight change Communication: Nurse, learning disability, sign Presenter, broadcasting, hand writing, iPad/Android device Head: headaches, head injury Eyes: changes in vision, eye pain, glaucoma,  cataracts, macular degeneration, diplopia, glare,  light sensitivity, eyeglasses or contacts, blindness Ears nose mouth throat: hearing impaired, hearing aids,  ringing in ears, deaf, sign language,  vertigo, nosebleeds,  rhinitis,  cold sores, snoring, swollen glands Cardiovascular: HTN, edema, arrhythmia, pacemaker in place, defibrillator in place, chest pain/tightness, chronic anticoagulation, blood clot, heart failure, MI Peripheral Vascular: leg cramps, varicose veins, blood clots, lymphedema, varicosities Respiratory:   difficulty breathing, denies congestion, SOB, wheezing, cough, emphysema Gastrointestinal: change in appetite or weight, abdominal pain, constipation, diarrhea, nausea, vomiting, vomiting blood, change in bowel habits, abdominal pain, jaundice, rectal bleeding, hemorrhoids, GERD Genitourinary:  nocturia,  pain on urination, polyuria,  blood in urine, Foley catheter, urinary urgency, ESRD on hemodialysis Musculoskeletal: amputation, cramping, stiff joints, painful joints, decreased joint motion, fractures, OA, gout, hemiplegia, paraplegia, uses cane, wheelchair bound, uses [place], uses rollator Skin: +changes in toenails, color change, dryness, itching, mole changes,  rash, wound(s) Neurological: headaches, numbness in feet, paresthesias in feet, burning in feet, fainting,  seizures, change in speech,  headaches, memory problems/poor historian, cerebral palsy, weakness, paralysis, CVA, TIA Endocrine: diabetes, hypothyroidism, hyperthyroidism,  goiter, dry mouth, flushing, heat intolerance,  cold intolerance,  excessive thirst, denies polyuria,  nocturia Hematological:  easy bleeding, excessive bleeding, easy bruising, enlarged lymph nodes, on long term blood thinner, history of past transusions Allergy/immunological:  hives, eczema, frequent infections, multiple drug allergies, seasonal allergies, transplant recipient, multiple food allergies Psychiatric:  anxiety, depression, mood disorder, suicidal ideations, hallucinations, insomnia  Objective: There were no vitals filed for this visit.  Cheryl Carroll is a/an 54 y.o. female WD, WN in NAD.Marland Kitchen AAO X 3.  Vascular Examination: Capillary refill time to digits immediate b/l. Palpable pedal pulses b/l LE. Pedal hair present. Lower extremity skin temperature gradient within normal limits. No pain with calf compression b/l. No edema noted b/l lower extremities.  Dermatological Examination: Pedal skin with normal turgor, texture and tone bilaterally. No  open wounds bilaterally. No interdigital macerations bilaterally. Toenails 1-5 b/l elongated, discolored, dystrophic, thickened, crumbly with subungual debris and tenderness to dorsal palpation. Porokeratotic lesion(s) submet head 2 right foot. No erythema, no edema, no drainage, no flocculence.  Musculoskeletal Examination: Normal muscle strength 5/5 to all lower extremity muscle groups bilaterally. No pain crepitus or joint limitation noted with ROM b/l. Hallux valgus with bunion deformity noted b/l lower extremities. Hammertoes noted to the 2-5 bilaterally.  Footwear Assessment: Does the patient wear appropriate shoes? Yes. Does the patient need inserts/orthotics? Yes.  Neurological Examination: Protective sensation intact 5/5 intact bilaterally with 10g monofilament b/l. Vibratory sensation intact b/l. Proprioception intact bilaterally. Deep tendon reflexes normal b/l.  Clonus negative b/l.   Xray Left foot, 3 views weightbearing: No gas in tissues. Normal mineralization throughout foot. +Pes planus foot  +plantar calcaneal spur present  Hemoglobin A1C Latest Ref Rng & Units 10/24/2019 04/01/2019  HGBA1C 0.0 - 7.0 % 8.2(A) 7.4(A)  Some recent data might be hidden   Assessment: 1. Pain due to onychomycosis of toenails of both feet   2. Porokeratosis   3. Plantar fasciitis of left foot   4. Calcaneal spur of left foot   5. Type 2 diabetes mellitus without complication, without long-term current use of insulin (HCC)   6. Encounter for diabetic foot exam (HCC)     ADA Risk Categorization:  Low Risk:  Patient has all of the following: Intact protective sensation No prior foot ulcer  No severe deformity Pedal pulses present Plan: -Examined patient. -Diabetic foot examination performed on today's  visit. Discussed diabetic foot care principles. Literature dispensed. -Toenails 1-5 b/l were debrided in length and girth with sterile nail nippers and dremel without iatrogenic bleeding.   -Porokeratosis submet head 2 right foot pared and enucleated with sterile scalpel blade without incident. -Patient to report any pedal injuries to medical professional immediately. -Xrays (3V, weightbearing) of left foot was performed and reviewed with patient and/or POA. -Discussed plantar fasciitis and treatment options. She declined steroid injection on today's visit. Dispensed written instructions on stretching exercises for plantar fasciitis. -Dispensed plantar fascial brace for left foot for plantar fasciitis. Patient instructed to wear daily and remove every evening. -Will check benefits for orthotics for plantar fasciitis.  -Patient to continue soft, supportive shoe gear daily. -Patient/POA to call should there be question/concern in the interim.  Return in about 3 months (around 04/14/2020) for diabetic nail trim.

## 2020-01-18 ENCOUNTER — Other Ambulatory Visit: Payer: Self-pay | Admitting: Family Medicine

## 2020-01-30 ENCOUNTER — Other Ambulatory Visit: Payer: Self-pay

## 2020-01-30 ENCOUNTER — Ambulatory Visit (INDEPENDENT_AMBULATORY_CARE_PROVIDER_SITE_OTHER): Payer: BC Managed Care – PPO | Admitting: Orthotics

## 2020-01-30 DIAGNOSIS — Q828 Other specified congenital malformations of skin: Secondary | ICD-10-CM

## 2020-01-30 DIAGNOSIS — M722 Plantar fascial fibromatosis: Secondary | ICD-10-CM

## 2020-01-30 DIAGNOSIS — M79674 Pain in right toe(s): Secondary | ICD-10-CM

## 2020-01-30 DIAGNOSIS — B351 Tinea unguium: Secondary | ICD-10-CM

## 2020-01-30 NOTE — Progress Notes (Signed)

## 2020-02-14 ENCOUNTER — Other Ambulatory Visit: Payer: Self-pay | Admitting: Family Medicine

## 2020-02-20 ENCOUNTER — Other Ambulatory Visit: Payer: Self-pay

## 2020-02-20 ENCOUNTER — Ambulatory Visit: Payer: BC Managed Care – PPO | Admitting: Orthotics

## 2020-02-20 DIAGNOSIS — B351 Tinea unguium: Secondary | ICD-10-CM

## 2020-02-20 DIAGNOSIS — M79675 Pain in left toe(s): Secondary | ICD-10-CM

## 2020-02-20 DIAGNOSIS — Q828 Other specified congenital malformations of skin: Secondary | ICD-10-CM

## 2020-02-20 NOTE — Progress Notes (Signed)
Patient came in today to pick up custom made foot orthotics.  The goals were accomplished and the patient reported no dissatisfaction with said orthotics.  Patient was advised of breakin period and how to report any issues. 

## 2020-03-15 ENCOUNTER — Other Ambulatory Visit: Payer: Self-pay | Admitting: Family Medicine

## 2020-04-14 ENCOUNTER — Ambulatory Visit: Payer: BC Managed Care – PPO | Admitting: Podiatry

## 2020-04-14 ENCOUNTER — Other Ambulatory Visit: Payer: Self-pay | Admitting: *Deleted

## 2020-04-14 DIAGNOSIS — E1169 Type 2 diabetes mellitus with other specified complication: Secondary | ICD-10-CM

## 2020-04-14 DIAGNOSIS — E785 Hyperlipidemia, unspecified: Secondary | ICD-10-CM

## 2020-04-14 MED ORDER — ROSUVASTATIN CALCIUM 40 MG PO TABS: 40.0000 mg | ORAL_TABLET | Freq: Every day | ORAL | 2 refills | Status: DC

## 2020-04-16 ENCOUNTER — Other Ambulatory Visit: Payer: Self-pay | Admitting: Family Medicine

## 2020-04-20 ENCOUNTER — Telehealth: Payer: Self-pay

## 2020-04-20 MED ORDER — MELOXICAM 7.5 MG PO TABS: ORAL_TABLET | ORAL | 0 refills | Status: DC

## 2020-04-20 NOTE — Telephone Encounter (Signed)
Due to system wide e-prescribing issues on 10/14-10/15, the pharmacy never received Meloxicam. Will send in now.

## 2020-04-21 ENCOUNTER — Other Ambulatory Visit: Payer: Self-pay | Admitting: *Deleted

## 2020-04-21 DIAGNOSIS — E785 Hyperlipidemia, unspecified: Secondary | ICD-10-CM

## 2020-04-21 DIAGNOSIS — E1169 Type 2 diabetes mellitus with other specified complication: Secondary | ICD-10-CM

## 2020-04-22 MED ORDER — ROSUVASTATIN CALCIUM 40 MG PO TABS: 40.0000 mg | ORAL_TABLET | Freq: Every day | ORAL | 2 refills | Status: DC

## 2020-05-01 ENCOUNTER — Ambulatory Visit: Payer: BC Managed Care – PPO | Admitting: Family Medicine

## 2020-05-11 ENCOUNTER — Other Ambulatory Visit: Payer: Self-pay

## 2020-05-11 ENCOUNTER — Ambulatory Visit: Payer: BC Managed Care – PPO | Admitting: Family Medicine

## 2020-05-11 VITALS — BP 122/80 | HR 102 | Ht 62.0 in | Wt 165.2 lb

## 2020-05-11 DIAGNOSIS — Z1211 Encounter for screening for malignant neoplasm of colon: Secondary | ICD-10-CM

## 2020-05-11 DIAGNOSIS — E1159 Type 2 diabetes mellitus with other circulatory complications: Secondary | ICD-10-CM

## 2020-05-11 DIAGNOSIS — E1169 Type 2 diabetes mellitus with other specified complication: Secondary | ICD-10-CM | POA: Diagnosis not present

## 2020-05-11 DIAGNOSIS — E6609 Other obesity due to excess calories: Secondary | ICD-10-CM

## 2020-05-11 DIAGNOSIS — I152 Hypertension secondary to endocrine disorders: Secondary | ICD-10-CM

## 2020-05-11 DIAGNOSIS — Z6830 Body mass index (BMI) 30.0-30.9, adult: Secondary | ICD-10-CM

## 2020-05-11 DIAGNOSIS — E66811 Obesity, class 1: Secondary | ICD-10-CM

## 2020-05-11 DIAGNOSIS — E119 Type 2 diabetes mellitus without complications: Secondary | ICD-10-CM

## 2020-05-11 DIAGNOSIS — E785 Hyperlipidemia, unspecified: Secondary | ICD-10-CM | POA: Diagnosis not present

## 2020-05-11 DIAGNOSIS — Z1231 Encounter for screening mammogram for malignant neoplasm of breast: Secondary | ICD-10-CM

## 2020-05-11 LAB — POCT GLYCOSYLATED HEMOGLOBIN (HGB A1C): Hemoglobin A1C: 9.9 % — AB (ref 4.0–5.6)

## 2020-05-11 MED ORDER — DAPAGLIFLOZIN PROPANEDIOL 10 MG PO TABS: 10.0000 mg | ORAL_TABLET | Freq: Every day | ORAL | 1 refills | Status: DC

## 2020-05-11 MED ORDER — METFORMIN HCL ER 500 MG PO TB24: 500.0000 mg | ORAL_TABLET | Freq: Every day | ORAL | 1 refills | Status: DC

## 2020-05-11 NOTE — Progress Notes (Signed)
Subjective:   Patient ID: Cheryl Carroll    DOB: Apr 09, 1966, 54 y.o. female   MRN: [id number]  Cheryl Carroll is a 54 y.o. female with a history of HTN, T2DM, allergic rhinitis, HLD, sciatica, plantar fasciitis, h/o scaroidosis here for chronic disease management.  Diabetes: Last three A1C's below. Currently on Metformin 500mg  QD, Farxiga 5mg  QD. Denies taking Metformin or because it makes her nauseous. Denies any polyuria, polydipsia, polyphagia.   Lab Results  Component Value Date   HGBA1C 9.9 (A) 05/11/2020   HGBA1C 8.2 (A) 10/24/2019   HGBA1C 7.4 (A) 04/01/2019    HTN:  BP: 122/80 today. Currently on Omlesartan-HCTZ 40-12.5mg  QD. Endorses compliance. Non-smoker. Denies any chest pain, SOB, vision changes, or headaches.    HLD: Last lipid panel below. Currently on Crestor 40mg  QD. Endorses compliance. Denies any muscles aches or weakness. The 10-year ASCVD risk score 10/26/2019 DC Jr., et al., 2013) is: 9.2%   Lab Results  Component Value Date   CHOL 178 10/24/2019   HDL 49 10/24/2019   LDLCALC 100 (H) 10/24/2019   TRIG 165 (H) 10/24/2019   CHOLHDL 3.6 10/24/2019   Health Maintenance: Health Maintenance Due  Topic  . MAMMOGRAM   . COLONOSCOPY   . PAP SMEAR-Modifier    Review of Systems:  Per HPI.   Objective:   BP 122/80   Pulse (!) 102   Ht 5\' 2"  (1.575 m)   Wt 165 lb 3.2 oz (74.9 kg)   SpO2 98%   BMI 30.22 kg/m  Vitals and nursing note reviewed.  General: pleasant older woman, sitting comfortably in exam chair, well nourished, well developed, in no acute distress with non-toxic appearance CV: regular rate and rhythm without murmurs, rubs, or gallops,  2+ radial and pedal pulses bilaterally Lungs: clear to auscultation bilaterally with normal work of breathing on room air Resp: speaking in full sentences Abdomen: soft, non-tender, non-distended, normoactive bowel sounds MSK: gait normal Neuro: Alert and oriented, speech normal  Assessment & Plan:    Hypertension associated with diabetes (HCC) Chronic, well controlled.  Non-smoker.  On statin. -Continue olmesartan-HCTZ 40-12.5 mg daily -BMP obtained to monitor kidney function  Hyperlipidemia associated with type 2 diabetes mellitus (HCC) Chronic. -LDL to monitor compliance and improvement -Continue Crestor 40 mg daily.  Will adjust if indicated  Diabetes mellitus Chronic, uncontrolled.  Has not been taking Metformin or 10/26/2019.  History of nausea with Metformin.  A1c increased from 8.2 to 9.9.  Patient is open to restarting medications. -Trial of long-acting Metformin XR 500 mg with largest meal -Restart Farxiga and increase to 10 mg daily -Patient instructed to call if she has side effects with medications - Close follow-up scheduled in 1 month to ensure compliance and tolerability -Repeat BMP at that time  Health Maintenance: - Cologuard for colon cancer screen. Patient declined colonoscopy. NO family history of colon cancer. - Mammogram ordered. Patient to call to schedule at earliest convenience  Orders Placed This Encounter  Procedures  . LDL Cholesterol, Direct  . Basic Metabolic Panel  . Cologuard  . POCT glycosylated hemoglobin (Hb A1C)   Meds ordered this encounter  Medications  . dapagliflozin propanediol (FARXIGA) 10 MG TABS tablet    Sig: Take 1 tablet (10 mg total) by mouth daily.    Dispense:  30 tablet    Refill:  1  . metFORMIN (GLUCOPHAGE XR) 500 MG 24 hr tablet    Sig: Take 1 tablet (500 mg total) by mouth  daily with breakfast.    Dispense:  30 tablet    Refill:  1    Orpah Cobb, DO PGY-3, [facility] 05/11/2020 2:07 PM

## 2020-05-11 NOTE — Patient Instructions (Signed)
It was a pleasure to see you today!  Thank you for choosing [facility] for your primary care.  Cheryl Carroll was seen for diabetes    Our plans for today were:  I have obtained labs. I will call you with the results if they are abnormal and send a letter  I will call you with the plan regarding your diabetes.  Your blood pressure looks great! Continue taking your blood pressure medication as prescribed  I have ordered the Cologuard (colon cancer screen). You will receive a package in the mail with instruction. You will need to complete as instructed and return back in the mail.  You mammogram has been ordered. You just need to call and schedule.   To keep you healthy, please keep in mind the following health maintenance items that you are due for:   1. Colon cancer screen - complete cologaurd  2.  Mammogram - call to schedule 3. Pap-smear - we will discuss at next visit   You should return to our clinic in 3 months for diabetes follow up.   Best Wishes,   Orpah Cobb, DO

## 2020-05-11 NOTE — Assessment & Plan Note (Signed)
Chronic, well controlled.  Non-smoker.  On statin. -Continue olmesartan-HCTZ 40-12.5 mg daily -BMP obtained to monitor kidney function

## 2020-05-11 NOTE — Assessment & Plan Note (Signed)
Chronic, uncontrolled.  Has not been taking Metformin or [place].  History of nausea with Metformin.  A1c increased from 8.2 to 9.9.  Patient is open to restarting medications. -Trial of long-acting Metformin XR 500 mg with largest meal -Restart Farxiga and increase to 10 mg daily -Patient instructed to call if she has side effects with medications - Close follow-up scheduled in 1 month to ensure compliance and tolerability -Repeat BMP at that time

## 2020-05-11 NOTE — Assessment & Plan Note (Signed)
Chronic. -LDL to monitor compliance and improvement -Continue Crestor 40 mg daily.  Will adjust if indicated

## 2020-05-12 LAB — BASIC METABOLIC PANEL
BUN/Creatinine Ratio: 15 (ref 9–23)
BUN: 12 mg/dL (ref 6–24)
CO2: 24 mmol/L (ref 20–29)
Calcium: 10.4 mg/dL — ABNORMAL HIGH (ref 8.7–10.2)
Chloride: 100 mmol/L (ref 96–106)
Creatinine, Ser: 0.79 mg/dL (ref 0.57–1.00)
GFR calc Af Amer: 98 mL/min/{1.73_m2} (ref >59)
GFR calc non Af Amer: 85 mL/min/{1.73_m2} (ref >59)
Glucose: 157 mg/dL — ABNORMAL HIGH (ref 65–99)
Potassium: 4.1 mmol/L (ref 3.5–5.2)
Sodium: 139 mmol/L (ref 134–144)

## 2020-05-12 LAB — LDL CHOLESTEROL, DIRECT: LDL Direct: 74 mg/dL (ref 0–99)

## 2020-05-14 ENCOUNTER — Encounter: Payer: Self-pay | Admitting: Family Medicine

## 2020-05-15 ENCOUNTER — Telehealth: Payer: Self-pay

## 2020-05-15 NOTE — Telephone Encounter (Signed)
Patient calls nurse line requesting abx for possible sinus infection. Patient reports swollen sinuses and burning sensation in nose and throat since yesterday. Patient also reports headache and ear pain. Denies fever, chills, body aches.   Patient does not want to be evaluated in UC due to cost and is requesting medication be sent in as soon as possible. Informed patient of rx request policy. Strict ED/ return precautions given.   Veronda Prude, RN

## 2020-05-15 NOTE — Telephone Encounter (Signed)
I dont like prescribing antibiotics unless they have been evaluated. She may come in on Monday or go to Urgent Care for evaluation.

## 2020-05-17 ENCOUNTER — Other Ambulatory Visit: Payer: Self-pay | Admitting: Family Medicine

## 2020-06-11 ENCOUNTER — Other Ambulatory Visit: Payer: Self-pay | Admitting: Family Medicine

## 2020-06-22 DIAGNOSIS — E119 Type 2 diabetes mellitus without complications: Secondary | ICD-10-CM | POA: Diagnosis not present

## 2020-06-23 ENCOUNTER — Ambulatory Visit: Payer: BC Managed Care – PPO | Admitting: Family Medicine

## 2020-07-13 ENCOUNTER — Other Ambulatory Visit: Payer: Self-pay | Admitting: Family Medicine

## 2020-07-15 ENCOUNTER — Ambulatory Visit (INDEPENDENT_AMBULATORY_CARE_PROVIDER_SITE_OTHER): Payer: BC Managed Care – PPO | Admitting: Podiatry

## 2020-07-15 ENCOUNTER — Encounter: Payer: Self-pay | Admitting: Podiatry

## 2020-07-15 ENCOUNTER — Other Ambulatory Visit: Payer: Self-pay

## 2020-07-15 DIAGNOSIS — E119 Type 2 diabetes mellitus without complications: Secondary | ICD-10-CM

## 2020-07-15 DIAGNOSIS — M2141 Flat foot [pes planus] (acquired), right foot: Secondary | ICD-10-CM

## 2020-07-15 DIAGNOSIS — M79674 Pain in right toe(s): Secondary | ICD-10-CM

## 2020-07-15 DIAGNOSIS — M2142 Flat foot [pes planus] (acquired), left foot: Secondary | ICD-10-CM

## 2020-07-15 DIAGNOSIS — Q828 Other specified congenital malformations of skin: Secondary | ICD-10-CM

## 2020-07-15 DIAGNOSIS — B351 Tinea unguium: Secondary | ICD-10-CM | POA: Diagnosis not present

## 2020-07-15 DIAGNOSIS — M79675 Pain in left toe(s): Secondary | ICD-10-CM | POA: Diagnosis not present

## 2020-07-15 DIAGNOSIS — M722 Plantar fascial fibromatosis: Secondary | ICD-10-CM

## 2020-07-19 NOTE — Progress Notes (Signed)
Subjective:  Patient ID: Cheryl Carroll, female    DOB: [birth year],  MRN: [id number]  55 y.o. female presents with preventative diabetic foot care.    Patient did not check blood glucose this morning. She states she has not checked blood glucose in over a month because her meter is broken.  PCP: Joana Reamer, DO and last visit was: 05/11/2020.  She has received her orthotics and feel they are helping with her heel pain.   Review of Systems: Negative except as noted in the HPI.  Past Medical History:  Diagnosis Date  . ACE-inhibitor cough 12/03/2015  . Diabetes mellitus   . Hyperlipidemia   . Hypertension   . Sarcoidosis of lung [facility])    Past Surgical History:  Procedure Laterality Date  . TUBAL LIGATION     Patient Active Problem List   Diagnosis Date Noted  . Plantar fasciitis 11/15/2019  . Sciatica of right side 04/01/2019  . Allergic rhinitis due to pollen 12/03/2015  . Diabetes mellitus (HCC) 01/27/2011  . Hyperlipidemia associated with type 2 diabetes mellitus (HCC) 12/01/2006  . H/O sarcoidosis 08/31/2006  . Hypertension associated with diabetes (HCC) 08/31/2006    Current Outpatient Medications:  .  dapagliflozin propanediol (FARXIGA) 10 MG TABS tablet, Take 1 tablet (10 mg total) by mouth daily., Disp: 30 tablet, Rfl: 1 .  gabapentin (NEURONTIN) 100 MG capsule, Take by mouth., Disp: , Rfl:  .  glucose blood (ONE TOUCH TEST STRIPS) test strip, Patient is to test one time a day DX E11.9, Disp: 100 each, Rfl: 3 .  meloxicam (MOBIC) 7.5 MG tablet, TAKE 1 TABLET BY MOUTH DAILY, Disp: 30 tablet, Rfl: 0 .  metFORMIN (GLUCOPHAGE XR) 500 MG 24 hr tablet, Take 1 tablet (500 mg total) by mouth daily with breakfast., Disp: 30 tablet, Rfl: 1 .  olmesartan-hydrochlorothiazide (BENICAR HCT) 40-12.5 MG tablet, Take 1 tablet by mouth daily., Disp: 90 tablet, Rfl: 3 .  ONETOUCH DELICA LANCETS FINE MISC, Patient is to test one time a day DX E11.9, Disp: 100 each, Rfl: 3 .   rosuvastatin (CRESTOR) 40 MG tablet, Take 1 tablet (40 mg total) by mouth daily., Disp: 90 tablet, Rfl: 2 No Known Allergies Social History   Tobacco Use  Smoking Status Never Smoker  Smokeless Tobacco Never Used    Objective:  There were no vitals filed for this visit. Constitutional Patient is a pleasant 55 y.o. African American female in NAD. AAO x 3.  Vascular Capillary refill time to digits immediate b/l. Palpable pedal pulses b/l LE. Pedal hair present. Lower extremity skin temperature gradient within normal limits. No pain with calf compression b/l. No cyanosis or clubbing noted.  Neurologic Normal speech. Protective sensation intact 5/5 intact bilaterally with 10g monofilament b/l. Vibratory sensation intact b/l. Proprioception intact bilaterally.  Dermatologic Pedal skin with normal turgor, texture and tone bilaterally. No open wounds bilaterally. No interdigital macerations bilaterally. Toenails 1-5 b/l elongated, discolored, dystrophic, thickened, crumbly with subungual debris and tenderness to dorsal palpation. Porokeratotic lesion(s) submet head 2 right foot. No erythema, no edema, no drainage, no fluctuance.  Orthopedic: Normal muscle strength 5/5 to all lower extremity muscle groups bilaterally. No pain crepitus or joint limitation noted with ROM b/l. Hallux valgus with bunion deformity noted b/l lower extremities. Hammertoes noted to the 2-5 bilaterally. Pes planus deformity noted b/l.    Hemoglobin A1C Latest Ref Rng & Units 05/11/2020 10/24/2019  HGBA1C 4.0 - 5.6 % 9.9(A) 8.2(A)  Some recent data might be hidden  Assessment:   1. Pain due to onychomycosis of toenails of both feet   2. Porokeratosis   3. Pes planus of both feet   4. Plantar fasciitis   5. Type 2 diabetes mellitus without complication, without long-term current use of insulin (HCC)    Plan:  Patient was evaluated and treated and all questions answered.  Onychomycosis with pain -Nails palliatively  debridement as below. -Educated on self-care  Procedure: Nail Debridement Rationale: Pain Type of Debridement: manual, sharp debridement. Instrumentation: Nail nipper, rotary burr. Number of Nails: 10  -Examined patient. -Continue diabetic foot care principles. -Plantar fasciitis controlled with custom foot orthotics. -Patient to continue soft, supportive shoe gear daily. -Toenails 1-5 b/l were debrided in length and girth with sterile nail nippers and dremel without iatrogenic bleeding.  -Painful porokeratotic lesion(s) submet head 2 right foot pared and enucleated with sterile scalpel blade without incident. -Patient to report any pedal injuries to medical professional immediately. -Patient/POA to call should there be question/concern in the interim.  Return in about 3 months (around 10/13/2020).  Freddie Breech, DPM

## 2020-07-22 ENCOUNTER — Other Ambulatory Visit: Payer: Self-pay | Admitting: Family Medicine

## 2020-07-27 ENCOUNTER — Ambulatory Visit: Payer: BC Managed Care – PPO | Admitting: Family Medicine

## 2020-08-24 ENCOUNTER — Other Ambulatory Visit: Payer: Self-pay | Admitting: Family Medicine

## 2020-08-24 NOTE — Telephone Encounter (Signed)
Called and scheduled patient to see another provider. She is not able to do wednesdays due to her work schedule.

## 2020-08-24 NOTE — Telephone Encounter (Signed)
Patient has been counciled on risks of long term use and has agreed to these risks. Refills provided per request.   Please have patient schedule follow up visit for diabetes follow up at earliest convenience

## 2020-08-24 NOTE — Telephone Encounter (Signed)
That is fine.   Thank you

## 2020-08-30 NOTE — Progress Notes (Signed)
    SUBJECTIVE:   CHIEF COMPLAINT / HPI:   T2DM: patient's last A1c 9.9% in November 2021. Patient currently prescribed Metformin XR 500mg  daily and Farxiga 10mg  daily. Her last BMP from November 2021 showed normal renal function. Repeat A1c today is 8.9%. She is having nausea with her medicine, she took only for 1 week in November. Even with the thought of taking it makes is nausea. She last took metformin/Farxiga in November 2021.  24-hour recall Woke up 6:30am Water at 6:30am Brkfst: 9:00am - [employer] breakfast - bacon, egg, cheese on croissant - 410 calories; water Lunch 1300 - iceberg salad, eggs, tomatoes, onions, with grilled chicken, 2 small crackers; drink diet Dr December Dinner 1800 - cheeseburger ([employer]), 3-5 french fries, diet December 2021 Dry Snack 2300 - skinny popcorn  HTN: prescribed olmesartan-HCTZ 40-12.5 daily. Her BP today is 114/68. Last BMP November 2021 showed normal renal function.  HLD: Crestor 40mg  daily. LDL improved from 100 in April 2021 to 74 in November 2021.   Health maintenance: patient is due for Colonoscopy, Pap smear, COVID booster, Eye exam, and mammogram   PERTINENT  PMH / PSH:  Patient Active Problem List   Diagnosis Date Noted  . Encounter for screening mammogram for malignant neoplasm of breast 09/01/2020  . Plantar fasciitis 11/15/2019  . Sciatica of right side 04/01/2019  . Allergic rhinitis due to pollen 12/03/2015  . Diabetes mellitus (HCC) 01/27/2011  . Hyperlipidemia associated with type 2 diabetes mellitus (HCC) 12/01/2006  . H/O sarcoidosis 08/31/2006  . Hypertension associated with diabetes (HCC) 08/31/2006    OBJECTIVE:   BP 114/68   Pulse 80   Ht 5\' 2"  (1.575 m)   Wt 161 lb 9.6 oz (73.3 kg)   SpO2 99%   BMI 29.56 kg/m    Physical exam: General: well-appearing, pleasant patient Respiratory: CTA bilaterally Cardio: RRR S1S2 present, no murmurs  ASSESSMENT/PLAN:   Hypertension associated with diabetes  (HCC) BP today 114/68. -Continue Olmesartan-HCTZ 40-12.5 daily  Hyperlipidemia associated with type 2 diabetes mellitus (HCC) Most recent LDL 74 on Crestor 40. ASCVD risk 7.8% -Crestor 40 refilled  Encounter for screening mammogram for malignant neoplasm of breast -Mammogram ordered -Patient given information for scheduling mammogram  Diabetes mellitus Patient noncompliant with Metformin, 12/03/2006, has not taken either since November 2021 as thinking about taking them makes her very nauseous. A1c today improved from 9.9% down to 8.9% with dietary/lifestyle modifications.  -Encouraged patient to continue dietary/lifestyle modifications -Asked her to at least take 09/02/2006 once daily -Follow up 3 months    , DO [facility]

## 2020-08-31 NOTE — Patient Instructions (Addendum)
Thank you for coming in to see [place] today! Please see below to review our plan for today's visit:  1. I recommend you take at least [place] once daily. Your A1c is improved, which is great, but still not at goal!  2. Continue your lifestyle modifications! 3. Health maintenance: you are due for many preventive medicine screens such as Pap smear, Colonoscopy, and mammogram (I have placed a referral for you - please call to schedule!).   Please call the clinic at [phone] if your symptoms worsen or you have any concerns. It was our pleasure to serve you!   Dr. Peggyann Shoals [facility]

## 2020-09-01 ENCOUNTER — Encounter: Payer: Self-pay | Admitting: Family Medicine

## 2020-09-01 ENCOUNTER — Other Ambulatory Visit: Payer: Self-pay

## 2020-09-01 ENCOUNTER — Ambulatory Visit: Payer: BC Managed Care – PPO | Admitting: Family Medicine

## 2020-09-01 VITALS — BP 114/68 | HR 80 | Ht 62.0 in | Wt 161.6 lb

## 2020-09-01 DIAGNOSIS — Z1231 Encounter for screening mammogram for malignant neoplasm of breast: Secondary | ICD-10-CM

## 2020-09-01 DIAGNOSIS — E1159 Type 2 diabetes mellitus with other circulatory complications: Secondary | ICD-10-CM

## 2020-09-01 DIAGNOSIS — E1169 Type 2 diabetes mellitus with other specified complication: Secondary | ICD-10-CM

## 2020-09-01 DIAGNOSIS — E119 Type 2 diabetes mellitus without complications: Secondary | ICD-10-CM | POA: Diagnosis not present

## 2020-09-01 DIAGNOSIS — E785 Hyperlipidemia, unspecified: Secondary | ICD-10-CM

## 2020-09-01 DIAGNOSIS — Z0000 Encounter for general adult medical examination without abnormal findings: Secondary | ICD-10-CM | POA: Insufficient documentation

## 2020-09-01 DIAGNOSIS — I152 Hypertension secondary to endocrine disorders: Secondary | ICD-10-CM

## 2020-09-01 HISTORY — DX: Encounter for screening mammogram for malignant neoplasm of breast: Z12.31

## 2020-09-01 LAB — POCT GLYCOSYLATED HEMOGLOBIN (HGB A1C): Hemoglobin A1C: 8.9 % — AB (ref 4.0–5.6)

## 2020-09-01 MED ORDER — BLOOD GLUCOSE MONITOR KIT: PACK | 0 refills | Status: AC

## 2020-09-01 MED ORDER — ROSUVASTATIN CALCIUM 40 MG PO TABS: 40.0000 mg | ORAL_TABLET | Freq: Every day | ORAL | 2 refills | Status: DC

## 2020-09-02 ENCOUNTER — Other Ambulatory Visit: Payer: Self-pay

## 2020-09-02 MED ORDER — ACCU-CHEK SOFTCLIX LANCETS MISC: 12 refills | Status: DC

## 2020-09-02 MED ORDER — ACCU-CHEK GUIDE VI STRP: ORAL_STRIP | 12 refills | Status: DC

## 2020-09-02 NOTE — Assessment & Plan Note (Signed)
BP today 114/68. -Continue Olmesartan-HCTZ 40-12.5 daily

## 2020-09-02 NOTE — Assessment & Plan Note (Signed)
-  Mammogram ordered -Patient given information for scheduling mammogram

## 2020-09-02 NOTE — Telephone Encounter (Signed)
Patient calls nurse line regarding glucometer and supplies. Patient states that the supplies need to be sent in separately per insurance.   Contacted pharmacist. Verified that strips and lancets need to be sent in separately and that accu-chek is covered by insurance.   Sent in supplies per protocol.    Veronda Prude, RN

## 2020-09-02 NOTE — Assessment & Plan Note (Signed)
Patient noncompliant with Metformin, Cheryl Carroll, has not taken either since November 2021 as thinking about taking them makes her very nauseous. A1c today improved from 9.9% down to 8.9% with dietary/lifestyle modifications.  -Encouraged patient to continue dietary/lifestyle modifications -Asked her to at least take [place] once daily -Follow up 3 months

## 2020-09-02 NOTE — Assessment & Plan Note (Signed)
Most recent LDL 74 on Crestor 40. ASCVD risk 7.8% -Crestor 40 refilled

## 2020-09-14 ENCOUNTER — Ambulatory Visit (HOSPITAL_BASED_OUTPATIENT_CLINIC_OR_DEPARTMENT_OTHER): Payer: BC Managed Care – PPO | Admitting: Radiology

## 2020-09-15 ENCOUNTER — Other Ambulatory Visit: Payer: Self-pay

## 2020-09-15 ENCOUNTER — Ambulatory Visit (HOSPITAL_BASED_OUTPATIENT_CLINIC_OR_DEPARTMENT_OTHER)
Admission: RE | Admit: 2020-09-15 | Discharge: 2020-09-15 | Disposition: A | Discharge status: Home / Self Care | Payer: BC Managed Care – PPO | Source: Ambulatory Visit | Attending: Family Medicine | Admitting: Family Medicine

## 2020-09-15 DIAGNOSIS — Z1231 Encounter for screening mammogram for malignant neoplasm of breast: Secondary | ICD-10-CM | POA: Diagnosis not present

## 2020-09-21 ENCOUNTER — Ambulatory Visit: Payer: BC Managed Care – PPO

## 2020-11-03 ENCOUNTER — Ambulatory Visit: Payer: BC Managed Care – PPO | Admitting: Podiatry

## 2020-11-05 ENCOUNTER — Other Ambulatory Visit: Payer: Self-pay

## 2020-11-08 MED ORDER — OLMESARTAN MEDOXOMIL-HCTZ 40-12.5 MG PO TABS: 1.0000 | ORAL_TABLET | Freq: Every day | ORAL | 0 refills | Status: DC

## 2020-11-26 DIAGNOSIS — Z20828 Contact with and (suspected) exposure to other viral communicable diseases: Secondary | ICD-10-CM | POA: Diagnosis not present

## 2021-01-25 ENCOUNTER — Other Ambulatory Visit: Payer: Self-pay

## 2021-01-25 ENCOUNTER — Ambulatory Visit: Payer: BC Managed Care – PPO | Admitting: Podiatry

## 2021-01-25 ENCOUNTER — Encounter: Payer: Self-pay | Admitting: Podiatry

## 2021-01-25 DIAGNOSIS — E119 Type 2 diabetes mellitus without complications: Secondary | ICD-10-CM

## 2021-01-25 DIAGNOSIS — M79674 Pain in right toe(s): Secondary | ICD-10-CM

## 2021-01-25 DIAGNOSIS — L84 Corns and callosities: Secondary | ICD-10-CM | POA: Diagnosis not present

## 2021-01-25 DIAGNOSIS — M79675 Pain in left toe(s): Secondary | ICD-10-CM | POA: Diagnosis not present

## 2021-01-25 DIAGNOSIS — B351 Tinea unguium: Secondary | ICD-10-CM

## 2021-01-29 NOTE — Progress Notes (Signed)
  Subjective:  Patient ID: Cheryl Carroll, female    DOB: [birth year],  MRN: [id number]  Cheryl Carroll presents to clinic today for preventative diabetic foot care and painful porokeratotic lesion(s) b/l feet and painful mycotic toenails that limit ambulation. Painful toenails interfere with ambulation. Aggravating factors include wearing enclosed shoe gear. Pain is relieved with periodic professional debridement. Painful porokeratotic lesions are aggravated when weightbearing with and without shoegear. Pain is relieved with periodic professional debridement.  Last A1c was 8.9%.   PCP is Maness, Loistine Chance, MD.  No Known Allergies  Review of Systems: Negative except as noted in the HPI. Objective:   Constitutional Cheryl Carroll is a pleasant 55 y.o. African American female, in NAD. AAO x 3.   Vascular Capillary refill time to digits immediate b/l. Palpable DP pulse(s) b/l lower extremities Palpable PT pulse(s) b/l lower extremities Pedal hair present. Lower extremity skin temperature gradient within normal limits. No pain with calf compression b/l. No cyanosis or clubbing noted.  Neurologic Normal speech. Oriented to person, place, and time. Protective sensation intact 5/5 intact bilaterally with 10g monofilament b/l. Vibratory sensation intact b/l.  Dermatologic Pedal skin with normal turgor, texture and tone b/l lower extremities. Toenails 1-5 b/l elongated, discolored, dystrophic, thickened, crumbly with subungual debris and tenderness to dorsal palpation. Hyperkeratotic lesion(s) L 5th toe.  No erythema, no edema, no drainage, no fluctuance. Minimal lesion submet head 2 right foot.  Orthopedic: Normal muscle strength 5/5 to all lower extremity muscle groups bilaterally. No pain crepitus or joint limitation noted with ROM b/l. Hallux valgus with bunion deformity noted b/l lower extremities. Hammertoe(s) noted to the 2-5 bilaterally.   Radiographs: None Assessment:   1. Pain due to  onychomycosis of toenails of both feet   2. Corns   3. Type 2 diabetes mellitus without complication, without long-term current use of insulin (HCC)    Plan:  -Examined patient. -Continue diabetic foot care principles. -Patient to continue soft, supportive shoe gear daily. -Toenails 1-5 b/l were debrided in length and girth with sterile nail nippers and dremel without iatrogenic bleeding.  -Corn(s) L 5th toe pared utilizing sterile scalpel blade without complication or incident. Total number debrided=1. -Patient to report any pedal injuries to medical professional immediately. -Patient/POA to call should there be question/concern in the interim.  Return in about 3 months (around 04/27/2021).  Freddie Breech, DPM

## 2021-03-17 ENCOUNTER — Other Ambulatory Visit: Payer: Self-pay | Admitting: Family Medicine

## 2021-03-30 ENCOUNTER — Ambulatory Visit (INDEPENDENT_AMBULATORY_CARE_PROVIDER_SITE_OTHER): Payer: BC Managed Care – PPO | Admitting: Family Medicine

## 2021-03-30 ENCOUNTER — Other Ambulatory Visit: Payer: Self-pay

## 2021-03-30 VITALS — BP 138/82 | HR 85 | Ht 62.0 in

## 2021-03-30 DIAGNOSIS — B349 Viral infection, unspecified: Secondary | ICD-10-CM

## 2021-03-30 NOTE — Progress Notes (Signed)
    SUBJECTIVE:   CHIEF COMPLAINT / HPI: Cough, rhinorrhea  She reports productive cough and rhinorrhea for over 2 weeks. Had chills for 1 day last week but no fevers. Has tried Mucinex, Robitussin. No sinus headaches.  Denies shortness of breath.  A few of her grandchildren have been sick.  She has had 2 negative COVID tests at work.  Denies smoking history.  PERTINENT  PMH / PSH: Allergic rhinitis, T2DM, HTN  OBJECTIVE:   BP 138/82   Pulse 85   Ht 5\' 2"  (1.575 m)   SpO2 100%   BMI 29.56 kg/m   General: Well-appearing middle-aged female, NAD HEENT: MMM, posterior oropharynx clear, no maxillary or frontal sinus tenderness CV: RRR, no murmurs Pulm: CTAB, no wheezes or rales  ASSESSMENT/PLAN:   Viral illness Low suspicion for bacterial infection.  Likely viral illness, possible acute bronchitis also likely viral in etiology.  Recommended continued supportive care, reassurance provided.  Return precautions given.   Advised to follow-up with PCP for chronic issues.  Wanted flu shot today but advised to receive this when she is feeling better.  , MD [facility]

## 2021-03-30 NOTE — Patient Instructions (Addendum)
It was nice seeing you today!  Your exam is reassuring. You likely have a viral infection that will take some more time to get better. Continue staying hydrated at home and continue using honey for cough.  Make a follow-up appointment with your PCP to check your diabetes and other issues.  Please arrive at least 15 minutes prior to your scheduled appointments.  Stay well, Cheryl Deeds, MD [facility] [phone]

## 2021-04-01 ENCOUNTER — Telehealth: Payer: Self-pay

## 2021-04-01 MED ORDER — BENZONATATE 200 MG PO CAPS: 200.0000 mg | ORAL_CAPSULE | Freq: Two times a day (BID) | ORAL | 0 refills | Status: DC | PRN

## 2021-04-01 NOTE — Telephone Encounter (Signed)
Patient calls nurse line regarding appointment on 9/27. Patient reports that she believes she has bacterial infection due to cough that has been going on for three weeks. Advised patient that per note from last visit, provider suspected that cough was related to viral infection.   Patient would like medication to help with cough. Attempted to schedule for virtual follow up, however, we do not have any appointments.   Veronda Prude, RN

## 2021-04-01 NOTE — Telephone Encounter (Signed)
No problem, I will send her benzonatate for cough

## 2021-04-27 ENCOUNTER — Ambulatory Visit: Payer: BC Managed Care – PPO | Admitting: Family Medicine

## 2021-04-27 ENCOUNTER — Other Ambulatory Visit: Payer: Self-pay

## 2021-04-27 VITALS — BP 140/90 | HR 112 | Ht 62.0 in | Wt 163.0 lb

## 2021-04-27 DIAGNOSIS — R519 Headache, unspecified: Secondary | ICD-10-CM

## 2021-04-27 MED ORDER — KETOROLAC TROMETHAMINE 30 MG/ML IJ SOLN
15.0000 mg | Freq: Once | INTRAMUSCULAR | Status: AC
  Administered 2021-04-27: 15 mg via INTRAMUSCULAR

## 2021-04-27 NOTE — Progress Notes (Addendum)
    SUBJECTIVE:   CHIEF COMPLAINT / HPI:   Headache: 55 year old female presenting for headache.  She states since Saturday she has had sinus pressure and a frontal headache which is bilateral. She denies fevers, vomiting, nausea, diarrhea. She has tried tylenol which didn't help it. Denies photophobia but states moving around makes it hurt worse. States it feels similar to previous headaches.  Denies blood in stool, black stools, no history of kidney disorders.  PERTINENT  PMH / PSH: History of sinus headaches  OBJECTIVE:   BP 140/90   Pulse (!) 112   Ht 5\' 2"  (1.575 m)   Wt 163 lb (73.9 kg)   BMI 29.81 kg/m    Heart rate recheck 96  General: NAD, pleasant, able to participate in exam HEENT: Attempted ophthalmologic exam with no obvious sign of papilledema though exam was of poor quality Cardiac: RRR, no murmurs. Respiratory: CTAB, normal effort, No wheezes, rales or rhonchi Skin: warm and dry, no rashes noted Neuro: alert, CN II through XII intact, fine touch sensation intact in upper and lower extremities bilaterally, strength 5/5 in upper and lower extremities bilaterally. Psych: Normal affect and mood  ASSESSMENT/PLAN:   Sinus headache: 55 year old female with history of sinus headaches presenting with symptoms suggestive of sinus headache since Saturday.  She has no red flag symptoms, no fevers, neurologic findings, nausea or vomiting.  The headache is frontal and bilateral around the areas of her sinuses.  It feels like her previous headaches.  Unlikely migraine with presentation of the headache and the lack of nausea, photophobia, and her history of sinus headaches.  We will give her 1 dose of Toradol IM.  Most recent creatinine within normal limits, no blood in stool or black stools and patient has not been using NSAIDs lately.  We will have her continue with acetaminophen every 6 hours and additional ibuprofen later this evening if needed.  When I have her take more than a  few days of NSAIDs and return if symptoms worsen or do not improve.   Sunday, DO [facility]

## 2021-04-27 NOTE — Patient Instructions (Signed)
Today we gave you an injection for pain reliever for the headache.  I would like for you to take Tylenol when you get home and continue taking it every 6 hours for the next 24 hours.  At bedtime tonight you can also take some ibuprofen if you need and can take that every 6 hours starting at bedtime tonight.  If your headache does not go away by tomorrow afternoon please let us know.  If you get any other concerning symptoms or your headache changes please let us know.

## 2021-04-28 ENCOUNTER — Ambulatory Visit: Payer: BC Managed Care – PPO | Admitting: Podiatry

## 2021-07-06 IMAGING — MR MR LUMBAR SPINE W/O CM
4 of 5 series · 26 of 48 positions shown · non-contrast
Comparison: Lumbar spine radiographs 01/22/2019

CLINICAL DATA: Neck pain. Greater than 6 weeks of conservative
therapy. Pain extending into the right buttocks into the right foot.
Pain for greater than 1 year with recent progression.

EXAM:
MRI LUMBAR SPINE WITHOUT CONTRAST
TECHNIQUE: Multiplanar, multisequence MR imaging of the lumbar spine was
performed. No intravenous contrast was administered.

[Series 5: T2 · sagittal · 4.0mm · 0.73mm/px · 6 of 16 slices shown (1 of 2)]
[im 1/16]
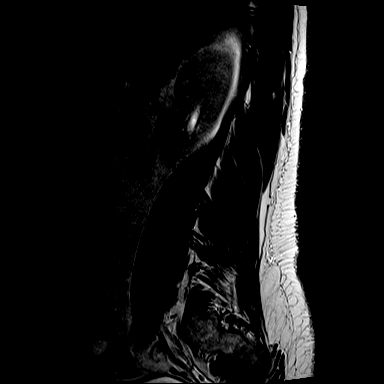
[im 4/16]
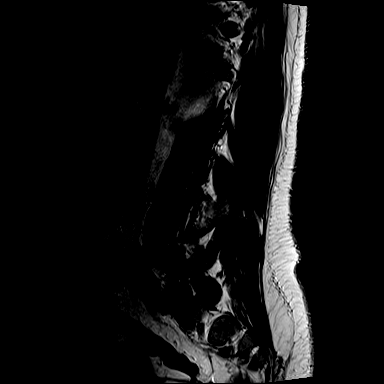
[im 7/16]
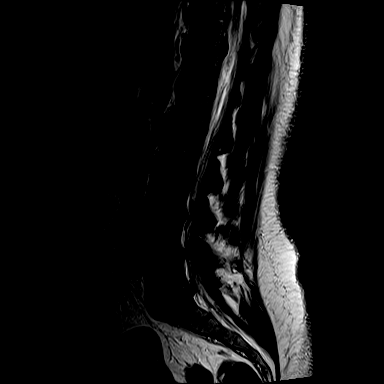
[im 10/16]
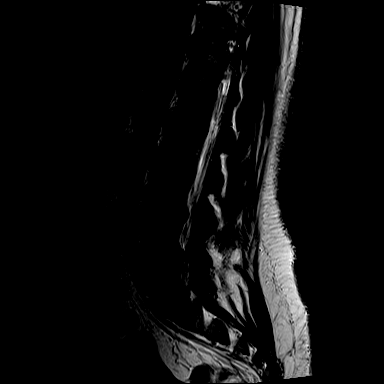
[im 13/16]
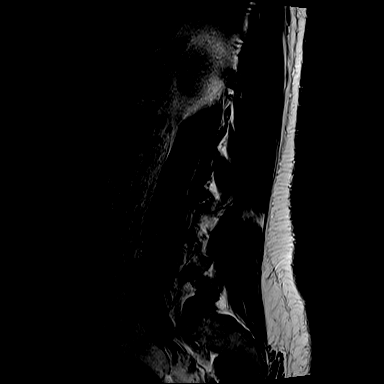
[im 16/16]
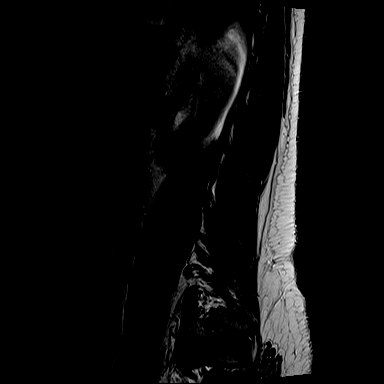

[Series 7: T1 · sagittal · 4.0mm · 0.88mm/px · 7 of 16 slices shown (1 of 2)]
[im 1/16]
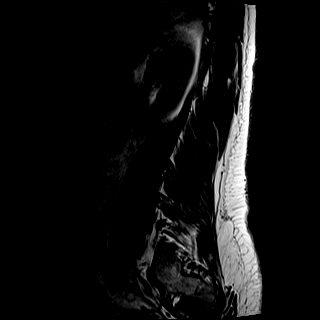
[im 3/16]
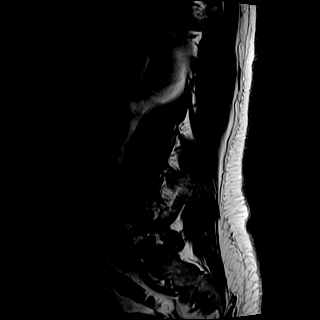
[im 6/16]
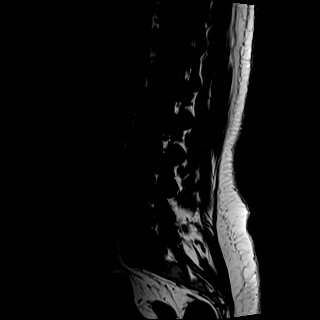
[im 8/16]
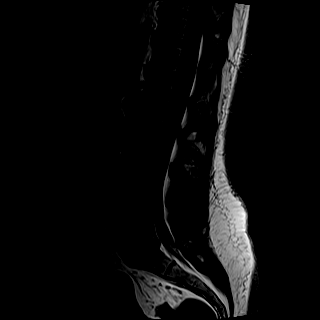
[im 11/16]
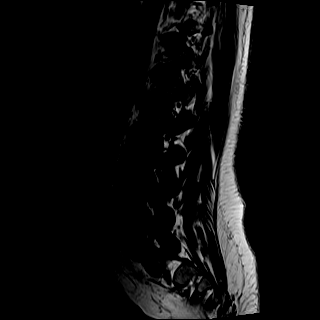
[im 13/16]
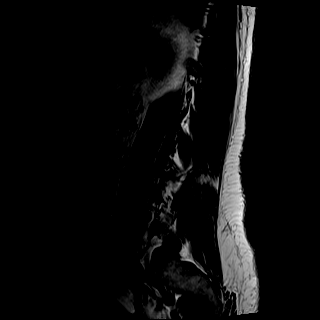
[im 16/16]
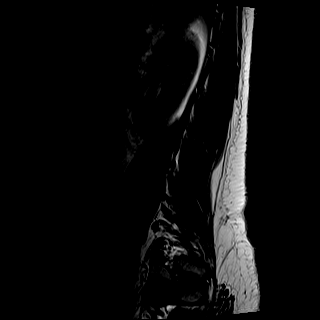

[Series 8: T2 · axial · 4.0mm · 0.57mm/px · z∈[-125,+63]mm · 8 of 34 slices shown (2 of 2)]
[im 1/34]
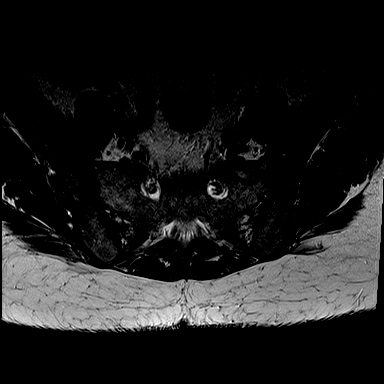
[im 6/34]
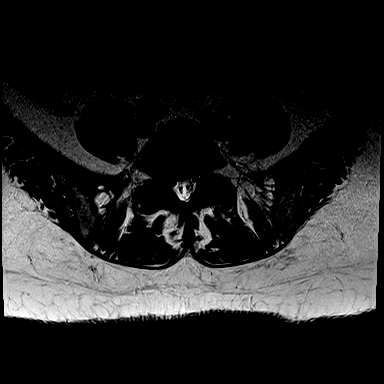
[im 11/34]
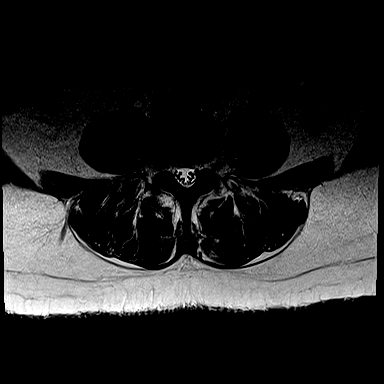
[im 16/34]
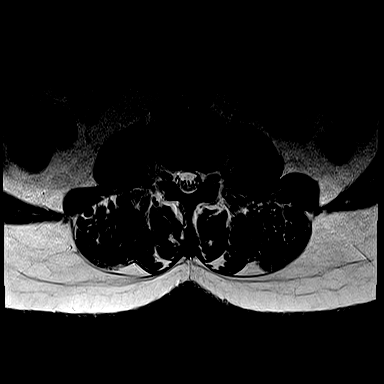
[im 18/34]
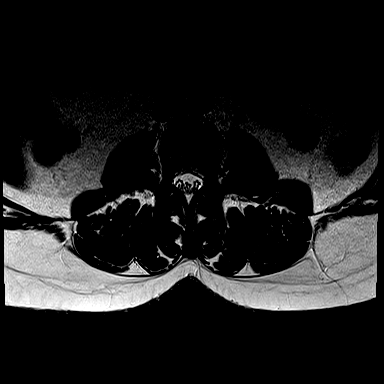
[im 23/34]
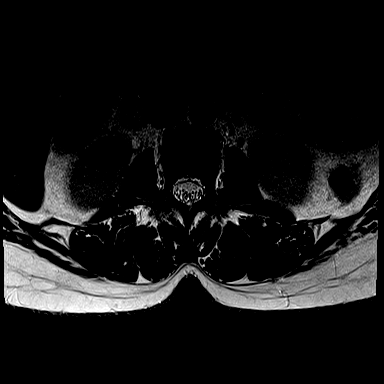
[im 28/34]
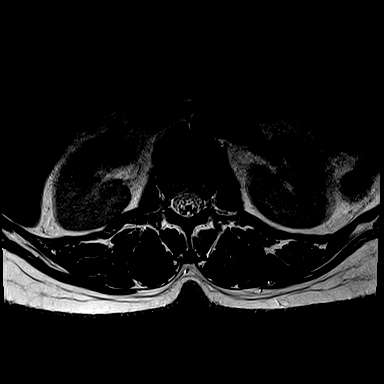
[im 34/34]
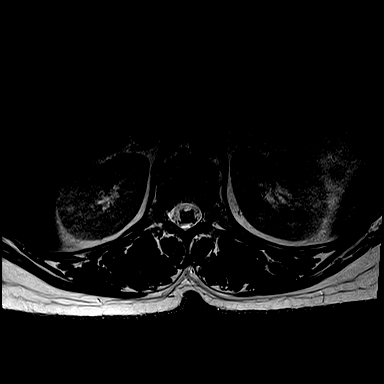

[Series 9: T1 · axial · 4.0mm · 0.34mm/px · z∈[-125,+33]mm · 5 of 34 slices shown (2 of 2)]
[im 1/34]
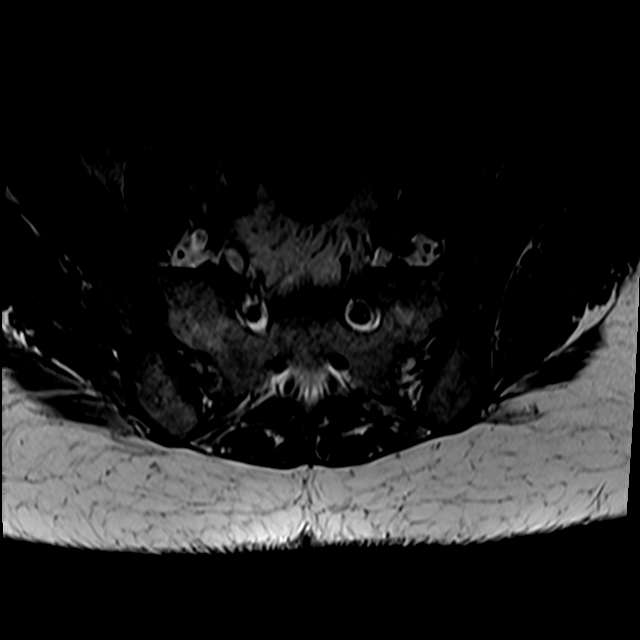
[im 6/34]
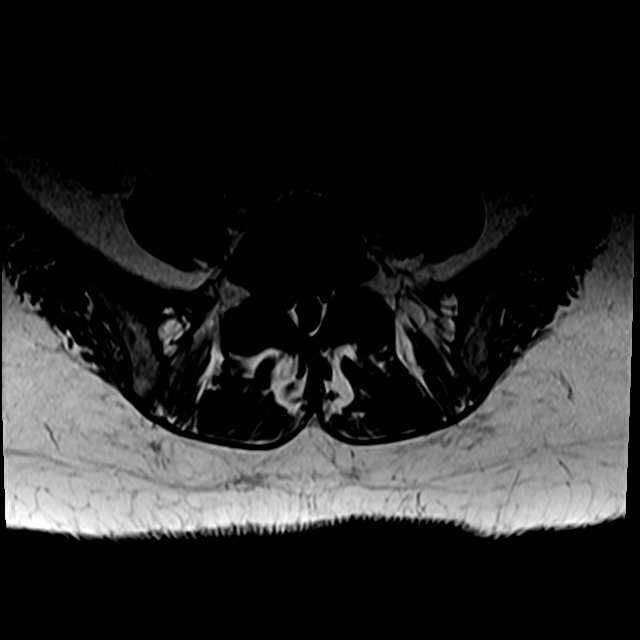
[im 11/34]
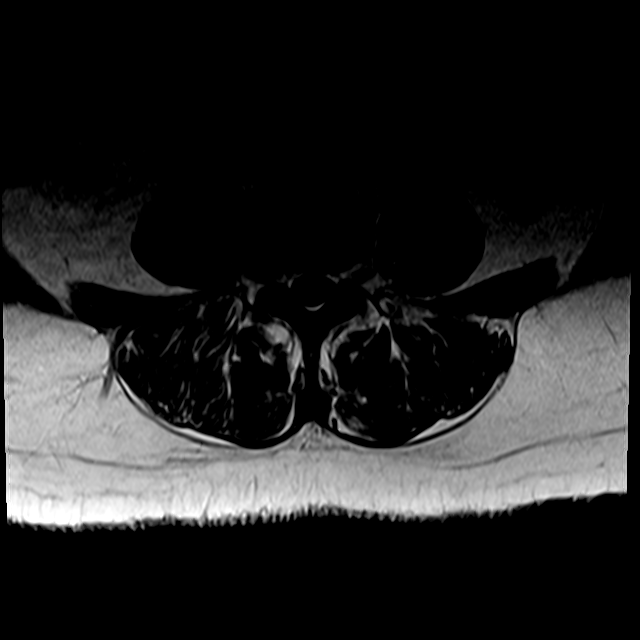
[im 18/34]
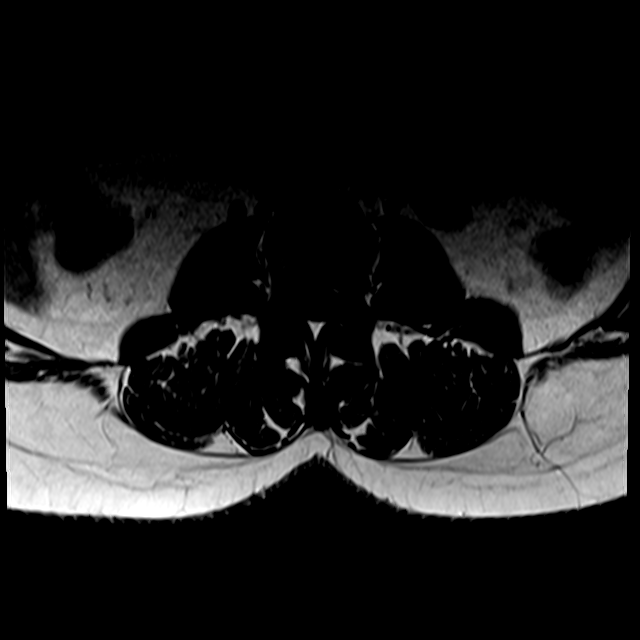
[im 28/34]
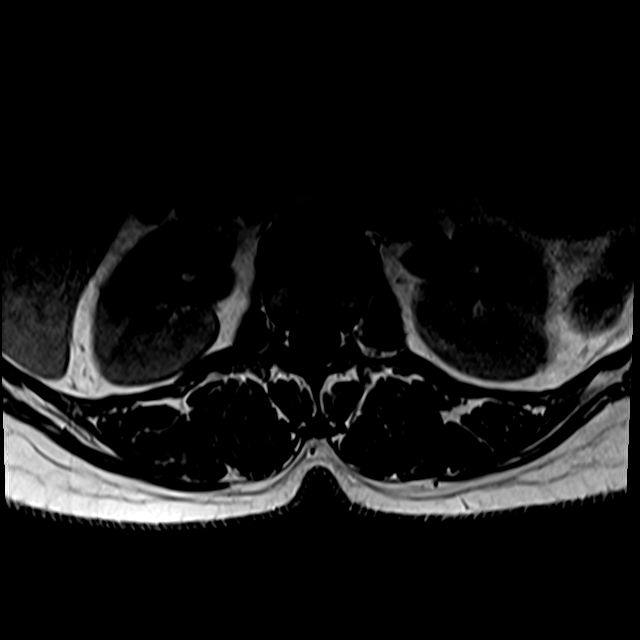

[26 of 48 positions shown; findings below may reference images not displayed]

FINDINGS: Segmentation: 5 non rib-bearing lumbar type vertebral bodies are
present. The lowest fully formed vertebral body is L5.

Alignment: Minimal anterolisthesis is present at L4-5. Slight
retrolisthesis is present at L3-4. Mild leftward curvature is
present at L3.

Vertebrae:  Marrow signal and vertebral body heights are normal.

Conus medullaris and cauda equina: Conus extends to the L1 level.
Conus and cauda equina appear normal.

Paraspinal and other soft tissues: Limited imaging the abdomen is
unremarkable. There is no significant adenopathy. No solid organ
lesions are present.

Disc levels:

L1-2: Negative.

L2-3: Negative.

L3-4: A broad-based disc protrusion is present. A far left annular
tear is noted. There is potential contact of the exiting left L3
nerve root. The central canal is patent. Moderate facet hypertrophy
is worse on the left.

L4-5: A broad-based disc protrusion is asymmetric to the right.
Moderate facet hypertrophy is noted. There is mild narrowing of the
central canal. Disc extends into the right foramen with mild right
foraminal stenosis.

L5-S1: A rightward disc protrusion and annular tear is present. This
impacts the right S1 and L5 nerve roots. Asymmetric right-sided
facet hypertrophy is noted as well.
IMPRESSION: 1. Rightward disc protrusion and annular tear contacting the right
L5 and S1 nerve roots. Mild subarticular and foraminal stenosis is
present. This corresponds with the patient's symptoms.
2. Mild right foraminal narrowing at L4-5.
3. Possible contact of the exiting left L3 nerve root at L3-4
secondary to a far left lateral disc protrusion and annular tear.

## 2021-07-23 ENCOUNTER — Emergency Department (HOSPITAL_COMMUNITY): Payer: BC Managed Care – PPO

## 2021-07-23 ENCOUNTER — Other Ambulatory Visit: Payer: Self-pay

## 2021-07-23 ENCOUNTER — Encounter (HOSPITAL_COMMUNITY): Payer: Self-pay | Admitting: Emergency Medicine

## 2021-07-23 ENCOUNTER — Emergency Department (HOSPITAL_COMMUNITY)
Admission: EM | Admit: 2021-07-23 | Discharge: 2021-07-24 | Disposition: A | Discharge status: Home / Self Care | Payer: BC Managed Care – PPO | Attending: Emergency Medicine | Admitting: Emergency Medicine

## 2021-07-23 DIAGNOSIS — M79601 Pain in right arm: Secondary | ICD-10-CM | POA: Insufficient documentation

## 2021-07-23 DIAGNOSIS — R42 Dizziness and giddiness: Secondary | ICD-10-CM | POA: Diagnosis not present

## 2021-07-23 DIAGNOSIS — R079 Chest pain, unspecified: Secondary | ICD-10-CM | POA: Diagnosis not present

## 2021-07-23 DIAGNOSIS — R9431 Abnormal electrocardiogram [ECG] [EKG]: Secondary | ICD-10-CM | POA: Diagnosis not present

## 2021-07-23 DIAGNOSIS — E119 Type 2 diabetes mellitus without complications: Secondary | ICD-10-CM | POA: Diagnosis not present

## 2021-07-23 DIAGNOSIS — I10 Essential (primary) hypertension: Secondary | ICD-10-CM | POA: Insufficient documentation

## 2021-07-23 LAB — COMPREHENSIVE METABOLIC PANEL
ALT: 19 U/L (ref 0–44)
AST: 19 U/L (ref 15–41)
Albumin: 4.4 g/dL (ref 3.5–5.0)
Alkaline Phosphatase: 56 U/L (ref 38–126)
Anion gap: 9 (ref 5–15)
BUN: 15 mg/dL (ref 6–20)
CO2: 27 mmol/L (ref 22–32)
Calcium: 9.8 mg/dL (ref 8.9–10.3)
Chloride: 99 mmol/L (ref 98–111)
Creatinine, Ser: 1.02 mg/dL — ABNORMAL HIGH (ref 0.44–1.00)
GFR, Estimated: >60 mL/min (ref >60)
Glucose, Bld: 278 mg/dL — ABNORMAL HIGH (ref 70–99)
Potassium: 3.8 mmol/L (ref 3.5–5.1)
Sodium: 135 mmol/L (ref 135–145)
Total Bilirubin: 0.7 mg/dL (ref 0.3–1.2)
Total Protein: 7.7 g/dL (ref 6.5–8.1)

## 2021-07-23 LAB — CBC WITH DIFFERENTIAL/PLATELET
Abs Immature Granulocytes: 0.02 10*3/uL (ref 0.00–0.07)
Basophils Absolute: 0 10*3/uL (ref 0.0–0.1)
Basophils Relative: 1 %
Eosinophils Absolute: 0.2 10*3/uL (ref 0.0–0.5)
Eosinophils Relative: 3 %
HCT: 41.1 % (ref 36.0–46.0)
Hemoglobin: 13.3 g/dL (ref 12.0–15.0)
Immature Granulocytes: 0 %
Lymphocytes Relative: 31 %
Lymphs Abs: 1.7 10*3/uL (ref 0.7–4.0)
MCH: 26.1 pg (ref 26.0–34.0)
MCHC: 32.4 g/dL (ref 30.0–36.0)
MCV: 80.6 fL (ref 80.0–100.0)
Monocytes Absolute: 0.3 10*3/uL (ref 0.1–1.0)
Monocytes Relative: 6 %
Neutro Abs: 3.2 10*3/uL (ref 1.7–7.7)
Neutrophils Relative %: 59 %
Platelets: 324 10*3/uL (ref 150–400)
RBC: 5.1 MIL/uL (ref 3.87–5.11)
RDW: 14.3 % (ref 11.5–15.5)
WBC: 5.5 10*3/uL (ref 4.0–10.5)
nRBC: 0 % (ref 0.0–0.2)

## 2021-07-23 LAB — URINALYSIS, ROUTINE W REFLEX MICROSCOPIC
Bilirubin Urine: NEGATIVE
Glucose, UA: >=500 mg/dL — AB
Hgb urine dipstick: NEGATIVE
Ketones, ur: NEGATIVE mg/dL
Leukocytes,Ua: NEGATIVE
Nitrite: NEGATIVE
Protein, ur: NEGATIVE mg/dL
Specific Gravity, Urine: 1.011 (ref 1.005–1.030)
pH: 5 (ref 5.0–8.0)

## 2021-07-23 LAB — TROPONIN I (HIGH SENSITIVITY): Troponin I (High Sensitivity): 4 ng/L (ref <18)

## 2021-07-23 NOTE — ED Provider Triage Note (Signed)
Emergency Medicine Provider Triage Evaluation Note  Cheryl Carroll , a 56 y.o. female  was evaluated in triage.  Pt complains of dizziness.  States her symptoms started around 8 PM while walking to the kitchen.  She lowered her self to the floor.  Denies any falls.  States her symptoms stopped shortly thereafter.  Denies a history of similar symptoms.  Also notes that for the past day she has been experiencing waxing and waning right arm and anterior chest pain.  Lastly, patient notes low back pain.  Physical Exam  BP (!) 151/76 (BP Location: Right Arm)    Pulse 82    Temp 98.1 F (36.7 C) (Oral)    Resp 16    SpO2 98%  Gen:   Awake, no distress   Resp:  Normal effort  MSK:   Moves extremities without difficulty  Other:    Medical Decision Making  Medically screening exam initiated at 10:13 PM.  Appropriate orders placed.  Cheryl Carroll was informed that the remainder of the evaluation will be completed by another provider, this initial triage assessment does not replace that evaluation, and the importance of remaining in the ED until their evaluation is complete.   Rayna Sexton, PA-C 07/23/21 2214

## 2021-07-23 NOTE — ED Triage Notes (Signed)
Patient reports multiple complaints: Left upper chest pain radiating to right arm with mild SOB onset this evening , low back pain with diaphoresis .

## 2021-07-24 DIAGNOSIS — R9431 Abnormal electrocardiogram [ECG] [EKG]: Secondary | ICD-10-CM | POA: Diagnosis not present

## 2021-07-24 DIAGNOSIS — R42 Dizziness and giddiness: Secondary | ICD-10-CM | POA: Diagnosis not present

## 2021-07-24 LAB — TROPONIN I (HIGH SENSITIVITY): Troponin I (High Sensitivity): 3 ng/L (ref <18)

## 2021-07-24 MED ORDER — MECLIZINE HCL 25 MG PO TABS: 25.0000 mg | ORAL_TABLET | Freq: Three times a day (TID) | ORAL | 0 refills | Status: DC | PRN

## 2021-07-24 NOTE — ED Notes (Signed)
Pt ambulatory to room.   Pt states that her pain in left upper chest, under collar bone, with back pain. No known injury. Pt has not had another episode of dizziness or nausea since earlier today. Family member at bedside.

## 2021-07-24 NOTE — ED Provider Notes (Signed)
[facility] Emergency Department Provider Note MRN:  [id number]  Arrival date & time: 07/24/21     Chief Complaint   Dizziness History of Present Illness   Cheryl Carroll is a 56 y.o. year-old female with a history of hypertension, diabetes presenting to the ED with chief complaint of dizziness.  Patient was bending down to change the trash bag in the trash bin when she suddenly experienced a dizziness described as a room spinning sensation.  This was followed quickly by nausea.  She laid down and slowly the symptoms started going away.  She also experienced some discomfort in her right arm radiating into her chest.  This also resolved with time.  She currently feels back to normal.  Denies numbness or weakness to the arms or legs.  Review of Systems  A thorough review of systems was obtained and all systems are negative except as noted in the HPI and PMH.   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   Diabetes mellitus    Hyperlipidemia    Hypertension    Sarcoidosis of lung (HCC)     Past Surgical History:  Procedure Laterality Date   TUBAL LIGATION      Family History  Problem Relation Age of Onset   Diabetes Mother    Hyperlipidemia Mother    Hypertension Mother    Cancer Mother 68       Breast cancer   Hyperlipidemia Father    Hypertension Father    Diabetes Father     Social History   Socioeconomic History   Marital status: Single    Spouse name: Not on file   Number of children: Not on file   Years of education: Not on file   Highest education level: Not on file  Occupational History   Not on file  Tobacco Use   Smoking status: Never   Smokeless tobacco: Never  Vaping Use   Vaping Use: Never used  Substance and Sexual Activity   Alcohol use: No   Drug use: No   Sexual activity: Not Currently    Birth control/protection: Surgical  Other Topics Concern   Not on file   Social History Narrative   Not on file   Social Determinants of Health   Financial Resource Strain: Not on file  Food Insecurity: Not on file  Transportation Needs: Not on file  Physical Activity: Not on file  Stress: Not on file  Social Connections: Not on file  Intimate Partner Violence: Not on file     Physical Exam   Vitals:   07/24/21 0100 07/24/21 0115  BP: 119/66 130/72  Pulse: 91 92  Resp: 13 12  Temp:    SpO2: 99% 99%    CONSTITUTIONAL: Well-appearing, NAD NEURO/PSYCH:  Alert and oriented x 3, normal and symmetric strength and sensation, normal coordination, normal speech EYES:  eyes equal and reactive, normal extraocular movements, no nystagmus ENT/NECK:  no LAD, no JVD CARDIO: Regular rate, well-perfused, normal S1 and S2 PULM:  CTAB no wheezing or rhonchi GI/GU:  non-distended, non-tender MSK/SPINE:  No gross deformities, no edema SKIN:  no rash, atraumatic   *Additional and/or pertinent findings included in MDM below  Diagnostic and Interventional Summary    EKG Interpretation  Date/Time:  Friday July 23 2021 23:29:48 EST Ventricular Rate:  84 PR Interval:  154 QRS Duration: 76 QT Interval:  352 QTC Calculation: 415 R Axis:  53 Text Interpretation: Normal sinus rhythm Normal ECG When compared with ECG of 11-Mar-2018 23:33, PREVIOUS ECG IS PRESENT No significant change was found Confirmed by Kennis Carina [phone]) on 07/24/2021 2:37:07 AM       Labs Reviewed  COMPREHENSIVE METABOLIC PANEL - Abnormal; Notable for the following components:      Result Value   Glucose, Bld 278 (*)    Creatinine, Ser 1.02 (*)    All other components within normal limits  URINALYSIS, ROUTINE W REFLEX MICROSCOPIC - Abnormal; Notable for the following components:   Color, Urine STRAW (*)    Glucose, UA >=500 (*)    Bacteria, UA RARE (*)    All other components within normal limits  CBC WITH DIFFERENTIAL/PLATELET  TROPONIN I (HIGH SENSITIVITY)  TROPONIN I  (HIGH SENSITIVITY)    DG Chest 2 View  Final Result    CT HEAD WO CONTRAST ( )  Final Result      Medications - No data to display   Procedures  /  Critical Care Procedures  ED Course and Medical Decision Making  Initial Impression and Ddx Suspect benign vertigo episode, other considerations include central vertigo, ACS.  However patient's neurological exam is very reassuring.  Will need EKG and troponin evaluation for the chest pain.  No evidence of DVT on exam, no tachycardia, no hypoxia, doubt PE.  Past medical/surgical history that increases complexity of ED encounter: Diabetes  Interpretation of Diagnostics I personally reviewed the EKG and my interpretation is as follows: Sinus rhythm without ischemic findings    Troponin is negative, CT head is without acute process, remainder of basic labs are largely within normal limits.  Patient Reassessment and Ultimate Disposition/Management Patient looks and feels well, neurological exam remains reassuring, appropriate for discharge.  Patient management required discussion with the following services or consulting groups:  None  Complexity of Problems Addressed Acute complicated illness or Injury  Additional Data Reviewed and Analyzed Further history obtained from: Further history from spouse/family member and Past medical history and medications listed in the EMR  Factors Impacting ED Encounter Risk Prescriptions  Elmer Sow. Pilar Plate, MD [facility] [email or web address]  Final Clinical Impressions(s) / ED Diagnoses     ICD-10-CM   1. Vertigo  R42       ED Discharge Orders          Ordered    meclizine (ANTIVERT) 25 MG tablet  3 times daily PRN        07/24/21 0302             Discharge Instructions Discussed with and Provided to Patient:    Discharge Instructions      You were evaluated in the Emergency Department and after careful evaluation, we did not  find any emergent condition requiring admission or further testing in the hospital.  Your exam/testing today was overall reassuring.  Symptoms likely due to an episode of vertigo.  Can use the meclizine medication as needed for similar brief episodes.  Please return to the Emergency Department if you experience any worsening of your condition such as vertigo that walk away or numbness or weakness in the arms or legs..  Thank you for allowing [place] to be a part of your care.       Sabas Sous, MD 07/24/21 [phone]

## 2021-07-24 NOTE — Discharge Instructions (Addendum)
You were evaluated in the Emergency Department and after careful evaluation, we did not find any emergent condition requiring admission or further testing in the hospital.  Your exam/testing today was overall reassuring.  Symptoms likely due to an episode of vertigo.  Can use the meclizine medication as needed for similar brief episodes.  Please return to the Emergency Department if you experience any worsening of your condition such as vertigo that walk away or numbness or weakness in the arms or legs..  Thank you for allowing [place] to be a part of your care.

## 2021-07-24 NOTE — ED Notes (Signed)
Patient verbalizes understanding of discharge instructions. Prescriptions reviewed. Opportunity for questioning and answers were provided. Armband removed by staff, pt discharged from ED ambulatory. ° °

## 2021-07-26 ENCOUNTER — Other Ambulatory Visit: Payer: Self-pay

## 2021-07-26 ENCOUNTER — Ambulatory Visit: Payer: BC Managed Care – PPO | Admitting: Family Medicine

## 2021-07-26 VITALS — BP 134/65 | HR 90 | Wt 165.4 lb

## 2021-07-26 DIAGNOSIS — E119 Type 2 diabetes mellitus without complications: Secondary | ICD-10-CM | POA: Diagnosis not present

## 2021-07-26 DIAGNOSIS — H8110 Benign paroxysmal vertigo, unspecified ear: Secondary | ICD-10-CM

## 2021-07-26 DIAGNOSIS — E785 Hyperlipidemia, unspecified: Secondary | ICD-10-CM

## 2021-07-26 DIAGNOSIS — E1169 Type 2 diabetes mellitus with other specified complication: Secondary | ICD-10-CM | POA: Diagnosis not present

## 2021-07-26 LAB — POCT GLYCOSYLATED HEMOGLOBIN (HGB A1C): HbA1c, POC (controlled diabetic range): 10 % — AB (ref 0.0–7.0)

## 2021-07-26 MED ORDER — METFORMIN HCL ER 500 MG PO TB24: 500.0000 mg | ORAL_TABLET | Freq: Every day | ORAL | 1 refills | Status: DC

## 2021-07-26 MED ORDER — ROSUVASTATIN CALCIUM 40 MG PO TABS: 40.0000 mg | ORAL_TABLET | Freq: Every day | ORAL | 2 refills | Status: DC

## 2021-07-26 NOTE — Patient Instructions (Addendum)
I think your vertigo symptoms are most consistent with BPPV.  I am providing some resources that she can read on if you would like.  If you have further issues with this please let me know and we can always consider an neurovestibular rehab.  Your A1c is elevated at 10.0.  I provided a refill of your metformin.  I would like for you to start with 500 mg of metformin in the morning, if you tolerate this well increase to 500 in the morning and 500 in the evening.  If you continue to tolerate this well I will further increase to 1000 in the morning and 500 in the evening followed by 1000 in the morning and 1000 in the evening is the highest dose.  If you develop any abdominal discomfort or diarrhea you can scale back to the previous dose.  As we discussed we can always consider starting insulin or a GLP medication.  I recommend holding off on your Farxiga at this time until we get your metformin on board as it can cause complications.  I would like for you to follow-up with your primary doctor in about a week.  I recommend you schedule follow-up appointment with your primary care doctor in the next few weeks as you are due for multiple health maintenance items.

## 2021-07-26 NOTE — Progress Notes (Signed)
° ° °  SUBJECTIVE:   CHIEF COMPLAINT / HPI:   Dizziness-follow-up: 56 year old female presenting for follow-up after being seen in the emergency department on 07/23/2021 with complaint of dizziness.  In the ED it was suspected this was a benign vertigo episode as patient had a normal neurologic exam.  Troponins and EKG were unremarkable.  CT head was performed without any acute process and her labs were reassuring.  Was recommended to trial meclizine at home. She states that each time her symptoms occur it feels as if the room is spinning. She states the meclizine didn't help when she tried it. She states it only happened once in the last week.  T2DM: She denies taking farxiga or metformin.  States she was having some abdominal discomfort for metformin and with the [place].  PERTINENT  PMH / PSH: T2DM  OBJECTIVE:   BP 134/65    Pulse 90    Wt 165 lb 6.4 oz (75 kg)    SpO2 100%    BMI 30.25 kg/m    General: NAD, pleasant, able to participate in exam Respiratory: No respiratory distress Neuro: alert, no obvious focal deficits, CN II through XII intact, fine touch sensation intact in upper and lower extremities bilaterally, strength 5/5 in upper and lower extremities bilaterally Psych: Normal affect and mood  ASSESSMENT/PLAN:   Diabetes mellitus A1c elevated 10.0.  Patient is not been taking her metformin or [place].  I discussed multiple options we can try including initiating insulin and doing teaching via pharmacy versus restarting the metformin versus considering another medication modality such as GLP.  The patient at this time states she wants to retry metformin.  I discussed titrating this starting with 500 mg daily and slowly increasing to a maximum dose of 1000 mg twice daily.  I will provide a refill for this.  She is going to follow-up with her PCP in a week or 2 to discuss her other health maintenance items and to see how she is doing with metformin.   BPPV: Patient with a single  bout of vertigo that she describes as "the room spinning".  She went to the ED and had a work-up including head CT and troponins which were negative.  She is not had any further bouts of this.  I discussed with her that neurovestibular rehab can be of assistance if this becomes a regular thing but if it is an isolated incident there is no set item we can do to help reduce the recurrence of this.  She is going let me know if she has any further bouts and we can set up neurovestibular rehab at that time if this were to occur.  Discussed red flag symptoms.  Jackelyn Poling, DO [facility]

## 2021-07-26 NOTE — Assessment & Plan Note (Signed)
A1c elevated 10.0.  Patient is not been taking her metformin or [place].  I discussed multiple options we can try including initiating insulin and doing teaching via pharmacy versus restarting the metformin versus considering another medication modality such as GLP.  The patient at this time states she wants to retry metformin.  I discussed titrating this starting with 500 mg daily and slowly increasing to a maximum dose of 1000 mg twice daily.  I will provide a refill for this.  She is going to follow-up with her PCP in a week or 2 to discuss her other health maintenance items and to see how she is doing with metformin.

## 2021-07-27 ENCOUNTER — Telehealth: Payer: Self-pay | Admitting: *Deleted

## 2021-07-27 ENCOUNTER — Other Ambulatory Visit: Payer: Self-pay | Admitting: Family Medicine

## 2021-07-27 DIAGNOSIS — E119 Type 2 diabetes mellitus without complications: Secondary | ICD-10-CM

## 2021-07-27 MED ORDER — METFORMIN HCL ER 500 MG PO TB24: 500.0000 mg | ORAL_TABLET | Freq: Every day | ORAL | 1 refills | Status: DC

## 2021-07-27 NOTE — Telephone Encounter (Signed)
Received a fax from pharmacy for a 90 day supply for the metformin Rx. Routing to doctor who prescribed.Cheryl Carroll, CMA

## 2021-08-04 ENCOUNTER — Ambulatory Visit: Payer: BC Managed Care – PPO | Admitting: Podiatry

## 2021-08-23 ENCOUNTER — Other Ambulatory Visit: Payer: Self-pay | Admitting: Family Medicine

## 2021-08-23 DIAGNOSIS — E119 Type 2 diabetes mellitus without complications: Secondary | ICD-10-CM

## 2021-09-07 ENCOUNTER — Other Ambulatory Visit: Payer: Self-pay | Admitting: Family Medicine

## 2021-09-29 ENCOUNTER — Ambulatory Visit: Payer: BC Managed Care – PPO | Admitting: Podiatry

## 2021-10-12 ENCOUNTER — Ambulatory Visit: Payer: BC Managed Care – PPO | Admitting: Podiatry

## 2021-10-28 ENCOUNTER — Telehealth: Payer: Self-pay

## 2021-10-28 NOTE — Telephone Encounter (Signed)
Patient calls nurse line requesting a refill on Meloxicam.  ? ?Patient reports she looked at her current bottle and realized the medication expired on 4/20. ? ?Patient reports she takes this for lower back pain when she is working.  ? ?Patient scheduled for tomorrow with PCP for evaluation.  ? ?Patient would like refill sent in today.  ?

## 2021-10-28 NOTE — Telephone Encounter (Signed)
Given that last creatinine in January was elevated, cannot prescribe meloxicam or any NSAID until I recheck her kidney function and see creatinine either stable or returned to normal. Will discuss with patient at appointment tomorrow. ? ?Fayette Pho, MD ? ?

## 2021-10-28 NOTE — Progress Notes (Signed)
? ? ? ?SUBJECTIVE:  ? ?CHIEF COMPLAINT / HPI:  ? ?Back pain, radiating down legs ?Pain since 2009, bulging disc in neck and low back ?Wax and wane ?Pain worse since starting new job, works in [employer], is on feet ?Pain every day ?Worsening: Walking too long, sitting too long, lying too long ?Improve: meloxicam, tylenol, advil ? 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 ?T2DM ?Metformin only, taking only 500 mg XR tablet daily ?Not taking Marcelline Deist because "it tastes like metformin" ?Makes her nauseous even with food ?Notes difficulty swallowing ?Patient adamant about not taking insulin, adverse to needles ?Eating better walking 1-2 miles a day ?UTD on ophtho exam, just went in Feb or March ? ?Lab Results  ?Component Value Date  ? HGBA1C 9.6 (A) 10/29/2021  ? HGBA1C 10.0 (A) 07/26/2021  ? HGBA1C 8.9 (A) 09/01/2020  ? ?Lab Results  ?Component Value Date  ? MICROALBUR 10.2 07/24/2018  ? LDLCALC 100 (H) 10/24/2019  ? CREATININE 0.83 10/29/2021  ? ?HLD ?Taking rosuvastatin 40 mg daily ?Just started back in the last couple of months ? ?HTN ?Currently on olmesartan-HCTZ 40-12.5 mg daily ?No lapses in adherence ?BP Readings from Last 3 Encounters:  ?10/29/21 132/83  ?07/26/21 134/65  ?07/24/21 126/86  ?  ? ?PERTINENT  PMH / PSH: plantar fasciitis, right sided sciatica, T2DM, HLD, sarcoidosis, HTN ? ?OBJECTIVE:  ? ?BP 132/83   Pulse 87   Wt 159 lb 9.6 oz (72.4 kg)   SpO2 100%   BMI 29.19 kg/m?   ? ?PHQ-9:  ? ?  10/29/2021  ?  2:29 PM 07/26/2021  ?  3:48 PM 04/27/2021  ?  9:19 AM  ?Depression screen PHQ 2/9  ?Decreased Interest 0 0 0  ?Down, Depressed, Hopeless 0 0 0  ?PHQ - 2 Score 0 0 0  ?Altered sleeping 0 0 0  ?Tired, decreased energy 0 1 0  ?Change in appetite 0 0 0  ?Feeling bad or failure about yourself  0 0 0  ?Trouble  concentrating 0 0 0  ?Moving slowly or fidgety/restless 0 0 0  ?Suicidal thoughts 0 0 0  ?PHQ-9 Score 0 1 0  ?Difficult doing work/chores Not difficult at all Not difficult at all Not difficult at all  ?  ?Physical Exam ?General: Awake, alert, oriented ?Cardiovascular: Regular rate and rhythm, S1 and S2 present, no murmurs auscultated ?Respiratory: Bilateral rhonchi of posterior superior fields, coarse lung sounds anteriorly ?Extremities: No bilateral lower extremity edema, palpable pedal and pretibial pulses bilaterally ?Neuro: Cranial nerves II through X grossly intact, able to move all extremities spontaneously  ? ?ASSESSMENT/PLAN:  ? ?Chronic bilateral low back pain with right-sided sciatica ?Subacute recurrence of chronic bilateral low back pain with the start of a second job.  Patient request meloxicam, reports she has used that as as needed in the past and that it works very well for her.  Last BMP notable for elevated creatinine.  Discussed with patient conservative measures.  We will obtain repeat BMP, if creatinine returned to baseline, will will prescribe meloxicam.  See AVS for more.  Return precautions discussed. ? ?Pneumonia of both upper lobes due to infectious organism ?Acute.  Given wildly uncontrolled diabetes and history of sarcoidosis of the lung, I will treat her conservatively as a immunocompromise patient with pneumonia.  Will order CXR and  Rx Augmentin x5 days.  Return and ED precautions given, see AVS for more. ? ?Diabetes mellitus ?Uncontrolled.  A1c today stable from January, today 9.6.  Patient not taking Marcelline Deist, only taking 500 mg metformin XR daily.  Adamantly denies insulin, GLP-1's, or Mounjaro due to needles.  She reports eating better and walking more.  Better control needed.  Patient agrees to increase metformin to 1000 mg twice daily.  Next recheck 3 months.  I am quite concerned about her long-term prognosis with an A1c as high for so long.  Will continue to discuss at future  appointments.  ? ?Hyperlipidemia associated with type 2 diabetes mellitus (HCC) ?Amenable to recheck today.  Will order direct LDL.  Just started back rosuvastatin 40 mg daily "a couple months ago". ? ?Hypertension associated with diabetes (HCC) ?Stable, at goal.  Taking olmesartan-HCTZ 40-12.5 mg daily.  No changes. ?  ? ?Fayette Pho, MD ?[facility]  ?

## 2021-10-29 ENCOUNTER — Ambulatory Visit (INDEPENDENT_AMBULATORY_CARE_PROVIDER_SITE_OTHER): Payer: 59 | Admitting: Family Medicine

## 2021-10-29 ENCOUNTER — Encounter: Payer: Self-pay | Admitting: Family Medicine

## 2021-10-29 ENCOUNTER — Ambulatory Visit
Admission: RE | Admit: 2021-10-29 | Discharge: 2021-10-29 | Disposition: A | Discharge status: Home / Self Care | Payer: 59 | Source: Ambulatory Visit | Attending: Family Medicine | Admitting: Family Medicine

## 2021-10-29 VITALS — BP 132/83 | HR 87 | Wt 159.6 lb

## 2021-10-29 DIAGNOSIS — J189 Pneumonia, unspecified organism: Secondary | ICD-10-CM | POA: Diagnosis not present

## 2021-10-29 DIAGNOSIS — E1169 Type 2 diabetes mellitus with other specified complication: Secondary | ICD-10-CM | POA: Diagnosis not present

## 2021-10-29 DIAGNOSIS — E785 Hyperlipidemia, unspecified: Secondary | ICD-10-CM

## 2021-10-29 DIAGNOSIS — E119 Type 2 diabetes mellitus without complications: Secondary | ICD-10-CM | POA: Diagnosis not present

## 2021-10-29 DIAGNOSIS — M5441 Lumbago with sciatica, right side: Secondary | ICD-10-CM

## 2021-10-29 DIAGNOSIS — R7989 Other specified abnormal findings of blood chemistry: Secondary | ICD-10-CM | POA: Diagnosis not present

## 2021-10-29 DIAGNOSIS — I152 Hypertension secondary to endocrine disorders: Secondary | ICD-10-CM

## 2021-10-29 DIAGNOSIS — G8929 Other chronic pain: Secondary | ICD-10-CM

## 2021-10-29 DIAGNOSIS — E1159 Type 2 diabetes mellitus with other circulatory complications: Secondary | ICD-10-CM

## 2021-10-29 HISTORY — DX: Pneumonia, unspecified organism: J18.9

## 2021-10-29 LAB — POCT GLYCOSYLATED HEMOGLOBIN (HGB A1C): HbA1c, POC (controlled diabetic range): 9.6 % — AB (ref 0.0–7.0)

## 2021-10-29 MED ORDER — METFORMIN HCL ER 500 MG PO TB24: 1000.0000 mg | ORAL_TABLET | Freq: Two times a day (BID) | ORAL | 3 refills | Status: DC

## 2021-10-29 MED ORDER — AMOXICILLIN-POT CLAVULANATE 875-125 MG PO TABS: 1.0000 | ORAL_TABLET | Freq: Two times a day (BID) | ORAL | 0 refills | Status: DC

## 2021-10-29 NOTE — Patient Instructions (Signed)
It was wonderful to meet you today. Thank you for allowing me to be a part of your care. Below is a short summary of what we discussed at your visit today: ? ?Back pain ?I need labs today to check on your kidney function before prescribing meloxicam.  If your kidney function (creatinine) has returned back to normal, I will prescribe meloxicam for you. ? ?Cough, phlegm, fever ?I am clinically diagnosing you with a pneumonia.  I wanted to take Augmentin as prescribed for 5 days.  Please go to get your chest x-ray at the [facility] imaging center.  Address and information below.  You may walk-in at any time to have this done during business hours. ? ?[facility] ?[address] [place], [place] [address] ?([id number] ?Mon-Fri 8-5 ? ? ?  ?Diabetes ?Your A1c is 9.6.  This is uncontrolled diabetes and is at the level that we normally use insulin for. ? ?Start taking metformin 1000 mg twice daily.  This will be 2 of your 500 mg pills taken twice daily. ? ?We really need to get you started on other medications to, including restarting your [place] or talking more about starting an injection medication like Mounjaro once weekly. ? ?Please have your ophthalmologist send their most recent eye exam paperwork to [place]. ? ?Cholesterol ?Today we checked your direct LDL, a marker for bad cholesterol.  Keep taking your rosuvastatin 40 mg, also called Crestor.  I will call you to talk about it and adjust your dosage if we need to. ? ? ?Please bring all of your medications to every appointment! ? ?If you have any questions or concerns, please do not hesitate to contact us via phone or MyChart message.  ? ?Ezequiel Essex, MD  ?

## 2021-10-30 LAB — BASIC METABOLIC PANEL
BUN/Creatinine Ratio: 18 (ref 9–23)
BUN: 15 mg/dL (ref 6–24)
CO2: 23 mmol/L (ref 20–29)
Calcium: 10 mg/dL (ref 8.7–10.2)
Chloride: 102 mmol/L (ref 96–106)
Creatinine, Ser: 0.83 mg/dL (ref 0.57–1.00)
Glucose: 160 mg/dL — ABNORMAL HIGH (ref 70–99)
Potassium: 4 mmol/L (ref 3.5–5.2)
Sodium: 141 mmol/L (ref 134–144)
eGFR: 83 mL/min/{1.73_m2} (ref >59)

## 2021-10-30 LAB — LDL CHOLESTEROL, DIRECT: LDL Direct: 72 mg/dL (ref 0–99)

## 2021-10-31 ENCOUNTER — Other Ambulatory Visit: Payer: Self-pay | Admitting: Family Medicine

## 2021-10-31 ENCOUNTER — Encounter: Payer: Self-pay | Admitting: Family Medicine

## 2021-10-31 DIAGNOSIS — M545 Low back pain, unspecified: Secondary | ICD-10-CM

## 2021-10-31 MED ORDER — MELOXICAM 15 MG PO TABS: 15.0000 mg | ORAL_TABLET | Freq: Every day | ORAL | 0 refills | Status: DC | PRN

## 2021-11-01 ENCOUNTER — Encounter: Payer: Self-pay | Admitting: Family Medicine

## 2021-11-03 DIAGNOSIS — G8929 Other chronic pain: Secondary | ICD-10-CM | POA: Insufficient documentation

## 2021-11-03 NOTE — Assessment & Plan Note (Signed)
Uncontrolled.  A1c today stable from January, today 9.6.  Patient not taking Marcelline Deist, only taking 500 mg metformin XR daily.  Adamantly denies insulin, GLP-1's, or Mounjaro due to needles.  She reports eating better and walking more.  Better control needed.  Patient agrees to increase metformin to 1000 mg twice daily.  Next recheck 3 months.  I am quite concerned about her long-term prognosis with an A1c as high for so long.  Will continue to discuss at future appointments.  ?

## 2021-11-03 NOTE — Assessment & Plan Note (Signed)
Stable, at goal.  Taking olmesartan-HCTZ 40-12.5 mg daily.  No changes. ?

## 2021-11-03 NOTE — Assessment & Plan Note (Signed)
Subacute recurrence of chronic bilateral low back pain with the start of a second job.  Patient request meloxicam, reports she has used that as as needed in the past and that it works very well for her.  Last BMP notable for elevated creatinine.  Discussed with patient conservative measures.  We will obtain repeat BMP, if creatinine returned to baseline, will will prescribe meloxicam.  See AVS for more.  Return precautions discussed. ?

## 2021-11-03 NOTE — Assessment & Plan Note (Signed)
Amenable to recheck today.  Will order direct LDL.  Just started back rosuvastatin 40 mg daily "a couple months ago". ?

## 2021-11-03 NOTE — Assessment & Plan Note (Signed)
Acute.  Given wildly uncontrolled diabetes and history of sarcoidosis of the lung, I will treat her conservatively as a immunocompromise patient with pneumonia.  Will order CXR and Rx Augmentin x5 days.  Return and ED precautions given, see AVS for more. ?

## 2021-11-08 ENCOUNTER — Ambulatory Visit (INDEPENDENT_AMBULATORY_CARE_PROVIDER_SITE_OTHER): Payer: Self-pay | Admitting: Podiatry

## 2021-11-08 DIAGNOSIS — Z91199 Patient's noncompliance with other medical treatment and regimen due to unspecified reason: Secondary | ICD-10-CM

## 2021-11-08 NOTE — Progress Notes (Signed)
   Complete physical exam  Patient: Cheryl Carroll   DOB: 04/23/2000   56 y.o. Female  MRN: [id number]  Subjective:    No chief complaint on file.   Cheryl Carroll is a 56 y.o. female who presents today for a complete physical exam. She reports consuming a {diet types:17450} diet. {types:19826} She generally feels {DESC; WELL/FAIRLY WELL/POORLY:18703}. She reports sleeping {DESC; WELL/FAIRLY WELL/POORLY:18703}. She {does/does not:200015} have additional problems to discuss today.    Most recent fall risk assessment:    12/29/2021   10:42 AM  Fall Risk   Falls in the past year? 0  Number falls in past yr: 0  Injury with Fall? 0  Risk for fall due to : No Fall Risks  Follow up Falls evaluation completed     Most recent depression screenings:    12/29/2021   10:42 AM 11/19/2020   10:46 AM  PHQ 2/9 Scores  PHQ - 2 Score 0 0  PHQ- 9 Score 5     {VISON DENTAL STD PSA (Optional):27386}  {History (Optional):23778}  Patient Care Team: Jessup, Joy, NP as PCP - General (Nurse Practitioner)   Outpatient Medications Prior to Visit  Medication Sig   fluticasone (FLONASE) 50 MCG/ACT nasal spray Place 2 sprays into both nostrils in the morning and at bedtime. After 7 days, reduce to once daily.   norgestimate-ethinyl estradiol (SPRINTEC 28) 0.25-35 MG-MCG tablet Take 1 tablet by mouth daily.   Nystatin POWD Apply liberally to affected area 2 times per day   spironolactone (ALDACTONE) 100 MG tablet Take 1 tablet (100 mg total) by mouth daily.   No facility-administered medications prior to visit.    ROS        Objective:     There were no vitals taken for this visit. {Vitals History (Optional):23777}  Physical Exam   No results found for any visits on 02/03/22. {Show previous labs (optional):23779}    Assessment & Plan:    Routine Health Maintenance and Physical Exam  Immunization History  Administered Date(s) Administered   DTaP 07/07/1999, 09/02/1999,  11/11/1999, 07/27/2000, 02/10/2004   Hepatitis A 12/07/2007, 12/12/2008   Hepatitis B 04/24/1999, 06/01/1999, 11/11/1999   HiB (PRP-OMP) 07/07/1999, 09/02/1999, 11/11/1999, 07/27/2000   IPV 07/07/1999, 09/02/1999, 05/01/2000, 02/10/2004   Influenza,inj,Quad PF,6+ Mos 03/14/2014   Influenza-Unspecified 06/13/2012   MMR 05/01/2001, 02/10/2004   Meningococcal Polysaccharide 12/12/2011   Pneumococcal Conjugate-13 07/27/2000   Pneumococcal-Unspecified 11/11/1999, 01/25/2000   Tdap 12/12/2011   Varicella 05/01/2000, 12/07/2007    Health Maintenance  Topic Date Due   HIV Screening  Never done   Hepatitis C Screening  Never done   INFLUENZA VACCINE  02/01/2022   PAP-Cervical Cytology Screening  02/03/2022 (Originally 04/22/2020)   PAP SMEAR-Modifier  02/03/2022 (Originally 04/22/2020)   TETANUS/TDAP  02/03/2022 (Originally 12/11/2021)   HPV VACCINES  Discontinued   COVID-19 Vaccine  Discontinued    Discussed health benefits of physical activity, and encouraged her to engage in regular exercise appropriate for her age and condition.  Problem List Items Addressed This Visit   None Visit Diagnoses     Annual physical exam    -  Primary   Cervical cancer screening       Need for Tdap vaccination          No follow-ups on file.     Joy Jessup, NP   

## 2021-11-10 ENCOUNTER — Ambulatory Visit (INDEPENDENT_AMBULATORY_CARE_PROVIDER_SITE_OTHER): Payer: 59 | Admitting: Family Medicine

## 2021-11-10 DIAGNOSIS — R059 Cough, unspecified: Secondary | ICD-10-CM | POA: Insufficient documentation

## 2021-11-10 DIAGNOSIS — R051 Acute cough: Secondary | ICD-10-CM | POA: Diagnosis not present

## 2021-11-10 HISTORY — DX: Cough, unspecified: R05.9

## 2021-11-10 MED ORDER — GUAIFENESIN ER 600 MG PO TB12: 600.0000 mg | ORAL_TABLET | Freq: Two times a day (BID) | ORAL | 0 refills | Status: AC

## 2021-11-10 MED ORDER — SALINE SPRAY 0.65 % NA SOLN: 1.0000 | NASAL | 0 refills | Status: DC | PRN

## 2021-11-10 MED ORDER — PREDNISONE 20 MG PO TABS: 40.0000 mg | ORAL_TABLET | Freq: Every day | ORAL | 0 refills | Status: AC

## 2021-11-10 NOTE — Progress Notes (Addendum)
    SUBJECTIVE:   CHIEF COMPLAINT / HPI:   Cough Cheryl Carroll is a 56 y.o. female who presents to the [facility] clinic today to discuss ongoing clear productive cough for 2-3 weeks. Also feels congested in nose and chest. No sick contacts but states that her grandson is starting to get similar symptoms. No fevers but she does endorse a headache. No sore throat. Runny nose only when she feels cold. Has tried drinking "a bunch of hot stuff" to help "open up my chest". States that she was here a week ago and was prescribed antibiotics and had a chest x-ray done that was normal. States that she completed the course.   Reviewed chest x-ray from 4/28 that is negative for PNA.   PERTINENT  PMH / PSH: HTN, HLD, sarcoidosis  OBJECTIVE:   BP 116/71   Pulse 80   Ht 5\' 2"  (1.575 m)   Wt 156 lb 3.2 oz (70.9 kg)   SpO2 100%   BMI 28.57 kg/m    General: NAD, pleasant, able to participate in exam HEENT: Normocephalic, atraumatic, oropharynx not erythematous and no tonsillar exudates, nasal turbinates erythematous  Neck: Supple, no LAD Cardiac: RRR, no murmurs. Respiratory: CTAB, normal effort, No wheezes, rales or rhonchi Extremities: no edema or cyanosis. Skin: warm and dry, no rashes noted Psych: Normal affect and mood  ASSESSMENT/PLAN:   Cough Acute cough for the last 2 to 3 weeks.  Favor more viral etiology over bacterial given her negative chest x-ray, though she does have a history of sarcoidosis so will trial 5-day Prednisone burst. Her symptoms have been ongoing despite completion of her 5-day course of antibiotics.  She appears nontoxic today, lungs are clear bilaterally, SpO2 100%. Could also be related to post-nasal drip from allergies  -Prednisone 40 mg x 5 days; counseled to monitor blood sugars during this time  -Conservative measures with mucinex, honey, nasal saline spray -Start Zyrtec for allergies -Return if persisting >8 weeks, if hemoptysis or if affecting breathing      , DO [facility]

## 2021-11-10 NOTE — Addendum Note (Signed)
Addended by: Sabino Dick on: 11/10/2021 04:19 PM ? ? Modules accepted: Orders ? ?

## 2021-11-10 NOTE — Assessment & Plan Note (Addendum)
Acute cough for the last 2 to 3 weeks.  Favor more viral etiology over bacterial given her negative chest x-ray, though she does have a history of sarcoidosis so will trial 5-day Prednisone burst. Her symptoms have been ongoing despite completion of her 5-day course of antibiotics.  She appears nontoxic today, lungs are clear bilaterally, SpO2 100%. Could also be related to post-nasal drip from allergies  ?-Prednisone 40 mg x 5 days; counseled to monitor blood sugars during this time  ?-Conservative measures with mucinex, honey, nasal saline spray ?-Start Zyrtec for allergies ?-Return if persisting >8 weeks, if hemoptysis or if affecting breathing  ?

## 2021-11-10 NOTE — Patient Instructions (Signed)
It was wonderful to see you today. ? ?Today we talked about: ? ?-I think this could be a viral infection. I would recommend taking Mucinex, nasal saline spray, taking a teaspoon of honey to help your symptoms ?-You can also start Zyrtec for allergies.  ?-Return if you feel that your breathing is worse, if your cough does not improve within a month, if you start coughing blood or any concerning symptoms.  ? ?Thank you for choosing [facility].  ? ?Please call [phone] with any questions about today's appointment. ? ?Please be sure to schedule follow up at the front  desk before you leave today.  ? ?Sabino Dick, DO ?PGY-2 Family Medicine   ?

## 2021-11-17 LAB — HM DIABETES EYE EXAM

## 2022-01-31 ENCOUNTER — Other Ambulatory Visit: Payer: Self-pay | Admitting: Family Medicine

## 2022-01-31 DIAGNOSIS — M545 Low back pain, unspecified: Secondary | ICD-10-CM

## 2022-03-21 ENCOUNTER — Other Ambulatory Visit: Payer: Self-pay

## 2022-03-21 DIAGNOSIS — M545 Low back pain, unspecified: Secondary | ICD-10-CM

## 2022-03-22 MED ORDER — MELOXICAM 15 MG PO TABS: 15.0000 mg | ORAL_TABLET | Freq: Every day | ORAL | 1 refills | Status: DC | PRN

## 2022-04-12 ENCOUNTER — Ambulatory Visit: Payer: 59 | Admitting: Family Medicine

## 2022-04-19 ENCOUNTER — Ambulatory Visit: Payer: 59 | Admitting: Family Medicine

## 2022-04-29 ENCOUNTER — Ambulatory Visit: Payer: 59 | Admitting: Family Medicine

## 2022-04-29 ENCOUNTER — Encounter: Payer: Self-pay | Admitting: Family Medicine

## 2022-04-29 VITALS — BP 134/79 | HR 83 | Ht 62.0 in | Wt 154.1 lb

## 2022-04-29 DIAGNOSIS — Z23 Encounter for immunization: Secondary | ICD-10-CM

## 2022-04-29 DIAGNOSIS — Z1211 Encounter for screening for malignant neoplasm of colon: Secondary | ICD-10-CM | POA: Diagnosis not present

## 2022-04-29 DIAGNOSIS — E1159 Type 2 diabetes mellitus with other circulatory complications: Secondary | ICD-10-CM

## 2022-04-29 DIAGNOSIS — Z0000 Encounter for general adult medical examination without abnormal findings: Secondary | ICD-10-CM

## 2022-04-29 DIAGNOSIS — E1169 Type 2 diabetes mellitus with other specified complication: Secondary | ICD-10-CM

## 2022-04-29 DIAGNOSIS — E785 Hyperlipidemia, unspecified: Secondary | ICD-10-CM

## 2022-04-29 DIAGNOSIS — E119 Type 2 diabetes mellitus without complications: Secondary | ICD-10-CM | POA: Diagnosis not present

## 2022-04-29 DIAGNOSIS — R7989 Other specified abnormal findings of blood chemistry: Secondary | ICD-10-CM

## 2022-04-29 DIAGNOSIS — I152 Hypertension secondary to endocrine disorders: Secondary | ICD-10-CM

## 2022-04-29 LAB — POCT GLYCOSYLATED HEMOGLOBIN (HGB A1C): HbA1c, POC (controlled diabetic range): 8.8 % — AB (ref 0.0–7.0)

## 2022-04-29 NOTE — Patient Instructions (Addendum)
It was wonderful to see you today. Thank you for allowing me to be a part of your care. Below is a short summary of what we discussed at your visit today:  Diabetes  Today your A1c was 8.8. We collected urine to see if your diabetes is causing your kidneys to spill protein into the urine.   Continue the metformin XR 500 mg daily.  Start the farxiga every day.  Keep up the great work with the walking every day! It is doing wonders for your blood sugar.   Vaccines Today you received the annual flu vaccine and Tdap vaccine.  You may experience some residual soreness at the injection site.  Gentle stretches and regular use of that arm will help speed up your recovery.  As the vaccines are giving your immune system a "practice run" against specific infections, you may feel a little under the weather for the next several days.  We recommend rest as needed and hydrating.  Colonoscopy It is time to start your colonoscopies to screen for colon cancer. I have referred you to GI for colonoscopy.  Someone from their office should be calling you in 1 to 2 weeks to schedule an appointment.  If you do not hear from them, let us know. We may need to nudge along the referral.    Health Maintenance We like to think about ways to keep you healthy for years to come. Below are some interventions and screenings we can offer to keep you healthy: - Shingles vaccine (available at your pharmacy) - Colonoscopy to screen for colon cancer - PAP smear to screen for cervical cancer  Please bring all of your medications to every appointment!  If you have any questions or concerns, please do not hesitate to contact us via phone or MyChart message.   Ezequiel Essex, MD

## 2022-04-29 NOTE — Assessment & Plan Note (Signed)
Stable, well controlled.  Continue olmesartan-HCTZ 40-12.5 mg.  Tolerating well without side effects.

## 2022-04-29 NOTE — Assessment & Plan Note (Signed)
Improved.  A1c 8.8, down from 9.6 in April.  Patient reports taking only metformin 500 mg daily and greatly increasing her exercise. - Continue physical activity - Continue metformin XR 5 mg daily - Restart Farxiga 10 mg daily - Next A1c in January

## 2022-04-29 NOTE — Assessment & Plan Note (Signed)
UTD on Optho exam and mammogram. Received Tdap and flu today. Referred for colonoscopy. Patient plans to complete Pap smear at next visit in January.

## 2022-04-29 NOTE — Progress Notes (Signed)
    SUBJECTIVE:   CHIEF COMPLAINT / HPI:   T2DM Currently taking metformin XR 500 mg daily - Reports any amounts over 500 mg make her stomach upset Not taking farxiga, but open to starting Declines anything injectable out of fear of needles UTD on ophtho exam, completed May 2023 On [facility] - olmesartan  Lab Results  Component Value Date   HGBA1C 9.6 (A) 10/29/2021   HGBA1C 10.0 (A) 07/26/2021   HGBA1C 8.9 (A) 09/01/2020   Lab Results  Component Value Date   MICROALBUR 10.2 07/24/2018   LDLCALC 100 (H) 10/24/2019   CREATININE 0.83 10/29/2021   HTN Current regimen is olmesartan-hydrochlorothiazide (BENICAR HCT) 40-12.5 MG tablet Tolerating well, no adverse side effects.  No dizziness, presyncope, orthostatic hypotension. BP Readings from Last 3 Encounters:  04/29/22 134/79  11/10/21 116/71  10/29/21 132/83    Health maintenance Patient currently due for the following: - TDAP - Flu - colonoscopy - PAP  PERTINENT  PMH / PSH:  Patient Active Problem List   Diagnosis Date Noted   Chronic bilateral low back pain with right-sided sciatica 11/03/2021   Healthcare maintenance 09/01/2020   Plantar fasciitis 11/15/2019   Sciatica of right side 04/01/2019   Allergic rhinitis due to pollen 12/03/2015   Diabetes mellitus ([place]) 01/27/2011   Hyperlipidemia associated with type 2 diabetes mellitus ([place]) 12/01/2006   H/O sarcoidosis 08/31/2006   Hypertension associated with diabetes ([place]) 08/31/2006    OBJECTIVE:   BP 134/79   Pulse 83   Ht 5\' 2"  (1.575 m)   Wt 154 lb 2 oz (69.9 kg)   SpO2 99%   BMI 28.19 kg/m    PHQ-9:     04/29/2022    2:45 PM 11/10/2021    3:45 PM 10/29/2021    2:29 PM  Depression screen PHQ 2/9  Decreased Interest 0 0 0  Down, Depressed, Hopeless 0 0 0  PHQ - 2 Score 0 0 0  Altered sleeping 0 0 0  Tired, decreased energy 0 0 0  Change in appetite 0 0 0  Feeling bad or failure about yourself  0 0 0  Trouble concentrating 0 0 0  Moving slowly  or fidgety/restless 0 0 0  Suicidal thoughts 0 0 0  PHQ-9 Score 0 0 0  Difficult doing work/chores   Not difficult at all   Physical Exam General: Awake, alert, oriented Cardiovascular: Regular rate and rhythm, S1 and S2 present, no murmurs auscultated Respiratory: Lung fields clear to auscultation bilaterally  ASSESSMENT/PLAN:   Hypertension associated with diabetes (HCC) Stable, well controlled.  Continue olmesartan-HCTZ 40-12.5 mg.  Tolerating well without side effects.  Hyperlipidemia associated with type 2 diabetes mellitus ([place]) Plan for fasting lipid panel in January with next A1c.  Diabetes mellitus Improved.  A1c 8.8, down from 9.6 in April.  Patient reports taking only metformin 500 mg daily and greatly increasing her exercise. - Continue physical activity - Continue metformin XR 5 mg daily - Restart Farxiga 10 mg daily - Next A1c in January  Healthcare maintenance UTD on Optho exam and mammogram. Received Tdap and flu today. Referred for colonoscopy. Patient plans to complete Pap smear at next visit in January.     Ezequiel Essex, MD [place]

## 2022-04-29 NOTE — Assessment & Plan Note (Signed)
Plan for fasting lipid panel in January with next A1c.

## 2022-05-01 LAB — MICROALBUMIN / CREATININE URINE RATIO
Creatinine, Urine: 16.2 mg/dL
Microalb/Creat Ratio: <19 mg/g creat (ref 0–29)
Microalbumin, Urine: <3 ug/mL

## 2022-05-02 ENCOUNTER — Encounter: Payer: Self-pay | Admitting: Family Medicine

## 2022-05-09 ENCOUNTER — Encounter: Payer: Self-pay | Admitting: Family Medicine

## 2022-05-25 ENCOUNTER — Other Ambulatory Visit: Payer: Self-pay | Admitting: Family Medicine

## 2022-05-25 DIAGNOSIS — M545 Low back pain, unspecified: Secondary | ICD-10-CM

## 2022-05-25 NOTE — Telephone Encounter (Signed)
Will refill with lower dose of 7.5 mg instead of 15 mg for harm reduction. Previous documentation state the patient believes the 7.5 mg works just as well for her as the 15 mg.   Fayette Pho, MD

## 2022-06-24 ENCOUNTER — Other Ambulatory Visit: Payer: Self-pay | Admitting: Family Medicine

## 2022-06-24 DIAGNOSIS — E1169 Type 2 diabetes mellitus with other specified complication: Secondary | ICD-10-CM

## 2022-06-24 DIAGNOSIS — M545 Low back pain, unspecified: Secondary | ICD-10-CM

## 2022-06-24 MED ORDER — ROSUVASTATIN CALCIUM 40 MG PO TABS: 40.0000 mg | ORAL_TABLET | Freq: Every day | ORAL | 2 refills | Status: DC

## 2022-07-06 ENCOUNTER — Ambulatory Visit (INDEPENDENT_AMBULATORY_CARE_PROVIDER_SITE_OTHER): Payer: 59 | Admitting: Student

## 2022-07-06 VITALS — BP 124/62 | HR 100 | Ht 62.0 in | Wt 158.0 lb

## 2022-07-06 DIAGNOSIS — J301 Allergic rhinitis due to pollen: Secondary | ICD-10-CM | POA: Diagnosis not present

## 2022-07-06 MED ORDER — SALINE SPRAY 0.65 % NA SOLN: 1.0000 | NASAL | 0 refills | Status: DC | PRN

## 2022-07-06 NOTE — Progress Notes (Signed)
    SUBJECTIVE:   CHIEF COMPLAINT / HPI:   Patient is a 57 year old female presenting today for concerns of sinus infection. Symptoms started with congestion for about 4 weeks and feeling pressure on her face. Reports no fever but associated symptoms include headache and running nose  Has tried muscinex and tylenol without relieve. She has had normal appetite and drinking adequately. No  known recent sick contact. No ear pain, vomiting or diarrhea.  PERTINENT  PMH / PSH: Reviewed   OBJECTIVE:   BP 124/62   Pulse 100   Ht 5\' 2"  (1.575 m)   Wt 158 lb (71.7 kg)   SpO2 99%   BMI 28.90 kg/m    Physical Exam General: Alert, well appearing, NAD HEENT: No oropharyngeal erythema, no tonsillar exudates or lymphadenopathy. Cardiovascular: RRR, No Murmurs, Normal S2/S2 Respiratory: CTAB, No wheezing or Rales   ASSESSMENT/PLAN:   Allergic rhinitis due to pollen Patient's symptoms are consistent with allergic rhinitis in the absence of fever or other infectious signs.  Exam was unremarkable. -Refilled Ocean nasal spray which patient said has been effective in the past -Advised patient to use humidifier nightly -Return precautions reviewed with patient who verbalized understanding and amenable to plan.     Alen Bleacher, MD [facility]

## 2022-07-06 NOTE — Patient Instructions (Signed)
It was wonderful to meet you today. Thank you for allowing me to be a part of your care. Below is a short summary of what we discussed at your visit today:  Symptoms are consistent with allergic rhinitis.  I have refilled your home version which is a nasal spray that you apply daily as prescribed.  I also recommend using humidifier at night which can also help with your breathing and congestion.  Please bring all of your medications to every appointment!  If you have any questions or concerns, please do not hesitate to contact us via phone or MyChart message.   Alen Bleacher, MD [facility]

## 2022-07-06 NOTE — Assessment & Plan Note (Addendum)
Patient's symptoms are consistent with allergic rhinitis in the absence of fever or other infectious signs.  Exam was unremarkable. -Refilled Ocean nasal spray which patient said has been effective in the past -Advised patient to use humidifier nightly -Return precautions reviewed with patient who verbalized understanding and amenable to plan.

## 2022-07-11 ENCOUNTER — Encounter: Payer: Self-pay | Admitting: Family Medicine

## 2022-07-11 ENCOUNTER — Ambulatory Visit (INDEPENDENT_AMBULATORY_CARE_PROVIDER_SITE_OTHER): Payer: 59 | Admitting: Family Medicine

## 2022-07-11 VITALS — BP 120/60 | HR 100 | Ht 62.0 in | Wt 157.6 lb

## 2022-07-11 DIAGNOSIS — B9689 Other specified bacterial agents as the cause of diseases classified elsewhere: Secondary | ICD-10-CM

## 2022-07-11 DIAGNOSIS — J329 Chronic sinusitis, unspecified: Secondary | ICD-10-CM | POA: Diagnosis not present

## 2022-07-11 MED ORDER — AMOXICILLIN 875 MG PO TABS: 875.0000 mg | ORAL_TABLET | Freq: Two times a day (BID) | ORAL | 0 refills | Status: AC

## 2022-07-11 NOTE — Assessment & Plan Note (Signed)
Given initial 3 to 4 weeks of URI symptoms initially improving, followed up by 1 week of worsening congestion sinus pain, will treat as bacterial sinusitis.  Rx amoxicillin 875 mg twice daily x 5 days.  Return precautions given, see AVS for more.

## 2022-07-11 NOTE — Patient Instructions (Signed)
It was wonderful to see you today. Thank you for allowing me to be a part of your care. Below is a short summary of what we discussed at your visit today:  Sinus pressure and congestion I am treating you like you have a bacterial sinus infection, although these are honestly quite rare.  Because of your story and your high risk work environment I think it is worth treating you.  Take amoxicillin 875 mg twice daily x 5 days.  If you do not see some improvement in 2 to 3 days, I would like to hear about this.  Also, if you get worse, have more tenderness over your cheeks and forehead, or develop high fevers please call us.   Please bring all of your medications to every appointment!  If you have any questions or concerns, please do not hesitate to contact us via phone or MyChart message.   Ezequiel Essex, MD

## 2022-07-11 NOTE — Progress Notes (Signed)
amoxic   SUBJECTIVE:   CHIEF COMPLAINT / HPI:   Congestion and sinus pain Patient presents with worsening sinus pain and congestion.  She reports congestion and rhinorrhea initially started 4 to 5 weeks ago, she was getting better initially and has since developed worsening sinus and upper tooth pain, also fatigue with exertion last week.  Denies fevers, difficulty eating, rashes, n/v/d, SOB.   Numerous sick exposures include all of her grandchildren sick with upper respiratory illnesses.  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  PERTINENT  PMH / PSH:  Patient Active Problem List   Diagnosis Date Noted   Bacterial sinusitis 07/11/2022   Chronic bilateral low back pain with right-sided sciatica 11/03/2021   Healthcare maintenance 09/01/2020   Plantar fasciitis 11/15/2019   Sciatica of right side 04/01/2019   Allergic rhinitis due to pollen 12/03/2015   Diabetes mellitus ([place]) 01/27/2011   Hyperlipidemia associated with type 2 diabetes mellitus ([place]) 12/01/2006   H/O sarcoidosis 08/31/2006   Hypertension associated with diabetes ([place]) 08/31/2006    OBJECTIVE:   BP 120/60   Pulse 100   Ht 5\' 2"  (1.575 m)   Wt 157 lb 9.6 oz (71.5 kg)   SpO2 97%   BMI 28.83 kg/m    General: Awake, alert, NAD HEENT: TMs pearly pink and flat bilaterally, nares unremarkable, oral mucosa moist, sclera anicteric Neck: Shotty anterior left lymphadenopathy of the right neck Cardiac: Regular rate and rhythm, no murmurs Respiratory: CTAB all fields anterior and posterior  ASSESSMENT/PLAN:   Bacterial sinusitis Given initial 3 to 4 weeks of URI symptoms initially improving, followed up by 1 week of worsening congestion sinus pain, will treat as bacterial sinusitis.  Rx amoxicillin 875 mg twice daily x 5 days.  Return precautions given, see AVS for more.     Ezequiel Essex, MD [place]

## 2022-07-12 ENCOUNTER — Other Ambulatory Visit: Payer: Self-pay | Admitting: Family Medicine

## 2022-07-12 DIAGNOSIS — J301 Allergic rhinitis due to pollen: Secondary | ICD-10-CM

## 2022-08-29 ENCOUNTER — Other Ambulatory Visit: Payer: Self-pay | Admitting: Family Medicine

## 2022-08-29 DIAGNOSIS — M545 Low back pain, unspecified: Secondary | ICD-10-CM

## 2022-09-26 ENCOUNTER — Other Ambulatory Visit: Payer: Self-pay | Admitting: Family Medicine

## 2022-09-26 DIAGNOSIS — M545 Low back pain, unspecified: Secondary | ICD-10-CM

## 2022-09-27 ENCOUNTER — Other Ambulatory Visit: Payer: Self-pay | Admitting: Family Medicine

## 2022-11-17 ENCOUNTER — Other Ambulatory Visit: Payer: Self-pay

## 2022-11-17 DIAGNOSIS — M545 Low back pain, unspecified: Secondary | ICD-10-CM

## 2022-11-17 MED ORDER — MELOXICAM 7.5 MG PO TABS: 7.5000 mg | ORAL_TABLET | Freq: Every day | ORAL | 0 refills | Status: DC | PRN

## 2023-01-16 ENCOUNTER — Other Ambulatory Visit: Payer: Self-pay

## 2023-01-16 DIAGNOSIS — E119 Type 2 diabetes mellitus without complications: Secondary | ICD-10-CM

## 2023-01-18 MED ORDER — METFORMIN HCL ER 500 MG PO TB24: 1000.0000 mg | ORAL_TABLET | Freq: Two times a day (BID) | ORAL | 3 refills | Status: DC

## 2023-08-15 ENCOUNTER — Ambulatory Visit: Payer: 59

## 2023-08-15 VITALS — BP 116/73 | HR 91 | Ht 62.0 in | Wt 162.8 lb

## 2023-08-15 DIAGNOSIS — R051 Acute cough: Secondary | ICD-10-CM | POA: Diagnosis not present

## 2023-08-15 DIAGNOSIS — E119 Type 2 diabetes mellitus without complications: Secondary | ICD-10-CM

## 2023-08-15 LAB — POCT GLYCOSYLATED HEMOGLOBIN (HGB A1C): HbA1c, POC (controlled diabetic range): 12.4 % — AB (ref 0.0–7.0)

## 2023-08-15 MED ORDER — FLUTICASONE PROPIONATE 50 MCG/ACT NA SUSP: 1.0000 | Freq: Every day | NASAL | 0 refills | Status: DC

## 2023-08-15 NOTE — Patient Instructions (Signed)
It was great to see you! Thank you for allowing me to participate in your care!  I recommend that you always bring your medications to each appointment as this makes it easy to ensure you are on the correct medications and helps [place] not miss when refills are needed.  Our plans for today:  - you likely have viral bronchitis which will improve on its own  - Use Flonase 1 spray in each nostril daily - can try over the counter mucinex, stay hydrated with water to thin mucus - Hot tea with a little honey - If no improvement within 1 week, let us know and we can order a chest x ray - return in ~1 week to discuss diabetes    Take care and seek immediate care sooner if you develop any concerns.   Dr. Erick Alley, DO [facility]

## 2023-08-15 NOTE — Progress Notes (Unsigned)
    SUBJECTIVE:   CHIEF COMPLAINT / HPI:   Cough Present for past 2 weeks. Is productive with clear/yellowish mucus. Also has rhinorrhea for past couple days. Started having upper back pain related to coughing today. No blood in sputum. Does not smoke, never has.  Has been taking robitussin, doesn't help  No fever, body aches, vomiting, diarrhea, or worsening fatigue. Is eating and drinking well.  Has allergies, and takes zyrtec prn but does not feel like this is allergies.  Has history of bronchitis  Sarcoidosis listed on problem list, unclear if this is an accurate diagnosis, no CT scan  PERTINENT  PMH / PSH: Sarcoidosis?  OBJECTIVE:   BP 116/73   Pulse 91   Ht 5\' 2"  (1.575 m)   Wt 162 lb 12.8 oz (73.8 kg)   SpO2 97%   BMI 29.78 kg/m    General: NAD, pleasant, able to participate in exam HEENT: White sclera, clear conjunctiva, MMM, no erythema or exudate of oropharynx Cardiac: RRR, no murmurs. Respiratory: CTAB, normal effort, No wheezes, rales or rhonchi.  Coughing throughout exam.  Breathing comfortably on room air Skin: warm and dry Neuro: alert, no obvious focal deficits Psych: Normal affect and mood  ASSESSMENT/PLAN:   Acute cough Acute cough for past 2 weeks.  No other associated symptoms and is feeling well/like her normal self. He does have " history of sarcoidosis" on problem list though on chart review, no clear evidence about this up. Suspect this is bronchitis, most likely viral.  Patient reassuringly breathing comfortably on room air with good SpO2, low concern for pneumonia given no focal lung sounds. -Treat conservatively, discussed supportive care -Will trial Flonase to help with postnasal drip and allergies -Patient scheduled for 2/18, if cough worsens or persist, can consider CXR  Diabetes mellitus Uncontrolled with A1c 12.4. -Patient scheduled for 08/22/2023 to discuss medications for T2DM     Dr. Erick Alley, DO [facility]

## 2023-08-16 DIAGNOSIS — R051 Acute cough: Secondary | ICD-10-CM | POA: Insufficient documentation

## 2023-08-16 NOTE — Assessment & Plan Note (Signed)
Uncontrolled with A1c 12.4. -Patient scheduled for 08/22/2023 to discuss medications for T2DM

## 2023-08-16 NOTE — Assessment & Plan Note (Signed)
Acute cough for past 2 weeks.  No other associated symptoms and is feeling well/like her normal self. He does have " history of sarcoidosis" on problem list though on chart review, no clear evidence about this up. Suspect this is bronchitis, most likely viral.  Patient reassuringly breathing comfortably on room air with good SpO2, low concern for pneumonia given no focal lung sounds. -Treat conservatively, discussed supportive care -Will trial Flonase to help with postnasal drip and allergies -Patient scheduled for 2/18, if cough worsens or persist, can consider CXR

## 2023-08-22 ENCOUNTER — Ambulatory Visit (INDEPENDENT_AMBULATORY_CARE_PROVIDER_SITE_OTHER): Payer: 59 | Admitting: Family Medicine

## 2023-08-22 ENCOUNTER — Encounter: Payer: Self-pay | Admitting: Family Medicine

## 2023-08-22 VITALS — BP 124/70 | HR 75 | Ht 62.0 in | Wt 163.5 lb

## 2023-08-22 DIAGNOSIS — E1165 Type 2 diabetes mellitus with hyperglycemia: Secondary | ICD-10-CM

## 2023-08-22 DIAGNOSIS — Z794 Long term (current) use of insulin: Secondary | ICD-10-CM

## 2023-08-22 MED ORDER — ONETOUCH VERIO W/DEVICE KIT: PACK | 0 refills | Status: AC

## 2023-08-22 MED ORDER — ONETOUCH VERIO VI STRP: ORAL_STRIP | 3 refills | Status: AC

## 2023-08-22 MED ORDER — INSULIN GLARGINE 100 UNIT/ML SOLOSTAR PEN: 10.0000 [IU] | PEN_INJECTOR | SUBCUTANEOUS | 1 refills | Status: DC

## 2023-08-22 MED ORDER — ONETOUCH DELICA LANCETS 33G MISC: 3 refills | Status: AC

## 2023-08-22 MED ORDER — ONETOUCH DELICA LANCING DEV MISC: 0 refills | Status: AC

## 2023-08-22 NOTE — Assessment & Plan Note (Signed)
Uncontrolled, last A1c 12.4. VSS. No DKA symptoms. -Start 10u Lantus daily -Check CBG TID -Will need titration based on fasting CBG -Will schedule with Dr. Raymondo Band this week for titration -Follow-up 2 weeks -Goal A1c less than 10, hopefully can come off of insulin with other medication control (Farxiga, Ozempic) -Counseled extensively regarding hypoglycemia symptoms  -Needs repeat Lipid panel, BMP, Urine microalbumin, foot exam, and Ophthalmology referral at f/u

## 2023-08-22 NOTE — Patient Instructions (Addendum)
It was great to see you! Thank you for allowing me to participate in your care!  Our plans for today:  - I have the sugar testing supplies to your pharmacy. - Please check you sugar 3 times daily, and write these down and bring in to your next visit. - Please start taking 10units of Lantus (Insulin daily). I recommend taking at the same time each day before you go to bed. - We will see you again in 2 weeks. - Please schedule an appointment with Dr. Raymondo Band our pharmacist at the front when you leave.   Please arrive 15 minutes PRIOR to your next scheduled appointment time! If you do not, this affects OTHER patients' care.  Take care and seek immediate care sooner if you develop any concerns.   [name], MD, PGY-2 [facility] 8:43 AM 08/22/2023  [facility]

## 2023-08-22 NOTE — Progress Notes (Signed)
    SUBJECTIVE:   CHIEF COMPLAINT / HPI: f/u diabetes  Here to discuss diabetes medications. Has previously been on metformin but had significant side effects. Has helped mom take insulin. Agreeable to starting insulin. No abdominal pain, nausea or vomiting. No frequent urination. Last A1c 12.4.   PERTINENT  PMH / PSH: HTN, Diabetes, Sarcoid?, HLD  OBJECTIVE:   BP 124/70   Pulse 75   Ht 5\' 2"  (1.575 m)   Wt 163 lb 8 oz (74.2 kg)   SpO2 99%   BMI 29.90 kg/m   General: NAD, well appearing Neuro: A&O Respiratory: normal WOB on RA Extremities: Moving all 4 extremities equally   ASSESSMENT/PLAN:   Assessment & Plan Uncontrolled diabetes mellitus with hyperglycemia, without long-term current use of insulin (HCC) Uncontrolled, last A1c 12.4. VSS. No DKA symptoms. -Start 10u Lantus daily -Check CBG TID -Will need titration based on fasting CBG -Will schedule with Dr. Raymondo Band this week for titration -Follow-up 2 weeks -Goal A1c less than 10, hopefully can come off of insulin with other medication control (Farxiga, Ozempic) -Counseled extensively regarding hypoglycemia symptoms  -Needs repeat Lipid panel, BMP, Urine microalbumin, foot exam, and Ophthalmology referral at f/u  Return in about 2 weeks (around 09/05/2023) for PCP diabetes, HM.  [name], MD [facility]

## 2023-08-25 ENCOUNTER — Encounter: Payer: Self-pay | Admitting: Pharmacist

## 2023-08-25 ENCOUNTER — Ambulatory Visit: Payer: 59 | Admitting: Pharmacist

## 2023-08-25 VITALS — BP 138/60 | HR 80 | Wt 166.0 lb

## 2023-08-25 DIAGNOSIS — E1165 Type 2 diabetes mellitus with hyperglycemia: Secondary | ICD-10-CM

## 2023-08-25 DIAGNOSIS — E785 Hyperlipidemia, unspecified: Secondary | ICD-10-CM

## 2023-08-25 DIAGNOSIS — E1169 Type 2 diabetes mellitus with other specified complication: Secondary | ICD-10-CM | POA: Diagnosis not present

## 2023-08-25 DIAGNOSIS — R051 Acute cough: Secondary | ICD-10-CM

## 2023-08-25 MED ORDER — FLUTICASONE PROPIONATE 50 MCG/ACT NA SUSP: 2.0000 | Freq: Two times a day (BID) | NASAL | 4 refills | Status: DC | PRN

## 2023-08-25 MED ORDER — ROSUVASTATIN CALCIUM 40 MG PO TABS: 40.0000 mg | ORAL_TABLET | Freq: Every day | ORAL | 3 refills | Status: DC

## 2023-08-25 MED ORDER — INSULIN PEN NEEDLE 32G X 4 MM MISC: 11 refills | Status: DC

## 2023-08-25 MED ORDER — INSULIN GLARGINE 100 UNIT/ML SOLOSTAR PEN: 14.0000 [IU] | PEN_INJECTOR | SUBCUTANEOUS | Status: DC

## 2023-08-25 NOTE — Assessment & Plan Note (Signed)
Diabetes longstanding currently uncontrolled. Patient is able to verbalize appropriate hypoglycemia management plan. Medication adherence appears optimal for all medications except rosuvastatin (patient stopped taking mid 2024 after running out of refills). Control is suboptimal due to A1C from 08/15/23 at 12.4 and above target morning fasting glucose ranging from 218-319. -Increased dose of basal insulin Lantus (insulin glargine) from 10 units to 14 units daily in the morning. Patient will continue to titrate 1 unit everyday if fasting blood sugar > 150mg /dl until fasting blood sugars reach goal or next visit.

## 2023-08-25 NOTE — Progress Notes (Signed)
S:     Chief Complaint  Patient presents with   Medication Management    T2DM f/u - Liberate Enrollment   58 y.o. female who presents for diabetes evaluation, education, and management in the context of the LIBERATE Study. Patient arrives in good spirits and presents without any assistance.   Patient was referred and last seen by Primary Care Provider, Dr. [name], on 08/23/23.   PMH is significant for HLD, T2DM, HTN.  At last visit, patient was recently prescribed Lantus (insulin glargine) 10 units daily and instructed to check CBG TID.    Patient reports Diabetes was diagnosed in 12/01/2006 per Dr. Bea Graff note back in 04/2022.   Family history: father (HLD, HTN, T2DM), mother (HlD, HTN, breast cancer, T2DM)  Current diabetes medications include: Lantus (insulin glargine) 10 units daily Current hypertension medications include: olmesartan-hydrochlorothiazide (BENICAR HCT) 40-12.5 MG daily,  Current hyperlipidemia medications include: rosuvastatin (CRESTOR) 40 MG  Patient reports adherence with all medications but denies adherence with rosuvastatin, reports last dose taken on 01/2023. Will send refills for rosuvastatin. Patient agrees to restart and remain adherent to rosuvastatin.   Do you feel that your medications are working for you? yes Have you been experiencing any side effects to the medications prescribed? no Do you have any problems obtaining medications due to transportation or finances? no Insurance coverage: [employer]   Patient denies hypoglycemic events.  Reported home fasting blood sugars: 218-319 fasting BG   Patient denies nocturia (nighttime urination).  Patient denies neuropathy (nerve pain). Patient reports mild visual changes. Patient reports self foot exams.   Patient reported dietary habits: Eats 3 meals/day Breakfast: 2 boiled eggs, bacon 2pcs, 1 pc toast/waffle Lunch: salad, protein Dinner: salad/vegetables, protein Snacks: Honey  buns Drinks: sweet tea, lemonade  Within the past 12 months, did you worry whether your food would run out before you got money to buy more? no Within the past 12 months, did the food you bought run out, and you didn't have money to get more? no  Patient-reported exercise habits: 11,000 steps 5 days a week   O:   Review of Systems  Constitutional: Negative.   HENT: Negative.    Respiratory: Negative.    Cardiovascular: Negative.   Neurological: Negative.     Physical Exam Constitutional:      Appearance: Normal appearance.  Pulmonary:     Effort: Pulmonary effort is normal.  Neurological:     Mental Status: She is alert.  Psychiatric:        Mood and Affect: Mood normal.        Behavior: Behavior normal.        Thought Content: Thought content normal.       Lab Results  Component Value Date   HGBA1C 12.4 (A) 08/15/2023   Vitals:   08/25/23 1026  BP: 138/60  Pulse: 80  SpO2: 99%    Lipid Panel     Component Value Date/Time   CHOL 178 10/24/2019 1431   TRIG 165 (H) 10/24/2019 1431   HDL 49 10/24/2019 1431   CHOLHDL 3.6 10/24/2019 1431   CHOLHDL 2.4 04/04/2017 1259   VLDL 27 08/08/2016 1041   LDLCALC 100 (H) 10/24/2019 1431   LDLCALC 60 04/04/2017 1259   LDLDIRECT 72 10/29/2021 1622    Clinical Atherosclerotic Cardiovascular Disease (ASCVD): No  The ASCVD Risk score (Arnett DK, et al., 2019) failed to calculate for the following reasons:   Cannot find a previous HDL lab  Cannot find a previous total cholesterol lab    A/P: LIBERATE Study:  -Patient provided verbal consent to participate in the study. Consent documented in electronic medical record.  -Provided education on Libre 3 CGM. Collaborated to ensure Josephine Igo 3 app was downloaded on patient's phone. Educated on how to place sensor every 14 days, patient placed first sensor correctly and verbalized understanding of use, removal, and how to place next sensor. Discussed alarms. 8 sensors provided  for a 3 month supply. Educated to contact the office if the sensor falls off early and replacements are needed before their next [employer].   Diabetes longstanding currently uncontrolled. Patient is able to verbalize appropriate hypoglycemia management plan. Medication adherence appears optimal for all medications except rosuvastatin (patient stopped taking mid 2024 after running out of refills). Control is suboptimal due to A1C from 08/15/23 at 12.4 and above target morning fasting glucose ranging from 218-319. -Increased dose of basal insulin Lantus (insulin glargine) from 10 units to 14 units daily in the morning. Patient will continue to titrate 1 unit everyday if fasting blood sugar > 150mg /dl until fasting blood sugars reach goal or next visit.  -Extensively discussed pathophysiology of diabetes, recommended lifestyle interventions, dietary effects on blood sugar control.  -Counseled on s/sx of and management of hypoglycemia.  -Next A1c anticipated 12/2023.   ASCVD risk - primary prevention in patient with diabetes. Last LDL is 72 (10/2021) not at goal of <70 mg/dL. ASCVD risk factors include HTN/T2DM and 10-year ASCVD risk score of 16.8%. High intensity statin indicated.  -Restarted rosuvastatin 40 mg. daily   Hypertension longstanding currently controlled. Blood pressure goal of <130/80 mmHg. Medication adherence optimal.  -Continue olmesartan-hydrochlorothiazide (BENICAR HCT) 40-12.5 MG daily  Written patient instructions provided. Patient verbalized understanding of treatment plan.  Total time in face to face counseling 45 minutes.    Follow-up:  Pharmacist (Dr. Raymondo Band) on 09/08/23 at 3:30 PM PCP clinic visit in 09/08/23 at 4:10 PM with Dr. Cyndia Skeeters Patient seen with Lavona Mound, PharmD Candidate and Bing Plume, PharmD Candidate.

## 2023-08-25 NOTE — Assessment & Plan Note (Signed)
ASCVD risk - primary prevention in patient with diabetes. Last LDL is 72 (10/2021) not at goal of <70 mg/dL. ASCVD risk factors include HTN/T2DM and 10-year ASCVD risk score of 16.8%. High intensity statin indicated.  -Restarted rosuvastatin 40 mg. daily

## 2023-08-25 NOTE — Patient Instructions (Addendum)
It was nice to see you today!  Your goal blood sugar is 80-130 before eating and less than 150 after eating.  Medication Changes: Increase insulin glargine to 14 units daily. Add 1 unit (up to 30 units max) if your morning fasting sugar is greater than 140.   Start taking rosuvastatin 40 mg once daily again.   Continue all other medication the same.   Your next PCP appointment is with Dr. Cyndia Skeeters on 09/08/23 at 4:10 pm. Dr. Raymondo Band will see you on the same day (09/08/23) at 3:30 pm.   Monitor blood sugars at home and keep a log (glucometer or piece of paper) to bring with you to your next visit.  Keep up the good work with diet and exercise. Aim for a diet full of vegetables, fruit and lean meats (chicken, [place], fish). Try to limit salt intake by eating fresh or frozen vegetables (instead of canned), rinse canned vegetables prior to cooking and do not add any additional salt to meals.   Sensor Application If using the App, you can tap Help in the Main Menu to access an in-app tutorial on applying a Sensor. See below for instructions on how to download the app. Apply Sensors only on the back of your upper arm. If placed in other areas, the Sensor may not function properly and could give you inaccurate readings. Avoid areas with scars, moles, stretch marks, or lumps.   Select an area of skin that generally stays flat during your normal daily activities (no bending or folding). Choose a site that is at least 1 inch (2.5 cm) away from any injection sites. To prevent discomfort or skin irritation, you should select a different site other than the one most recently used. Wash application site using a plain soap, dry, and then clean with an alcohol wipe. This will help remove any oily residue that may prevent the sensor from sticking properly. Allow site to air dry before proceeding. Note: The area MUST be clean and dry, or the Sensor may not stay on for the full wear duration specified by your  Sensor insert. 4. Unscrew the cap from the Sensor Applicator and set the cap aside.  5. Place the Sensor Applicator over the prepared site and push down firmly to apply the Sensor to your body. 6. Gently pull the Sensor Applicator away from your body. The Sensor should now be attached to your skin. 7. Make sure the Sensor is secure after application. Put the cap back on the Sensor Applicator. Discard the used Engineer, agricultural according to local regulations.  What If My Sensor Falls Off or What If My Sensor Isn't Working? Call [employer] Customer Care Team at [phone] Available 7 days a week from 8AM-8PM EST, excluding holidays If yo have multiple sensors fall off prior to 14 days of use, contact [facility] at [phone]   The App Download the FreeStyle [place] 3 App in your phone's app store   Load the app and select get started now Create an account  Tap scan new sensor Follow the prompts on the screen. If your sensor does not sync, try moving your phone slowly around the sensor. Phone cases may affect scanning. This will be the only time you have to scan the sensor until you apply a new sensor in 14 days.  There will be a 60 minute start up period until the app will display your glucose reading   How To Share Your Readings With [place] Once in  the app, go to settings -> connected apps -> LibreView -> Enter Practice ID -> [id number]

## 2023-09-08 ENCOUNTER — Encounter: Payer: Self-pay | Admitting: Pharmacist

## 2023-09-08 ENCOUNTER — Ambulatory Visit: Payer: 59 | Admitting: Pharmacist

## 2023-09-08 ENCOUNTER — Ambulatory Visit: Payer: 59 | Admitting: Family Medicine

## 2023-09-08 VITALS — BP 125/80 | HR 93 | Wt 160.8 lb

## 2023-09-08 DIAGNOSIS — E119 Type 2 diabetes mellitus without complications: Secondary | ICD-10-CM

## 2023-09-08 DIAGNOSIS — Z0000 Encounter for general adult medical examination without abnormal findings: Secondary | ICD-10-CM

## 2023-09-08 DIAGNOSIS — E1169 Type 2 diabetes mellitus with other specified complication: Secondary | ICD-10-CM | POA: Diagnosis not present

## 2023-09-08 DIAGNOSIS — E785 Hyperlipidemia, unspecified: Secondary | ICD-10-CM | POA: Diagnosis not present

## 2023-09-08 DIAGNOSIS — E1165 Type 2 diabetes mellitus with hyperglycemia: Secondary | ICD-10-CM

## 2023-09-08 MED ORDER — INSULIN PEN NEEDLE 32G X 4 MM MISC: 11 refills | Status: DC

## 2023-09-08 MED ORDER — INSULIN GLARGINE 100 UNIT/ML SOLOSTAR PEN: 26.0000 [IU] | PEN_INJECTOR | SUBCUTANEOUS | 11 refills | Status: DC

## 2023-09-08 NOTE — Progress Notes (Signed)
    SUBJECTIVE:   CHIEF COMPLAINT / HPI:   T2DM Started taking insulin 2 weeks ago. Doing well. Has CGM in place and checks values on her phone, values range from 120s-350s. Started at 10 units long acting insulin daily and was instructed to go up 1 unit daily - she reports she is on 19 units daily now. No hypoglycemic episodes, no lightheadedness, HA, dizzy spells. She has been eating better and walking daily.  PERTINENT  PMH / PSH:  HTN HLD  OBJECTIVE:   BP 125/80   Pulse 93   Wt 160 lb 12.8 oz (72.9 kg)   SpO2 96%   BMI 29.41 kg/m   General: well-appearing, no acute distress. HEENT: normocephalic, PERRLA, EOM grossly intact, MMM. Cardio: Regular rate, regular rhythm, no murmurs on exam. Pulm: Clear, no wheezing, no crackles. No increased work of breathing. Abdominal: bowel sounds present, soft, non-tender, non-distended. Extremities: no peripheral edema. Moves all extremities equally. Neuro: Alert and oriented x3, speech normal in content, no facial asymmetry. Psych:  Cognition and judgment appear intact. Alert, communicative, and cooperative with normal attention span and concentration.   ASSESSMENT/PLAN:   Uncontrolled diabetes mellitus with hyperglycemia, without long-term current use of insulin (HCC) Uncontrolled.  Last A1c 12.4 (08/15/2023).  Started insulin approximately 2 weeks ago. - Reviewed CGM records with Dr. Raymondo Band - Plan to start 26 units of long-acting insulin tomorrow; then increase by 1 unit each day - Follow-up in 2 weeks with Dr. Raymondo Band - Plan to repeat A1c in approximately 2 months -Plan for other diabetic labs in 2 weeks when patient follows up for Pap smear  Hyperlipidemia associated with type 2 diabetes mellitus (HCC) - Lipid panel in 2 weeks when patient returns for Pap smear  Healthcare maintenance - Patient to schedule mammogram, instructions provided - Patient to schedule diabetic eye exam - Patient due for Pap smear; scheduled for  09/22/2023, and will obtain labs due at that time - Diabetic foot exam today without concern    Cyndia Skeeters, DO [facility]

## 2023-09-08 NOTE — Assessment & Plan Note (Signed)
-   Lipid panel in 2 weeks when patient returns for Pap smear

## 2023-09-08 NOTE — Assessment & Plan Note (Signed)
-   Patient to schedule mammogram, instructions provided - Patient to schedule diabetic eye exam - Patient due for Pap smear; scheduled for 09/22/2023, and will obtain labs due at that time - Diabetic foot exam today without concern

## 2023-09-08 NOTE — Assessment & Plan Note (Addendum)
 Uncontrolled.  Last A1c 12.4 (08/15/2023).  Started insulin approximately 2 weeks ago. - Reviewed CGM records with Dr. Raymondo Band - Plan to start 26 units of long-acting insulin tomorrow; then increase by 1 unit each day - Follow-up in 2 weeks with Dr. Raymondo Band - Plan to repeat A1c in approximately 2 months -Plan for other diabetic labs in 2 weeks when patient follows up for Pap smear

## 2023-09-08 NOTE — Patient Instructions (Signed)
 Dear Cheryl Carroll, it was great to see you today!  Today we discussed the following concerns and plans:  Diabetes: - your blood sugars are getting somewhat better, but we need to continue to adjust your insulin to get the best control - Please start taking 26 units daily, and continue to increase your dose by 1 unit each day.  You are also due for a mammogram: You can call to schedule an appointment by calling [phone]. [address] [place], [place] [address] Please let me know if you have questions. I will send you a letter or call you with results.   Please don't forget to schedule your diabetic eye exam and ask the office to send [place] the records.  If you have any concerns, please call the clinic or schedule an appointment.  It was a pleasure to take care of you today. Be well!  Cyndia Skeeters, DO [facility], PGY-1

## 2023-09-08 NOTE — Assessment & Plan Note (Signed)
 Diabetes longstanding since diagnosis in 2008 and recently with worsened control (A1C 12.4)  Recently improved with use of basal insulin. Patient is able to verbalize appropriate hypoglycemia management plan. Medication adherence appears good.  -Increased dose of basal insulin Lantus (insulin glargine) from 19 to 26 units once daily starting tomorrow AM.   Patient will continue to titrate 1 unit every day if fasting blood sugar > 150mg /dl or next visit.

## 2023-09-08 NOTE — Progress Notes (Signed)
    S:     Chief Complaint  Patient presents with   Medication Management    Diabetes - CGM reset - new phone   58 y.o. female who presents for diabetes evaluation, education, and management. Patient arrives in good spirits and presents without any assistance.   Patient was referred and last seen by Primary Care Provider, Dr. Mliss Sax, Today.    PMH is significant for Diabetes At last visit, CGM was initiated.     Current diabetes medications include: Lantus (insulin glargine) 19 units once daily CGM has been helpful and was used without issue.   However, patient has NEW PHONE today and we spent majority of visit with CGM app installation and sensor linking.   Patient denies hypoglycemic events.   O:   Review of Systems  All other systems reviewed and are negative.   Physical Exam Pulmonary:     Effort: Pulmonary effort is normal.  Neurological:     Mental Status: She is alert.  Psychiatric:        Mood and Affect: Mood normal.        Thought Content: Thought content normal.       Libre3 CGM Download today 09/08/2023 % Time CGM is active: 96% Average Glucose: 228 mg/dL Glucose Management Indicator: 8.8  Glucose Variability: 15.6% (goal <36%) Time in Goal:  - Time in range 70-180: 8% - Time above range: 92%  (70% are 180-250) - Time below range: 0% Observed patterns:   Lab Results  Component Value Date   HGBA1C 12.4 (A) 08/15/2023     A/P: Diabetes longstanding since diagnosis in 2008 and recently with worsened control (A1C 12.4)  Recently improved with use of basal insulin. Patient is able to verbalize appropriate hypoglycemia management plan. Medication adherence appears good.  -Increased dose of basal insulin Lantus (insulin glargine) from 19 to 26 units once daily starting tomorrow AM.   Patient will continue to titrate 1 unit every day if fasting blood sugar > 150mg /dl or next visit.  -Extensively discussed pathophysiology of diabetes, recommended  lifestyle interventions, dietary effects on blood sugar control.  -Counseled on s/sx of and management of hypoglycemia.  NEW PHONE today and we spent majority of visit with CGM app installation and sensor linking.    Written patient instructions provided. Patient verbalized understanding of treatment plan.  Total time in face to face counseling 27 minutes.    Follow-up:  Pharmacist 09/22/2023 PCP clinic visit in 09/22/2023

## 2023-09-08 NOTE — Patient Instructions (Signed)
 It was nice to see you today!  Your goal blood sugar is 80-130 before eating and less than 180 after eating.  Medication Changes: Increase Lantus to 26 units tomorrow AM then increase 1 unit per day until you see glucose readings< 150 or you reach 40 units per day.    Continue all other medication the same.     Keep up the good work with diet and exercise. Aim for a diet full of vegetables, fruit and lean meats (chicken, [place], fish). Try to limit salt intake by eating fresh or frozen vegetables (instead of canned), rinse canned vegetables prior to cooking and do not add any additional salt to meals.

## 2023-09-08 NOTE — Addendum Note (Signed)
 Addended by: Cyndia Skeeters on: 09/08/2023 05:02 PM   Modules accepted: Orders

## 2023-09-10 ENCOUNTER — Encounter (HOSPITAL_COMMUNITY): Payer: Self-pay

## 2023-09-10 ENCOUNTER — Emergency Department (HOSPITAL_COMMUNITY)

## 2023-09-10 ENCOUNTER — Emergency Department (HOSPITAL_COMMUNITY)
Admission: EM | Admit: 2023-09-10 | Discharge: 2023-09-10 | Disposition: A | Discharge status: Home / Self Care | Attending: Emergency Medicine | Admitting: Emergency Medicine

## 2023-09-10 ENCOUNTER — Emergency Department (HOSPITAL_BASED_OUTPATIENT_CLINIC_OR_DEPARTMENT_OTHER)

## 2023-09-10 ENCOUNTER — Other Ambulatory Visit: Payer: Self-pay

## 2023-09-10 DIAGNOSIS — M79604 Pain in right leg: Secondary | ICD-10-CM | POA: Diagnosis present

## 2023-09-10 DIAGNOSIS — M25551 Pain in right hip: Secondary | ICD-10-CM | POA: Insufficient documentation

## 2023-09-10 DIAGNOSIS — M79661 Pain in right lower leg: Secondary | ICD-10-CM

## 2023-09-10 LAB — CBG MONITORING, ED: Glucose-Capillary: 236 mg/dL — ABNORMAL HIGH (ref 70–99)

## 2023-09-10 MED ORDER — METHOCARBAMOL 500 MG PO TABS: 500.0000 mg | ORAL_TABLET | Freq: Four times a day (QID) | ORAL | 0 refills | Status: DC

## 2023-09-10 MED ORDER — LIDOCAINE 5 % EX PTCH
1.0000 | MEDICATED_PATCH | Freq: Once | CUTANEOUS | Status: DC
  Administered 2023-09-10: 1 via TRANSDERMAL
  Filled 2023-09-10: qty 1

## 2023-09-10 MED ORDER — MELOXICAM 15 MG PO TABS: 15.0000 mg | ORAL_TABLET | Freq: Every day | ORAL | 0 refills | Status: AC

## 2023-09-10 NOTE — Discharge Instructions (Addendum)
Folllow up with your Physician for recheck.  Return if any problems.

## 2023-09-10 NOTE — ED Provider Triage Note (Signed)
 Emergency Medicine Provider Triage Evaluation Note  Cheryl Carroll , a 58 y.o. female  was evaluated in triage.  Pt complains of right lateral thigh pain that began 3 days ago.  No leg swelling.  States that she was googling online and is afraid she has a blood clot.  No recent travels, surgeries, or hospitalizations.  Review of Systems  Positive: Right leg pain Negative: Swelling  Physical Exam  BP (!) 141/74 (BP Location: Left Arm)   Pulse 89   Temp 98.1 F (36.7 C)   Resp 16   SpO2 100%  Gen:   Awake, no distress   Resp:  Normal effort  MSK:   Moves extremities without difficulty  Other:    Medical Decision Making  Medically screening exam initiated at 1:54 PM.  Appropriate orders placed.  Cheryl Carroll was informed that the remainder of the evaluation will be completed by another provider, this initial triage assessment does not replace that evaluation, and the importance of remaining in the ED until their evaluation is complete.   Maxwell Marion, PA-C 09/10/23 1358

## 2023-09-10 NOTE — Progress Notes (Signed)
 VASCULAR LAB    Right lower extremity venous duplex has been performed.  See CV proc for preliminary results.  Relayed results to Maxwell Marion, PA-C via secure chat  Sherren Kerns, RVT 09/10/2023, 5:09 PM

## 2023-09-10 NOTE — ED Triage Notes (Signed)
 Pt presents with R hip pain that radiates down the anterior and lateral thigh x 1 week. She denies any injury or new activities. Pain get worse throughout the day with activity and periods of prolonged sitting. She has tried hot showers without relief. She has not tried any OTC medications.

## 2023-09-10 NOTE — ED Provider Notes (Signed)
 [place] EMERGENCY DEPARTMENT AT [facility] Provider Note   CSN: [id number] Arrival date & time: 09/10/23  1340     History  Chief Complaint  Patient presents with   Leg Pain    Carroll Cheryl is a 58 y.o. female.  Patient complains of pain in her right hip and her right leg.  Patient reports pain in the back of her right upper leg.  Patient denies any injuries.  She reports that she has had this discomfort for the past week.  Patient complains of pain with walking.  Patient reports that she has had some relief with taking a hot shower.  Patient has not taken any medications.  The history is provided by the patient. No language interpreter was used.  Leg Pain      Home Medications Prior to Admission medications   Medication Sig Start Date End Date Taking? Authorizing Provider  meloxicam (MOBIC) 15 MG tablet Take 1 tablet (15 mg total) by mouth daily. 09/10/23 09/09/24 Yes Elson Areas, PA-C  methocarbamol (ROBAXIN) 500 MG tablet Take 1 tablet (500 mg total) by mouth 4 (four) times daily. 09/10/23  Yes Elson Areas, PA-C  acetaminophen (TYLENOL) 500 MG tablet Take 500 mg by mouth every 6 (six) hours as needed for moderate pain or headache.    [provider]  blood glucose meter kit and supplies KIT Dispense based on patient and insurance preference. Use up to four times daily as directed. Patient not taking: Reported on 08/15/2023 09/01/20   Cheryl Carroll C, DO  Blood Glucose Monitoring Suppl [facility] VERIO) w/Device KIT Use to check blood sugar 3 times daily 08/22/23   Cheryl Mans, MD  fluticasone [facility]) 50 MCG/ACT nasal spray Place 2 sprays into both nostrils 2 (two) times daily as needed for allergies or rhinitis. 1 spray in each nostril every day 08/25/23   McDiarmid, Leighton Roach, MD  glucose blood (ONETOUCH VERIO) test strip Use to check blood sugar 3 times daily 08/22/23   Cheryl Mans, MD  insulin glargine (LANTUS) 100 UNIT/ML Solostar Pen Inject  26-40 Units into the skin every morning. 09/08/23   Cyndia Skeeters, DO  Insulin Pen Needle 32G X 4 MM MISC Use as directed with daily insulin injections. 09/08/23   Cyndia Skeeters, DO  Lancet Devices (ONE TOUCH DELICA LANCING DEV) MISC Use to check blood sugar 3 times daily 08/22/23   Cheryl Mans, MD  olmesartan-hydrochlorothiazide [facility] HCT) 40-12.5 MG tablet TAKE 1 TABLET BY MOUTH DAILY 09/27/22   Cheryl Close, MD  OneTouch Delica Lancets 33G MISC Use to check blood sugar 3 times daily 08/22/23   Cheryl Mans, MD  rosuvastatin (CRESTOR) 40 MG tablet Take 1 tablet (40 mg total) by mouth daily. 08/25/23   McDiarmid, Leighton Roach, MD      Allergies    Metformin and related    Review of Systems   Review of Systems  All other systems reviewed and are negative.   Physical Exam Updated Vital Signs BP 137/66 (BP Location: Left Arm)   Pulse 85   Temp 98.1 F (36.7 C) (Oral)   Resp 14   Ht 5\' 2"  (1.575 m)   Wt 72.6 kg   SpO2 100%   BMI 29.26 kg/m  Physical Exam Vitals and nursing note reviewed.  Constitutional:      Appearance: She is well-developed.  HENT:     Head: Normocephalic.  Cardiovascular:     Rate and Rhythm: Normal rate.  Pulmonary:  Effort: Pulmonary effort is normal.  Abdominal:     General: There is no distension.  Musculoskeletal:        General: Tenderness present. No swelling.     Cervical back: Normal range of motion.     Comments: Tender right upper posterior leg, full range of motion hip and knee.  Neurovascular and neurosensory intact.  Skin:    General: Skin is warm.  Neurological:     General: No focal deficit present.     Mental Status: She is alert and oriented to person, place, and time.     ED Results / Procedures / Treatments   Labs (all labs ordered are listed, but only abnormal results are displayed) Labs Reviewed  CBG MONITORING, ED - Abnormal; Notable for the following components:      Result Value   Glucose-Capillary 236 (*)     All other components within normal limits    EKG None  Radiology VAS [place] LOWER EXTREMITY VENOUS (DVT) (ONLY [facility] & [facility]) Result Date: 09/10/2023  Lower Venous DVT Study Patient Name:  Cheryl Carroll  Date of Exam:   09/10/2023 Medical Rec #: [id number]        Accession #:    [id number] Date of Birth: August 27, 1965       Patient Gender: F Patient Age:   81 years Exam Location:  [facility] Procedure:      VAS [place] LOWER EXTREMITY VENOUS (DVT) Referring Phys: Maxwell Marion --------------------------------------------------------------------------------  Indications: Lateral hip and thigh pain.  Comparison Study: No prior study on file Performing Technologist: Cheryl Carroll RVS  Examination Guidelines: A complete evaluation includes B-mode imaging, spectral Doppler, color Doppler, and power Doppler as needed of all accessible portions of each vessel. Bilateral testing is considered an integral part of a complete examination. Limited examinations for reoccurring indications may be performed as noted. The reflux portion of the exam is performed with the patient in reverse Trendelenburg.  +---------+---------------+---------+-----------+----------+--------------+ RIGHT    CompressibilityPhasicitySpontaneityPropertiesThrombus Aging +---------+---------------+---------+-----------+----------+--------------+ CFV      Full           Yes      Yes                                 +---------+---------------+---------+-----------+----------+--------------+ SFJ      Full                                                        +---------+---------------+---------+-----------+----------+--------------+ FV Prox  Full                                                        +---------+---------------+---------+-----------+----------+--------------+ FV Mid   Full                                                        +---------+---------------+---------+-----------+----------+--------------+ FV  DistalFull                                                        +---------+---------------+---------+-----------+----------+--------------+  PFV      Full                                                        +---------+---------------+---------+-----------+----------+--------------+ POP      Full           Yes      Yes                                 +---------+---------------+---------+-----------+----------+--------------+ PTV      Full                                                        +---------+---------------+---------+-----------+----------+--------------+ PERO     Full                                                        +---------+---------------+---------+-----------+----------+--------------+ ATV      Full                                                        +---------+---------------+---------+-----------+----------+--------------+   +----+---------------+---------+-----------+----------+--------------+ LEFTCompressibilityPhasicitySpontaneityPropertiesThrombus Aging +----+---------------+---------+-----------+----------+--------------+ CFV Full           Yes      Yes                                 +----+---------------+---------+-----------+----------+--------------+     Summary: RIGHT: - There is no evidence of deep vein thrombosis in the lower extremity.  - No cystic structure found in the popliteal fossa.  LEFT: - No evidence of common femoral vein obstruction.   *See table(s) above for measurements and observations.    Preliminary    DG Hip Unilat W or Wo Pelvis 2-3 Views Right Result Date: 09/10/2023 CLINICAL DATA:  Right hip pain, no reported injury EXAM: DG HIP (WITH OR WITHOUT PELVIS) 2-3V RIGHT COMPARISON:  04/03/2019 right hip radiographs FINDINGS: No pelvic fracture or diastasis. No right hip fracture or dislocation. No suspicious focal osseous lesions. Mild degenerative changes at the symphysis pubis. Small enthesophytes at  the superior greater trochanters bilaterally. Mild left hip osteoarthritis with subchondral cyst superiorly. Mild lower lumbar degenerative changes. IMPRESSION: 1. No acute osseous abnormality. 2. Mild left hip osteoarthritis. 3. Mild lower lumbar degenerative changes. Electronically Signed   By: Delbert Phenix M.D.   On: 09/10/2023 15:02    Procedures Procedures    Medications Ordered in ED Medications  lidocaine (LIDODERM) 5 % 1 patch (1 patch Transdermal Patch Applied 09/10/23 1420)    ED Course/ Medical Decision Making/ A&P  Medical Decision Making Patient complains of pain in her right upper leg for the past week  Amount and/or Complexity of Data Reviewed Independent Historian:     Details: Patient is here with family who is supportive Radiology: ordered and independent interpretation performed. Decision-making details documented in ED Course.    Details: Patient complains of pain in her x-ray right hip shows mild osteoarthritis. Vascular ultrasound shows no evidence of DVT.  Risk Prescription drug management.           Final Clinical Impression(s) / ED Diagnoses Final diagnoses:  Right leg pain    Rx / DC Orders ED Discharge Orders          Ordered    methocarbamol (ROBAXIN) 500 MG tablet  4 times daily        09/10/23 2023    meloxicam (MOBIC) 15 MG tablet  Daily        09/10/23 2023           An After Visit Summary was printed and given to the patient.    Elson Areas, PA-C 09/10/23 2321    Loetta Rough, MD 09/10/23 [phone]

## 2023-09-11 NOTE — Progress Notes (Signed)
 Reviewed and agree with Dr Macky Lower plan.

## 2023-09-15 NOTE — Progress Notes (Signed)
 Reviewed and agree with Dr Macky Lower plan.

## 2023-09-18 NOTE — Progress Notes (Signed)
 Reviewed and agree with Dr Macky Lower plan.

## 2023-09-22 ENCOUNTER — Encounter: Payer: Self-pay | Admitting: Pharmacist

## 2023-09-22 ENCOUNTER — Ambulatory Visit: Admitting: Pharmacist

## 2023-09-22 ENCOUNTER — Ambulatory Visit (INDEPENDENT_AMBULATORY_CARE_PROVIDER_SITE_OTHER): Payer: Self-pay | Admitting: Family Medicine

## 2023-09-22 VITALS — BP 116/64 | HR 81 | Wt 163.0 lb

## 2023-09-22 DIAGNOSIS — R051 Acute cough: Secondary | ICD-10-CM

## 2023-09-22 DIAGNOSIS — E785 Hyperlipidemia, unspecified: Secondary | ICD-10-CM

## 2023-09-22 DIAGNOSIS — E1165 Type 2 diabetes mellitus with hyperglycemia: Secondary | ICD-10-CM

## 2023-09-22 DIAGNOSIS — E1169 Type 2 diabetes mellitus with other specified complication: Secondary | ICD-10-CM

## 2023-09-22 MED ORDER — FLUTICASONE PROPIONATE 50 MCG/ACT NA SUSP
2.0000 | Freq: Two times a day (BID) | NASAL | 4 refills | Status: DC | PRN
Start: 2023-09-22 — End: 2023-11-14

## 2023-09-22 MED ORDER — INSULIN PEN NEEDLE 32G X 4 MM MISC
11 refills | Status: AC
Start: 2023-09-22 — End: ?

## 2023-09-22 MED ORDER — INSULIN GLARGINE 100 UNIT/ML SOLOSTAR PEN: 24.0000 [IU] | PEN_INJECTOR | SUBCUTANEOUS | Status: DC

## 2023-09-22 NOTE — Assessment & Plan Note (Signed)
 Diabetes longstanding 17 years (2008) currently slightly improved. Patient is able to verbalize appropriate hypoglycemia management plan. Due to patient's reported concerns of hypoglycemia, patient was instructed to call in the event that she feels symptomatic or glucose levels < 70 mg/dL. Medication adherence appears good, although patient reports not increasing insulin dose, currently taking insulin glargine (Lantus) 18 units daily. Control is suboptimal due to low dose of insulin. Patient reports wanting to lose weight (her goal 130 lbs) and instructed to focus on dietary changes to reach goal. -Increased dose of basal insulin Lantus (insulin glargine) 18 units to 24 units daily in the morning.   -Patient educated on purpose, proper use, and potential adverse effects.  -Extensively discussed pathophysiology of diabetes, recommended lifestyle interventions, dietary effects on blood sugar control.  -Counseled on s/sx of and management of hypoglycemia.  -Next A1c anticipated May 2025.

## 2023-09-22 NOTE — Progress Notes (Signed)
 Pt scheduled for pap smear but declined today, did not need to see physician. Completed labs ordered for lab visit and pt prefers to reschedule pap smear in the future.

## 2023-09-22 NOTE — Progress Notes (Signed)
 S:     Chief Complaint  Patient presents with   Medication Management    Diabetes f/u   58 y.o. female who presents for diabetes evaluation, education, and management. Patient arrives in good spirits and presents without any assistance.  Patient was referred and last seen by Primary Care Provider, Dr. Mliss Sax, on 09/08/23.   PMH is significant for diabetes.  At last visit, insulin glargine (Lantus) was increased from 19 to 26 units once daily. Patient instructed to continue to titrate 1 unit every day if fasting blood sugar > 150mg /dl or next visit.   Current diabetes medications include: insulin glargine (Lantus) 18 units daily Current hypertension medications include: olmesartan-hydrochlorothiazide 40-12.5 mg Current hyperlipidemia medications include: rosuvastatin 40 mg  Patient reports adherence to taking most medications as prescribed. Patient reports taking insulin glargine (Lantus) near previous dose, currently taking 18 units daily, due to concerns of a larger dose increase and confusion around when she should be concerned of hypoglycemic events.  Do you feel that your medications are working for you? yes Have you been experiencing any side effects to the medications prescribed? no Do you have any problems obtaining medications due to transportation or finances? no Insurance coverage: [employer]  Patient denies hypoglycemic events.  Patient reported dietary habits: Reports having better control of her portioning meal size to maintain glucose levels. Breakfast: english muffin Snacks: peanut butter Drinks: zero-sugar soda  O:   Review of Systems  All other systems reviewed and are negative.   Physical Exam Constitutional:      Appearance: Normal appearance.  Pulmonary:     Effort: Pulmonary effort is normal.  Neurological:     Mental Status: She is alert.  Psychiatric:        Mood and Affect: Mood normal.        Behavior: Behavior normal.        Thought  Content: Thought content normal.        Judgment: Judgment normal.    Libre3 CGM Download today 09/22/23 % Time CGM is active: 91% Average Glucose: 211 mg/dL Glucose Management Indicator: 8.4  Glucose Variability: 20.5% (goal <36%) Time in Goal:  - Time in range 70-180: 25% - Time above range: 75% - Time below range: 0% Observed patterns: Persistently elevated hyperglycemia   Lab Results  Component Value Date   HGBA1C 12.4 (A) 08/15/2023   Vitals:   09/22/23 1112  BP: 116/64  Pulse: 81  SpO2: 98%    Lipid Panel     Component Value Date/Time   CHOL 178 10/24/2019 1431   TRIG 165 (H) 10/24/2019 1431   HDL 49 10/24/2019 1431   CHOLHDL 3.6 10/24/2019 1431   CHOLHDL 2.4 04/04/2017 1259   VLDL 27 08/08/2016 1041   LDLCALC 100 (H) 10/24/2019 1431   LDLCALC 60 04/04/2017 1259   LDLDIRECT 72 10/29/2021 1622    A/P: Diabetes longstanding 17 years (2008) currently slightly improved. Patient is able to verbalize appropriate hypoglycemia management plan. Due to patient's reported concerns of hypoglycemia, patient was instructed to call in the event that she feels symptomatic or glucose levels < 70 mg/dL. Medication adherence appears good, although patient reports not increasing insulin dose, currently taking insulin glargine (Lantus) 18 units daily. Control is suboptimal due to low dose of insulin. Patient reports wanting to lose weight (her goal 130 lbs) and instructed to focus on dietary changes to reach goal. -Increased dose of basal insulin Lantus (insulin glargine) 18 units to 24 units daily  in the morning.   -Patient educated on purpose, proper use, and potential adverse effects.  -Extensively discussed pathophysiology of diabetes, recommended lifestyle interventions, dietary effects on blood sugar control.  -Counseled on s/sx of and management of hypoglycemia.  -Next A1c anticipated May 2025.   ASCVD risk - primary secondary prevention in patient with diabetes. Last LDL is  72 not at goal of <81 mg/dL. ASCVD risk factors include T2DM, HLD and 10-year ASCVD risk score of 16.8%. high intensity statin indicated.  -Continued rosuvastatin 40 mg.   Hypertension longstanding currently controlled. Blood pressure goal of <130/80 mmHg. Medication adherence good. -Continue olmesartan-hydrochlorothiazide (BENICAR HCT) 40-12.5 MG daily   Written patient instructions provided. Patient verbalized understanding of treatment plan.  Total time in face to face counseling 32 minutes.    Follow-up:  Pharmacist Dr. Raymondo Band 10/18/23 PCP clinic visit PRN Patient seen with Threasa Heads, PharmD Candidate and Mack Guise, PharmD Candidate.

## 2023-09-22 NOTE — Progress Notes (Deleted)
    SUBJECTIVE:   CHIEF COMPLAINT / HPI:   *** Due for Pap  Needs labs today***  PERTINENT  PMH / PSH: Reviewed  OBJECTIVE:   There were no vitals taken for this visit.  *** General: ***-appearing, no acute distress. HEENT: normocephalic, PERRLA, MMM, bilateral TM visualized without erythema or bulging. Cardio: Regular rate, *** rhythm, no murmurs on exam. Pulm: Clear, no wheezing, no crackles. No increased work of breathing. Abdominal: bowel sounds present, soft, non-tender, non-distended. Extremities: no peripheral edema. Moves all extremities equally. Neuro: Alert and oriented x3, speech normal in content, no facial asymmetry, strength intact and equal bilaterally in UE and LE, pupils equal and reactive to light.  Psych:  Cognition and judgment appear intact. Alert, communicative, and cooperative with normal attention span and concentration. No apparent delusions, illusions, hallucinations   GU Exam:  External exam: Normal-appearing female external genitalia.   Vaginal exam notable for ***.  Cervix without discharge or obvious lesion.  Bimanual exam reveals normal sized uterus no masses appreciated, no pain with examination.***  Chaperoned exam, CMA ***.    ASSESSMENT/PLAN:   No problem-specific Assessment & Plan notes found for this encounter.     Cyndia Skeeters, DO [facility]

## 2023-09-22 NOTE — Patient Instructions (Signed)
 It was nice to see you today!  Your goal blood sugar is 80-130 before eating and less than 180 after eating.  Medication Changes: Increase Lantus from 18 to 24 units daily in the morning.  Continue all other medication the same.   Keep up the good work with diet and exercise. Aim for a diet full of vegetables, fruit and lean meats (chicken, [place], fish). Try to limit salt intake by eating fresh or frozen vegetables (instead of canned), rinse canned vegetables prior to cooking and do not add any additional salt to meals.

## 2023-09-23 LAB — BASIC METABOLIC PANEL
BUN/Creatinine Ratio: 18 (ref 9–23)
BUN: 13 mg/dL (ref 6–24)
CO2: 25 mmol/L (ref 20–29)
Calcium: 9.9 mg/dL (ref 8.7–10.2)
Chloride: 101 mmol/L (ref 96–106)
Creatinine, Ser: 0.73 mg/dL (ref 0.57–1.00)
Glucose: 131 mg/dL — ABNORMAL HIGH (ref 70–99)
Potassium: 3.9 mmol/L (ref 3.5–5.2)
Sodium: 140 mmol/L (ref 134–144)
eGFR: 95 mL/min/{1.73_m2} (ref >59)

## 2023-09-23 LAB — LIPID PANEL
Chol/HDL Ratio: 3 ratio (ref 0.0–4.4)
Cholesterol, Total: 145 mg/dL (ref 100–199)
HDL: 48 mg/dL (ref >39)
LDL Chol Calc (NIH): 72 mg/dL (ref 0–99)
Triglycerides: 144 mg/dL (ref 0–149)
VLDL Cholesterol Cal: 25 mg/dL (ref 5–40)

## 2023-09-24 LAB — MICROALBUMIN / CREATININE URINE RATIO
Creatinine, Urine: 17.8 mg/dL
Microalb/Creat Ratio: 75 mg/g{creat} — ABNORMAL HIGH (ref 0–29)
Microalbumin, Urine: 13.3 ug/mL

## 2023-09-25 NOTE — Progress Notes (Signed)
 Reviewed and agree with Dr Macky Lower plan.

## 2023-09-28 ENCOUNTER — Encounter: Payer: Self-pay | Admitting: Family Medicine

## 2023-09-29 ENCOUNTER — Other Ambulatory Visit: Payer: Self-pay

## 2023-09-29 MED ORDER — OLMESARTAN MEDOXOMIL-HCTZ 40-12.5 MG PO TABS: 1.0000 | ORAL_TABLET | Freq: Every day | ORAL | 3 refills | Status: AC

## 2023-10-18 ENCOUNTER — Encounter: Payer: Self-pay | Admitting: Pharmacist

## 2023-10-18 ENCOUNTER — Ambulatory Visit (INDEPENDENT_AMBULATORY_CARE_PROVIDER_SITE_OTHER): Payer: Self-pay | Admitting: Pharmacist

## 2023-10-18 VITALS — BP 131/72 | HR 76 | Wt 160.8 lb

## 2023-10-18 DIAGNOSIS — E1165 Type 2 diabetes mellitus with hyperglycemia: Secondary | ICD-10-CM

## 2023-10-18 DIAGNOSIS — E785 Hyperlipidemia, unspecified: Secondary | ICD-10-CM

## 2023-10-18 DIAGNOSIS — E1169 Type 2 diabetes mellitus with other specified complication: Secondary | ICD-10-CM

## 2023-10-18 DIAGNOSIS — I152 Hypertension secondary to endocrine disorders: Secondary | ICD-10-CM

## 2023-10-18 DIAGNOSIS — E1159 Type 2 diabetes mellitus with other circulatory complications: Secondary | ICD-10-CM

## 2023-10-18 MED ORDER — INSULIN GLARGINE 100 UNIT/ML SOLOSTAR PEN
20.0000 [IU] | PEN_INJECTOR | SUBCUTANEOUS | Status: DC
Start: 2023-10-18 — End: 2023-11-21

## 2023-10-18 MED ORDER — TIRZEPATIDE 2.5 MG/0.5ML ~~LOC~~ SOAJ
2.5000 mg | SUBCUTANEOUS | 0 refills | Status: DC
Start: 2023-10-18 — End: 2023-10-27

## 2023-10-18 MED ORDER — ROSUVASTATIN CALCIUM 40 MG PO TABS: 40.0000 mg | ORAL_TABLET | Freq: Every day | ORAL | 3 refills | Status: DC

## 2023-10-18 NOTE — Assessment & Plan Note (Signed)
 ASCVD risk - primary secondary prevention in patient with diabetes. Last LDL is 72 not at goal of <70 mg/dL. ASCVD risk factors include T2DM, HLD and 10-year ASCVD risk score of 16.8%. High intensity statin indicated.  -Continued rosuvastatin 40 mg - sent refills to pharmacy

## 2023-10-18 NOTE — Progress Notes (Signed)
 Reviewed and agree with Dr Macky Lower plan.

## 2023-10-18 NOTE — Progress Notes (Signed)
 S:     Chief Complaint  Patient presents with   Medication Management    Diabetes follow-up   58 y.o. female who presents for diabetes evaluation, education, and management. Patient arrives in good spirits and presents without any assistance.   Patient was referred and last seen by Primary Care Provider, Dr. Mliss Sax, on 09/22/23.   PMH is significant for DM.  At last visit, increased Lantus (insulin glargine) from 18 units to 24 units daily in the morning.   Current diabetes medications include: insulin glargine (Lantus) 24 units daily Current hypertension medications include: olmesartan-hydrochlorothiazide 40-12.5 mg Current hyperlipidemia medications include: rosuvastatin 40 mg  Patient reports adherence to taking all medications as prescribed.   Do you feel that your medications are working for you? yes Have you been experiencing any side effects to the medications prescribed? no Do you have any problems obtaining medications due to transportation or finances? no Insurance coverage: [employer] (active starting April 1st)  Patient denies hypoglycemic events.  Patient denies nocturia (nighttime urination).  Patient denies neuropathy (nerve pain). Patient reports visual changes. Reports about 1x per week when glucose levels reach 300s. Patient denies self foot exams.   Patient reported dietary habits: Eats 2-3 meals/day Breakfast: boiled eggs, bacon, no carbohydrates Lunch: meat, vegetable Dinner: meat, vegetable, biscuits Snacks: apple, potato chips Drinks: diet soda  Patient-reported exercise habits: walking 3x per week (has stopped recently)  O:   Review of Systems  All other systems reviewed and are negative.   Physical Exam Vitals reviewed.  Pulmonary:     Effort: Pulmonary effort is normal.  Neurological:     Mental Status: She is alert.  Psychiatric:        Mood and Affect: Mood normal.        Behavior: Behavior normal.        Thought  Content: Thought content normal.        Judgment: Judgment normal.     Libre3 CGM Download today 10/18/23 % Time CGM is active: 93% Average Glucose: 187 mg/dL Glucose Management Indicator: 7.8  Glucose Variability: 21.8% (goal <36%) Time in Goal:  - Time in range 70-180: 51% - Time above range: 49% - Time below range: 0% Observed patterns: Borderline between 180-250 mg/dL (slightly above upper range)   Lab Results  Component Value Date   HGBA1C 12.4 (A) 08/15/2023   Vitals:   10/18/23 1023  BP: 131/72  Pulse: 76  SpO2: 100%    Lipid Panel     Component Value Date/Time   CHOL 145 09/22/2023 1608   TRIG 144 09/22/2023 1608   HDL 48 09/22/2023 1608   CHOLHDL 3.0 09/22/2023 1608   CHOLHDL 2.4 04/04/2017 1259   VLDL 27 08/08/2016 1041   LDLCALC 72 09/22/2023 1608   LDLCALC 60 04/04/2017 1259   LDLDIRECT 72 10/29/2021 1622    Clinical Atherosclerotic Cardiovascular Disease (ASCVD): No  The 10-year ASCVD risk score (Arnett DK, et al., 2019) is: 8.9%   Values used to calculate the score:     Age: 34 years     Sex: Female     Is Non-Hispanic African American: Yes     Diabetic: Yes     Tobacco smoker: No     Systolic Blood Pressure: 131 mmHg     Is BP treated: No     HDL Cholesterol: 48 mg/dL     Total Cholesterol: 145 mg/dL   A/P: Diabetes longstanding currently improved. Patient is able to  verbalize appropriate hypoglycemia management plan. Medication adherence appears good. Control is suboptimal despite current DM medication regimen. Patient reports goal weight of 130 lbs. -Decreased dose of basal insulin Lantus (insulin glargine) from 24 units to 20 units daily in the morning. Counseled patient to contact office if blood sugars < 100mg /dl - Start GLP-1 Mounjaro (tirzepatide) 2.5 mg/0.5 mL weekly -Patient educated on purpose, proper use, and potential adverse effects.  -Extensively discussed pathophysiology of diabetes, recommended lifestyle interventions, dietary  effects on blood sugar control.  -Counseled on s/sx of and management of hypoglycemia.  -Next A1c anticipated May 2025.   ASCVD risk - primary secondary prevention in patient with diabetes. Last LDL is 72 not at goal of <70 mg/dL. ASCVD risk factors include T2DM, HLD and 10-year ASCVD risk score of 16.8%. High intensity statin indicated.  -Continued rosuvastatin 40 mg - sent refills to pharmacy  Hypertension longstanding currently controlled. Blood pressure goal of <130/80 mmHg. Medication adherence good. -Continue olmesartan-hydrochlorothiazide (BENICAR HCT) 40-12.5 MG daily   Written patient instructions provided. Patient verbalized understanding of treatment plan.  Total time in face to face counseling 41 minutes.    Follow-up:  Pharmacist Dr. Aynslee Mulhall 11/14/23 PCP clinic visit in PRN Patient seen with Teofilo Fellers, PharmD Candidate and Jeanine Millers, PharmD Candidate.

## 2023-10-18 NOTE — Assessment & Plan Note (Addendum)
 Diabetes longstanding currently improved. Patient is able to verbalize appropriate hypoglycemia management plan. Medication adherence appears good. Control is suboptimal despite current DM medication regimen. Patient reports goal weight of 130 lbs. -Decreased dose of basal insulin Lantus (insulin glargine) from 24 units to 20 units daily in the morning. Counseled patient to contact office if blood sugars < 100mg /dl - Start GLP-1 Mounjaro (tirzepatide) 2.5 mg/0.5 mL weekly -Patient educated on purpose, proper use, and potential adverse effects.  -Extensively discussed pathophysiology of diabetes, recommended lifestyle interventions, dietary effects on blood sugar control.  -Counseled on s/sx of and management of hypoglycemia.

## 2023-10-18 NOTE — Assessment & Plan Note (Signed)
 Hypertension longstanding currently controlled. Blood pressure goal of <130/80 mmHg. Medication adherence good. -Continue olmesartan-hydrochlorothiazide (BENICAR HCT) 40-12.5 MG daily

## 2023-10-18 NOTE — Patient Instructions (Addendum)
 It was nice to see you today!  Your goal blood sugar is 80-130 before eating and less than 180 after eating.  Medication Changes: START Mounjaro (tirzepatide) once weekly.  Decrease Lantus (insulin glargine) from 24 to 20 units once daily in the morning. If you start seeing glucose levels under 100 mg/dL, call your doctor to adjust dose.  Continue all other medication the same.   Keep up the good work with diet and exercise. Aim for a diet full of vegetables, fruit and lean meats (chicken, [place], fish). Try to limit salt intake by eating fresh or frozen vegetables (instead of canned), rinse canned vegetables prior to cooking and do not add any additional salt to meals.

## 2023-10-20 ENCOUNTER — Telehealth: Payer: Self-pay

## 2023-10-20 NOTE — Telephone Encounter (Signed)
 Pharmacy Patient Advocate Encounter   Received notification from CoverMyMeds that prior authorization for MOUNJARO  is required/requested.   Insurance verification completed.   The patient is insured through [facility] .   PA required; PA submitted to above mentioned insurance via CoverMyMeds Key/confirmation #/[id number]. Status is pending

## 2023-10-20 NOTE — Telephone Encounter (Signed)
 Pharmacy Patient Advocate Encounter  Received notification from [facility] that Prior Authorization for MOUNJARO has been APPROVED from 10/20/23 to 10/19/24

## 2023-10-27 ENCOUNTER — Telehealth: Payer: Self-pay

## 2023-10-27 DIAGNOSIS — E1165 Type 2 diabetes mellitus with hyperglycemia: Secondary | ICD-10-CM

## 2023-10-27 MED ORDER — TIRZEPATIDE 2.5 MG/0.5ML ~~LOC~~ SOAJ
2.5000 mg | SUBCUTANEOUS | 0 refills | Status: DC
Start: 2023-10-27 — End: 2023-12-26

## 2023-10-27 NOTE — Telephone Encounter (Signed)
 Patient contacted for follow-up of need for CGM sensors.   Since last contact patient reports her glucose readings have been doing well. She reports that she is eating better.  Review of her CGM data revealed: GMI 7.8 Avg. 189 TIR 52%  Current Medications include: Lantus  24 units daily Has not yet been able to obtain Mounjaro (tirzepatide) but has been told it was approved.  Patient denies any significant medication related side effects.  Medication Plan: -Continue same plan.   TWO CGM sensors (Libre 3) placed at front desk for pick-up.    Total time with patient call and documentation of interaction: 12 minutes.

## 2023-10-27 NOTE — Telephone Encounter (Signed)
 Reviewed and agree with Dr Jori Newer plan. L

## 2023-10-27 NOTE — Telephone Encounter (Signed)
 Patients calls nurse line in regards to (2) medications.   (1) She reports she has not heard anything from the pharmacy in regards to Mounjaro. Medication was approved per pharmacy note. [facility] on [address] has been closed sporadically due to staffing issues. Patient asks this be resent to [facility] on [address]. Will resend.  (2) She reports she has a FU apt with [facility] on 5/13, however she reports she will run out of sensors. She is requesting (2) sensors.   Will forward to Dr. Koval.

## 2023-11-14 ENCOUNTER — Encounter: Payer: Self-pay | Admitting: Pharmacist

## 2023-11-14 ENCOUNTER — Ambulatory Visit: Payer: Self-pay | Admitting: Pharmacist

## 2023-11-14 VITALS — BP 123/75 | HR 86 | Wt 161.2 lb

## 2023-11-14 DIAGNOSIS — E785 Hyperlipidemia, unspecified: Secondary | ICD-10-CM

## 2023-11-14 DIAGNOSIS — E1165 Type 2 diabetes mellitus with hyperglycemia: Secondary | ICD-10-CM

## 2023-11-14 DIAGNOSIS — E1169 Type 2 diabetes mellitus with other specified complication: Secondary | ICD-10-CM

## 2023-11-14 DIAGNOSIS — E1159 Type 2 diabetes mellitus with other circulatory complications: Secondary | ICD-10-CM

## 2023-11-14 DIAGNOSIS — I152 Hypertension secondary to endocrine disorders: Secondary | ICD-10-CM

## 2023-11-14 LAB — POCT GLYCOSYLATED HEMOGLOBIN (HGB A1C): HbA1c, POC (controlled diabetic range): 8.5 % — AB (ref 0.0–7.0)

## 2023-11-14 NOTE — Assessment & Plan Note (Signed)
 LIBERATE Study:  - 3 month visit.  Diabetes longstanding currently well-controlled and improved on A1C today - now at 8.5. Patient is able to verbalize appropriate hypoglycemia management plan. Medication adherence appears good.  -Continued basal insulin  Lantus  (insulin  glargine). Counseled patient to contact office if blood sugars <100 mg/dL -Continued GLP-1 Mounjaro (tirzepatide) to 2.5 mg/0.5 mL weekly.  Discussed option of increasing dose but deferred dose increase at this time due to mild nausea. -Patient educated on purpose, proper use, and potential adverse effects.  -Extensively discussed pathophysiology of diabetes, recommended lifestyle interventions, dietary effects on blood sugar control.  -Counseled on s/sx of and management of hypoglycemia.

## 2023-11-14 NOTE — Assessment & Plan Note (Signed)
 Hypertension longstanding currently controlled. Blood pressure goal of <130/80 mmHg. Medication adherence good. -Continued olmesartan -hydrochlorothiazide 40-12.5 mg daily

## 2023-11-14 NOTE — Assessment & Plan Note (Signed)
 ASCVD risk - primary prevention in patient with diabetes. Last LDL is 72 (10/2021) not at goal of <70 mg/dL. ASCVD risk factors include T2DM, HLD, HTN and 10-year ASCVD risk score of 8.9%. high intensity statin indicated. Noted patient has not taken rosuvastatin  the past week due to running out of medications.  -Continued rosuvastatin  40 mg. Encouraged patient to pick up medication refills from home pharmacy.   -Recheck Lipid panel and if remains > goal consider additional agent.

## 2023-11-14 NOTE — Progress Notes (Signed)
 S:     Chief Complaint  Patient presents with   Research    Librate 65-month follow-up   58 y.o. female who presents for diabetes evaluation, education, and management in the context of the LIBERATE Study.  Today, patient arrives in good spirits and presents without any assistance.  Patient was referred and last seen by Primary Care Provider, Dr. Bernardino Bridge, on 09/22/23.  PMH is significant for DM, HTN, HLD. At the last pharmacy visit on 10/18/23, patient's insulin  was decreased to Lantus  20 units daily and patient was started on Mounjaro (tirzepatide).  Current diabetes medications include: insulin  glargine (Lantus ) 20 units daily, Mounjaro (tirzepatide) 2.5 mg Current hypertension medications include: olmesartan -hydrochlorothiazide 40-12.5 mg Current hyperlipidemia medications include: rosuvastatin  40 mg  Patient reports adherence to taking all medications as prescribed, except for the rosuvastatin  which she ran out of about a week ago.  Do you feel that your medications are working for you? yes Have you been experiencing any side effects to the medications prescribed? Yes - reports mild nausea with Mounjaro (tirzepatide) Do you have any problems obtaining medications due to transportation or finances? no Insurance coverage: [employer]  Patient denies hypoglycemic events. She reports her lowest recent blood glucose reading being 105 mg/dL.   Patient-reported exercise habits: Patient plans to being walking outside during her lunch break to three times per week.  O:   Review of Systems  All other systems reviewed and are negative.   Physical Exam Constitutional:      Appearance: Normal appearance.  Pulmonary:     Effort: Pulmonary effort is normal.  Neurological:     Mental Status: She is alert.  Psychiatric:        Mood and Affect: Mood normal.        Behavior: Behavior normal.    Libre3 CGM Download today, 11/14/23 % Time CGM is active: 95% Average Glucose:  141 mg/dL Glucose Management Indicator: 6.7%  Glucose Variability: 15.4% (goal <36%) Time in Goal:  - Time in range 70-180: 95% - Time above range: 5% - Time below range: 0%  Lab Results  Component Value Date   HGBA1C 8.5 (A) 11/14/2023    Vitals:   11/14/23 0838  BP: 123/75  Pulse: 86  SpO2: 99%     Lipid Panel     Component Value Date/Time   CHOL 145 09/22/2023 1608   TRIG 144 09/22/2023 1608   HDL 48 09/22/2023 1608   CHOLHDL 3.0 09/22/2023 1608   CHOLHDL 2.4 04/04/2017 1259   VLDL 27 08/08/2016 1041   LDLCALC 72 09/22/2023 1608   LDLCALC 60 04/04/2017 1259   LDLDIRECT 72 10/29/2021 1622    Clinical Atherosclerotic Cardiovascular Disease (ASCVD): No  The 10-year ASCVD risk score (Arnett DK, et al., 2019) is: 7.4%   Values used to calculate the score:     Age: 42 years     Sex: Female     Is Non-Hispanic African American: Yes     Diabetic: Yes     Tobacco smoker: No     Systolic Blood Pressure: 123 mmHg     Is BP treated: No     HDL Cholesterol: 48 mg/dL     Total Cholesterol: 145 mg/dL   A/P:  LIBERATE Study:  - 8 sensors provided for a 3 month supply. Educated to contact the office if the sensor falls off early and replacements are needed before their next [employer].   Diabetes longstanding currently well-controlled and improved  on A1C today - now at 8.5. Patient is able to verbalize appropriate hypoglycemia management plan. Medication adherence appears good.  -Continued basal insulin  Lantus  (insulin  glargine). Counseled patient to contact office if blood sugars <100 mg/dL -Continued GLP-1 Mounjaro (tirzepatide) to 2.5 mg/0.5 mL weekly.  Discussed option of increasing dose but deferred dose increase at this time due to mild nausea. -Patient educated on purpose, proper use, and potential adverse effects.  -Extensively discussed pathophysiology of diabetes, recommended lifestyle interventions, dietary effects on blood sugar control.  -Counseled on  s/sx of and management of hypoglycemia.  -Next A1c anticipated August 2025.   ASCVD risk - primary prevention in patient with diabetes. Last LDL is 72 (10/2021) not at goal of <70 mg/dL. ASCVD risk factors include T2DM, HLD, HTN and 10-year ASCVD risk score of 8.9%. high intensity statin indicated. Noted patient has not taken rosuvastatin  the past week due to running out of medications.  -Continued rosuvastatin  40 mg. Encouraged patient to pick up medication refills from home pharmacy.   -Recheck Lipid panel and if remains > goal consider additional agent.  Hypertension longstanding currently controlled. Blood pressure goal of <130/80 mmHg. Medication adherence good. -Continued olmesartan -hydrochlorothiazide 40-12.5 mg daily  Written patient instructions provided. Patient verbalized understanding of treatment plan.  Total time in face to face counseling 23 minutes.    Follow-up:  Pharmacist 12/26/23. PCP clinic visit in TBD.  Patient seen with Noah [place]az[place], PharmD Candidate and Volney Grumbles, PharmD, PGY-1 pharmacy resident.Aaron Aas

## 2023-11-14 NOTE — Patient Instructions (Addendum)
 It was nice to see you today!  Your goal blood sugar is 80-130 before eating and less than 180 after eating.  Medication Changes: CONTINUE Mounjaro (tirzepatide) 2.5 mg subcutaneous every week on Mondays  CONTINUE Lantus  (insulin  glargine) 20 units daily in the morning  Contact Dr. Chloris Marcoux at [facility] if blood sugars begin to drop low [phone])  Refill Crestor  (rosuvastatin ) at home pharmacy  Continue all other medication the same.  Keep up the good work with diet and exercise. Aim for a diet full of vegetables, fruit and lean meats (chicken, [place], fish). Try to limit salt intake by eating fresh or frozen vegetables (instead of canned), rinse canned vegetables prior to cooking and do not add any additional salt to meals.

## 2023-11-15 NOTE — Progress Notes (Signed)
 Reviewed and agree with Dr Macky Lower plan.

## 2023-11-21 ENCOUNTER — Telehealth: Payer: Self-pay | Admitting: Pharmacist

## 2023-11-21 DIAGNOSIS — E1165 Type 2 diabetes mellitus with hyperglycemia: Secondary | ICD-10-CM

## 2023-11-21 MED ORDER — INSULIN GLARGINE 100 UNIT/ML SOLOSTAR PEN: 16.0000 [IU] | PEN_INJECTOR | SUBCUTANEOUS | Status: AC

## 2023-11-21 NOTE — Telephone Encounter (Signed)
 Patient contacts office to request call back regarding glucose control.   Patient contacted for follow-up of request on Monday 5/19  Since last contact patient reports she experienced an episode of low glucose and was concerned about lows.   She was unsure if she should continue Mounjaro (tirzepatide) or change insulin  dose.   Following discussion, we agreed to continue tirzepatide at 2.5mg  weekly on Tuesday.  Denies any side effects of concern.  We agreed to decrease Lantus  (insulin  glargine) from 20 to 16 units once daily and continue monitoring for readings less than 70 or symptoms of low glucose.    F/U visit planned: 12/31/2023 for glucose reevaluation.  Asked to contact office sooner if readings Less than 80 persist.     Total time with patient call and documentation of interaction: 13 minutes.

## 2023-12-22 ENCOUNTER — Other Ambulatory Visit (HOSPITAL_COMMUNITY): Payer: Self-pay

## 2023-12-25 ENCOUNTER — Telehealth: Payer: Self-pay

## 2023-12-25 NOTE — Telephone Encounter (Signed)
 Pharmacy Patient Advocate Encounter   Received notification from CoverMyMeds that prior authorization for MOUNJARO  2.5MG  is required/requested.   Insurance verification completed.   The patient is insured through [facility] .   PA required; PA submitted to above mentioned insurance via CoverMyMeds Key/confirmation #/EOC [employer]. Status is pending

## 2023-12-25 NOTE — Telephone Encounter (Signed)
 Pharmacy Patient Advocate Encounter  Received notification from [facility] that Prior Authorization for MOUNJARO  2.5MG  has been APPROVED from 12/25/23 to 03/24/24   PA #/Case ID/Reference #: [id number]

## 2023-12-26 ENCOUNTER — Encounter: Payer: Self-pay | Admitting: Pharmacist

## 2023-12-26 ENCOUNTER — Ambulatory Visit: Payer: Self-pay | Admitting: Pharmacist

## 2023-12-26 VITALS — BP 107/61 | HR 80 | Wt 157.6 lb

## 2023-12-26 DIAGNOSIS — E1165 Type 2 diabetes mellitus with hyperglycemia: Secondary | ICD-10-CM

## 2023-12-26 MED ORDER — TIRZEPATIDE 2.5 MG/0.5ML ~~LOC~~ SOAJ: 2.5000 mg | SUBCUTANEOUS | Status: DC

## 2023-12-26 NOTE — Patient Instructions (Signed)
 It was nice to see you today!  You are doing GREAT with your glucose control.   Your goal blood sugar is 80-130 before eating and less than 180 after eating.  Medication Changes: Continue all other medication the same.   Keep up the good work with diet and exercise. Aim for a diet full of vegetables, fruit and lean meats (chicken, [place]awi, fish). Try to limit salt intake by eating fresh or frozen vegetables (instead of canned), rinse canned vegetables prior to cooking and do not add any additional salt to meals.   Next Appointment with Dr. Tasheba Henson in August - please call to schedule  [phone]

## 2023-12-26 NOTE — Progress Notes (Signed)
 S:     Chief Complaint  Patient presents with   Medication Management    Diabetes - Cheryl Carroll    58 y.o. female who presents for diabetes evaluation, education, and management.  She is also in the [employer].  Patient arrives in good spirits and presents without any assistance.   Patient was referred and last seen by Primary Care Provider, Dr. Lafe, on 09/22/23.  PMH is significant for DM, HTN, HLD.    Current diabetes medications include: insulin  glargine (Lantus ) 16 units daily, Mounjaro  (tirzepatide ) 2.5 mg Current hypertension medications include: olmesartan -hydrochlorothiazide 40-12.5 mg Current hyperlipidemia medications include: rosuvastatin  40 mg  Patient reports adherence to taking all medications as prescribed.  She is concerned her Mounjaro  (tirzepatide ) is delayed in being refilled.  We discussed that a Prior Authorization is in process (after the visit I was able to determine the PA was approved).  Do you feel that your medications are working for you? yes Have you been experiencing any side effects to the medications prescribed? no Patient reports single episode of hypoglycemic events with glucose reaching 69 and alarm on her phone woke her.  She managed the low glucose reading appropriately.  She denies any symptoms of feeling low at the time of the 69 reading.   We discussed that readings in the high 60s may be appropriately good control but in most patients taking insulin  we are concerned at that level.     O:   Review of Systems  Constitutional:  Positive for weight loss (4 lbs since last visit).  All other systems reviewed and are negative.   Physical Exam Vitals reviewed.  Constitutional:      Appearance: Normal appearance.  Pulmonary:     Effort: Pulmonary effort is normal.   Neurological:     Mental Status: She is alert.   Psychiatric:        Mood and Affect: Mood normal.        Behavior: Behavior normal.        Thought Content: Thought content  normal.        Judgment: Judgment normal.    Libre3 CGM Download today 6/25 % Time CGM is active: 96% Average Glucose: 168 mg/dL Glucose Management Indicator: 7.3  Glucose Variability: 21.5% (goal <36%) Time in Goal:  - Time in range 70-180: 70% - Time above range: 30% only 3% > 250 - Time below range: 0% Observed patterns:  Overall good control.   Lab Results  Component Value Date   HGBA1C 8.5 (A) 11/14/2023   Vitals:   12/26/23 0837  BP: 107/61  Pulse: 80  SpO2: 97%    Lipid Panel     Component Value Date/Time   CHOL 145 09/22/2023 1608   TRIG 144 09/22/2023 1608   HDL 48 09/22/2023 1608   CHOLHDL 3.0 09/22/2023 1608   CHOLHDL 2.4 04/04/2017 1259   VLDL 27 08/08/2016 1041   LDLCALC 72 09/22/2023 1608   LDLCALC 60 04/04/2017 1259   LDLDIRECT 72 10/29/2021 1622    Clinical Atherosclerotic Cardiovascular Disease (ASCVD): No  The 10-year ASCVD risk score (Arnett DK, et al., 2019) is: 4.8%   Values used to calculate the score:     Age: 63 years     Clincally relevant sex: Female     Is Non-Hispanic African American: Yes     Diabetic: Yes     Tobacco smoker: No     Systolic Blood Pressure: 107 mmHg     Is BP  treated: No     HDL Cholesterol: 48 mg/dL     Total Cholesterol: 145 mg/dL    A/P: Diabetes longstanding currently well-controlled.  Remains in target range with GMI of 7.3 Patient is able to verbalize appropriate hypoglycemia management plan. Medication adherence appears good.  -Continued basal insulin  Lantus  (insulin  glargine) at 16 units once daily. Counseled patient to contact office if blood sugars <100 mg/dL and advised reducing by 2 units if multiple overnight readings less than 80. -Continued GLP-1 Mounjaro  (tirzepatide ) to 2.5 mg weekly - plan to continue with this dose given weight loss (sample provided) - Future visit - consider SGLT2 addition as an option to further reduce insulin  need -Extensively discussed pathophysiology of diabetes,  recommended lifestyle interventions, dietary effects on blood sugar control.  -Counseled on s/sx of and management of hypoglycemia.  -Next A1c anticipated August 2025.    ASCVD risk - primary prevention in patient with diabetes.  -Continued rosuvastatin  40 mg -Next lab draw, recheck Lipid panel and if remains > goal consider additional agent.   Hypertension longstanding currently controlled. Blood pressure goal of <130/80 mmHg. Medication adherence good. -Continued olmesartan -hydrochlorothiazide 40-12.5 mg daily   Written patient instructions provided. Patient verbalized understanding of treatment plan.  Total time in face to face counseling 32 minutes.    Follow-up:  Pharmacist August for End of Liberate 6 month study. PCP clinic visit 7/28

## 2023-12-26 NOTE — Assessment & Plan Note (Signed)
 Diabetes longstanding currently well-controlled.  Remains in target range with GMI of 7.3 Patient is able to verbalize appropriate hypoglycemia management plan. Medication adherence appears good.  -Continued basal insulin  Lantus  (insulin  glargine) at 16 units once daily. Counseled patient to contact office if blood sugars <100 mg/dL and advised reducing by 2 units if multiple overnight readings less than 80. -Continued GLP-1 Mounjaro  (tirzepatide ) to 2.5 mg weekly - plan to continue with this dose given weight loss - Future visit - consider SGLT2 addition as an option to further reduce insulin  need -Extensively discussed pathophysiology of diabetes, recommended lifestyle interventions, dietary effects on blood sugar control.  -Counseled on s/sx of and management of hypoglycemia.  -Next A1c anticipated August 2025

## 2023-12-27 NOTE — Progress Notes (Signed)
 Reviewed and agree with Dr Macky Lower plan.

## 2024-01-02 IMAGING — CR DG CHEST 2V
2 series · 2 of 2 positions shown · non-contrast
Comparison: Chest x-ray 07/23/2021.

CLINICAL DATA: Suspected pneumonia.

EXAM:
CHEST - 2 VIEW

[w chest pa]
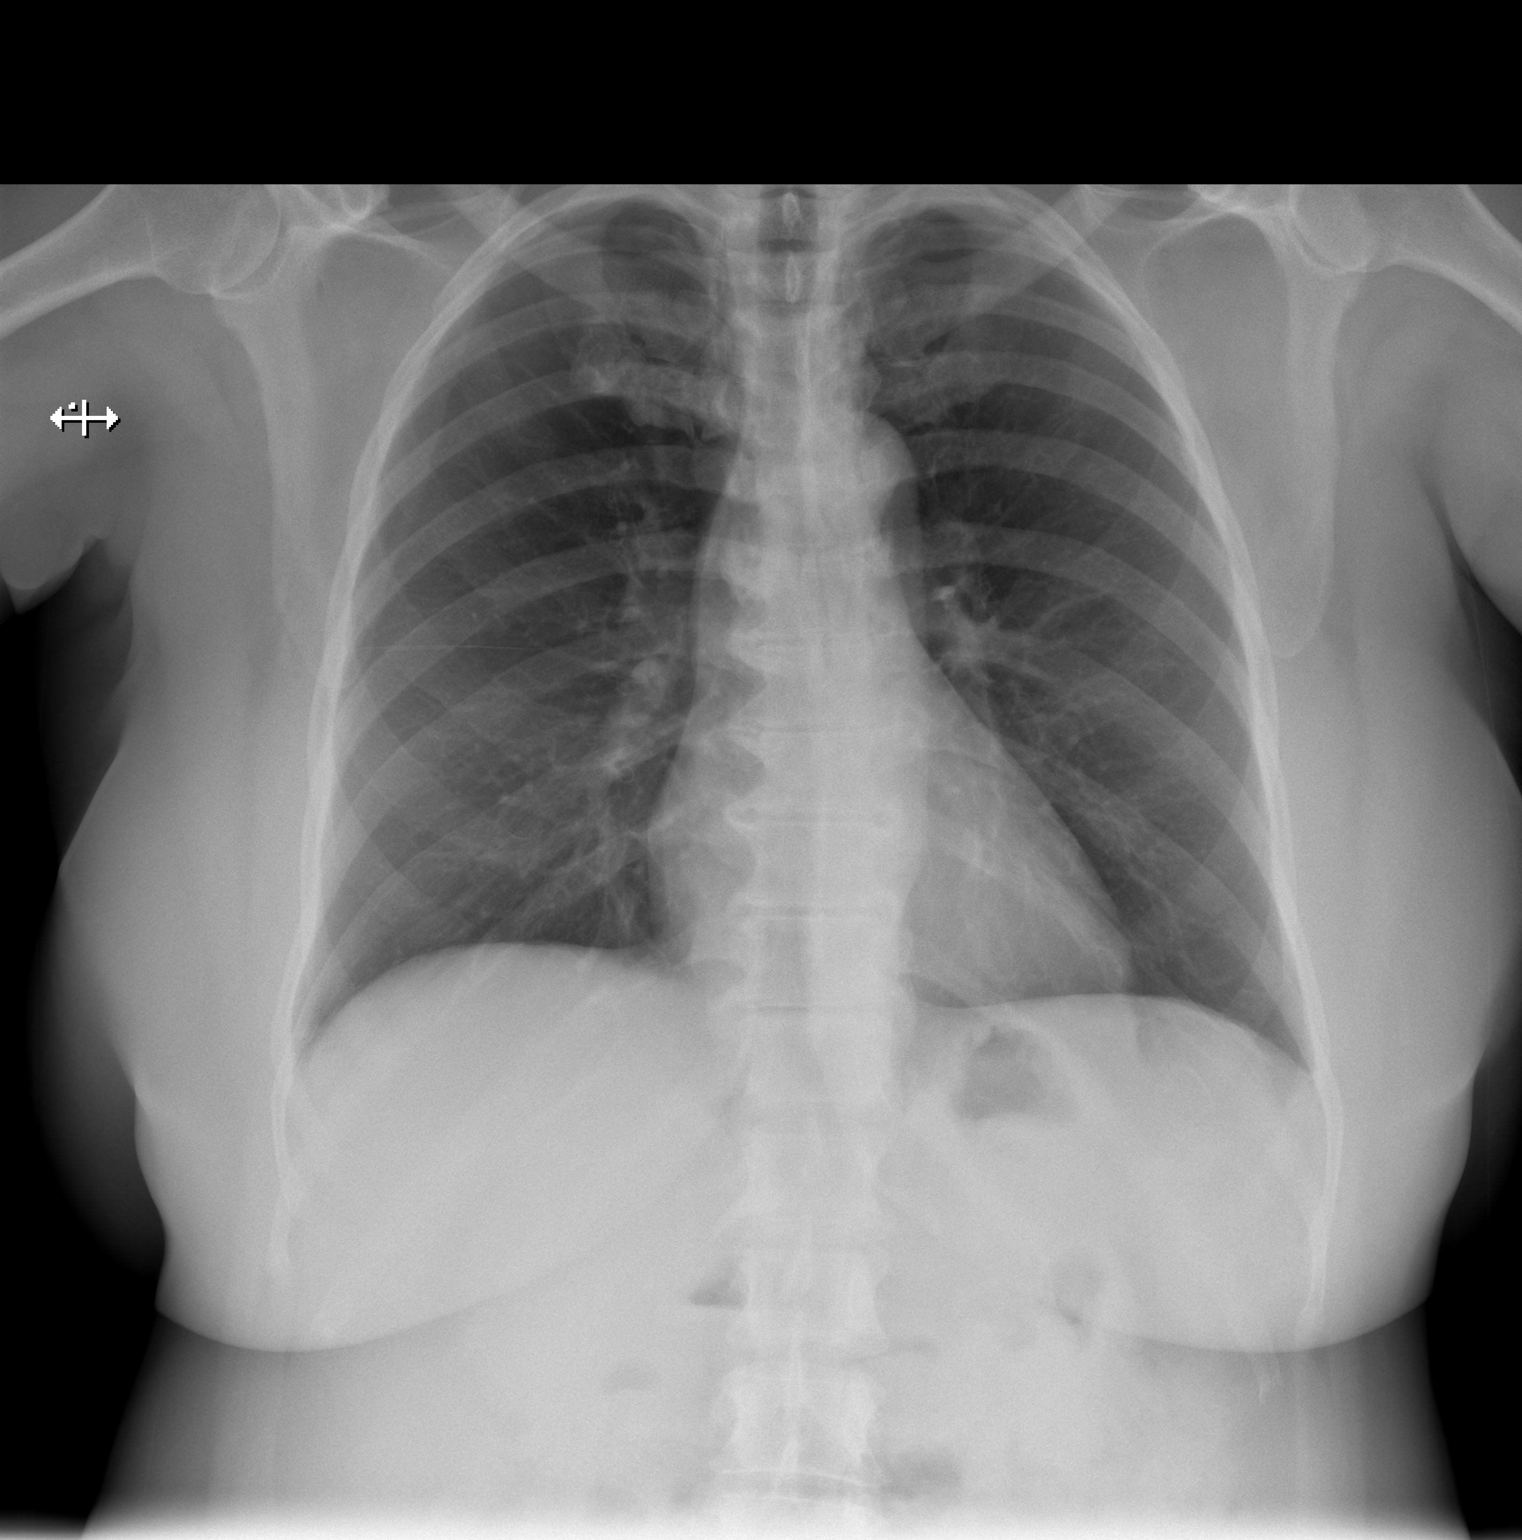

[w chest lat]
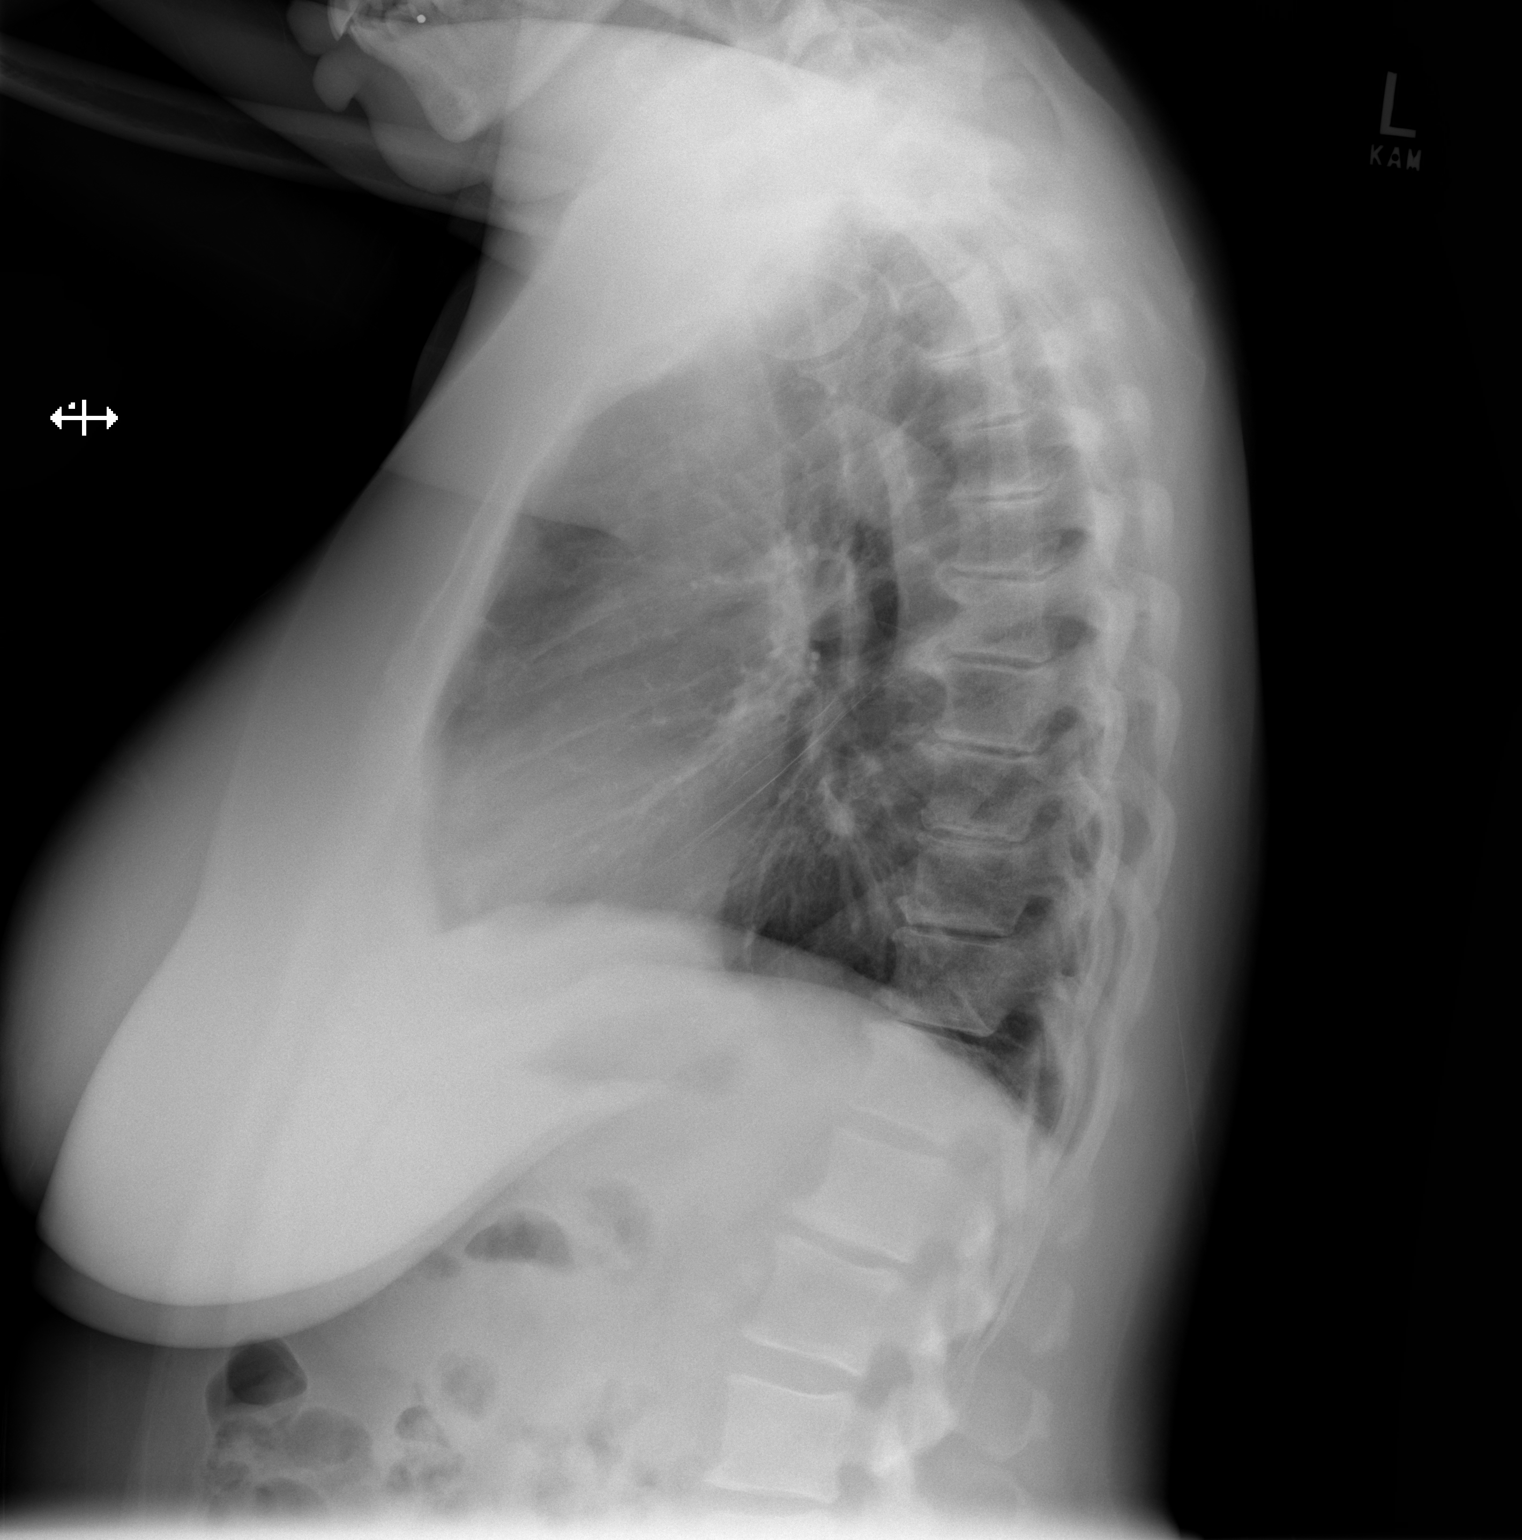

[2 of 2 positions shown; findings below may reference images not displayed]

FINDINGS: The heart size and mediastinal contours are within normal limits.
Both lungs are clear. The visualized skeletal structures are
unremarkable.
IMPRESSION: No active cardiopulmonary disease.

## 2024-01-29 ENCOUNTER — Ambulatory Visit: Admitting: Family Medicine

## 2024-02-14 ENCOUNTER — Ambulatory Visit: Admitting: Pharmacist

## 2024-02-14 ENCOUNTER — Ambulatory Visit: Admitting: Family Medicine

## 2024-02-14 ENCOUNTER — Encounter: Payer: Self-pay | Admitting: Pharmacist

## 2024-02-14 ENCOUNTER — Other Ambulatory Visit (HOSPITAL_COMMUNITY): Payer: Self-pay

## 2024-02-14 VITALS — BP 124/77 | HR 88 | Wt 160.0 lb

## 2024-02-14 DIAGNOSIS — M542 Cervicalgia: Secondary | ICD-10-CM

## 2024-02-14 DIAGNOSIS — E785 Hyperlipidemia, unspecified: Secondary | ICD-10-CM

## 2024-02-14 DIAGNOSIS — E1169 Type 2 diabetes mellitus with other specified complication: Secondary | ICD-10-CM

## 2024-02-14 DIAGNOSIS — Z1231 Encounter for screening mammogram for malignant neoplasm of breast: Secondary | ICD-10-CM

## 2024-02-14 DIAGNOSIS — E1165 Type 2 diabetes mellitus with hyperglycemia: Secondary | ICD-10-CM

## 2024-02-14 DIAGNOSIS — Z0000 Encounter for general adult medical examination without abnormal findings: Secondary | ICD-10-CM

## 2024-02-14 LAB — POCT GLYCOSYLATED HEMOGLOBIN (HGB A1C): HbA1c, POC (controlled diabetic range): 7.6 % — AB (ref 0.0–7.0)

## 2024-02-14 MED ORDER — TIRZEPATIDE 5 MG/0.5ML ~~LOC~~ SOAJ: 5.0000 mg | SUBCUTANEOUS | 2 refills | Status: AC

## 2024-02-14 MED ORDER — FREESTYLE LIBRE 3 PLUS SENSOR MISC: 11 refills | Status: AC

## 2024-02-14 NOTE — Progress Notes (Signed)
 S:     Chief Complaint  Patient presents with   Medication Management    DM-Liberate study 6 mo f/u   58 y.o. female who presents for 6 month liberate study follow-up, diabetes evaluation, education, and management. Patient arrives in  good spirits and presents without any assistance.   Patient was referred and last seen by Primary Care Provider, Dr. Lafe, on 09/22/23.  At last visit on 12/26/23 GMI was 7.3, enrolled in liberate study 08/25/23 with A1c of 12.4.   PMH is significant for diabetes mellitus, hypertension, Hyperlipidemia .   Current diabetes medications include: insulin  glargine (Lantus ) 14 units daily, Mounjaro  (tirzepatide ) 2.5 mg Current hypertension medications include: olmesartan -hydrochlorothiazide 40-12.5 mg Current hyperlipidemia medications include: rosuvastatin  40 mg  Patient reports adherence to taking all medications as prescribed.   Do you feel that your medications are working for you? yes Have you been experiencing any side effects to the medications prescribed? Yes Reported some nausea with Mounjaro  (tirzepatide )  2.5 mg Do you have any problems obtaining medications due to transportation or finances? no   Patient reports hypoglycemic events.   Reported 2 hour post-meal/random blood sugars: 280  Patient denies nocturia (nighttime urination).  Patient denies neuropathy (nerve pain). Patient denies visual changes. Patient denies self foot exams.   Patient reported dietary habits: eating more bread and fried corn nuggets, denies other starches. Reports eating frozen chocolate/strawberry covered bananas as a sweet once a week.   Patient-reported exercise habits: reports walking more recently Reports a weight goal of 140-145 lbs  O:   Review of Systems  All other systems reviewed and are negative.   Physical Exam Constitutional:      Appearance: Normal appearance.  Pulmonary:     Effort: Pulmonary effort is normal.  Neurological:      Mental Status: She is alert.  Psychiatric:        Mood and Affect: Mood normal.        Behavior: Behavior normal.        Thought Content: Thought content normal.        Judgment: Judgment normal.      Libre3 CGM Download today 02/14/24 % Time CGM is active: 95% Average Glucose: 173 mg/dL Glucose Management Indicator: 7.4  Glucose Variability: 17.4% (goal <36%) Time in Goal:  - Time in range 70-180: 69% - Time above range: 31% (only 2% >250 mg/dL) - Time below range: 0% Observed patterns: not much variability, staying around 180 mg/dL   Lab Results  Component Value Date   HGBA1C 7.6 (A) 02/14/2024   Vitals:   02/14/24 1015  BP: 124/77  Pulse: 88  SpO2: 100%    Lipid Panel     Component Value Date/Time   CHOL 145 09/22/2023 1608   TRIG 144 09/22/2023 1608   HDL 48 09/22/2023 1608   CHOLHDL 3.0 09/22/2023 1608   CHOLHDL 2.4 04/04/2017 1259   VLDL 27 08/08/2016 1041   LDLCALC 72 09/22/2023 1608   LDLCALC 60 04/04/2017 1259   LDLDIRECT 72 10/29/2021 1622    Clinical Atherosclerotic Cardiovascular Disease (ASCVD): No  The 10-year ASCVD risk score (Arnett DK, et al., 2019) is: 7.5%   Values used to calculate the score:     Age: 73 years     Clincally relevant sex: Female     Is Non-Hispanic African American: Yes     Diabetic: Yes     Tobacco smoker: No     Systolic Blood Pressure: 124 mmHg  Is BP treated: No     HDL Cholesterol: 48 mg/dL     Total Cholesterol: 145 mg/dL     A/P: 6 month Liberate study visit: Final visit Diabetes longstanding, currently improving with an A1c today of 7.6. Patient is able to verbalize appropriate hypoglycemia management plan. Medication adherence appears good.  -Adjusted dose of basal insulin  Lantus  (insulin  glargine) from 14 units to 12 units daily in the morning. -Adjusted dose of Mounjaro  (tirzepatide ) from 2.5 mg to 5 mg .  -Patient educated on purpose, proper use, and potential adverse effects.  -Extensively discussed  pathophysiology of diabetes, recommended lifestyle interventions, dietary effects on blood sugar control.  -Counseled on s/sx of and management of hypoglycemia.   Get a BMET at next visit  Written patient instructions provided. Patient verbalized understanding of treatment plan.  Total time in face to face counseling 17 minutes.    Follow-up:  PCP clinic visit in 02/14/24 Patient seen with Fonda Blase, PharmD Candidate - PY3 student and Calton Nash, PharmD Candidate - PY4 student.

## 2024-02-14 NOTE — Assessment & Plan Note (Signed)
 Continue per plan outlined by Dr. Koval today: - Lantus  12 units daily in the morning, Mounjaro  5 mg weekly

## 2024-02-14 NOTE — Assessment & Plan Note (Signed)
 Diabetes longstanding, currently improving with an A1c today of 7.6. Patient is able to verbalize appropriate hypoglycemia management plan. Medication adherence appears good.  -Adjusted dose of basal insulin  Lantus  (insulin  glargine) from 14 units to 12 units daily in the morning. -Adjusted dose of Mounjaro  (tirzepatide ) from 2.5 mg to 5 mg .  -Patient educated on purpose, proper use, and potential adverse effects.  -Extensively discussed pathophysiology of diabetes, recommended lifestyle interventions, dietary effects on blood sugar control.  -Counseled on s/sx of and management of hypoglycemia.

## 2024-02-14 NOTE — Patient Instructions (Addendum)
 It was so good to see you today! Thank you for allowing me to take care of you.  Today we discussed the following concerns and plans:  Neck pain - Gentle stretching, heat and ice may help - try a new pillow  - tylenol  650 mg every 8 hours as needed  You are due for a mammogram and a pap smear. Please call to make your mammogram appointment. I strongly encourage you to get your pap smear.  I recommend you get your Shingles vaccine at your pharmacy.  If you have any concerns, please call the clinic or schedule an appointment.  It was a pleasure to take care of you today. Be well!  Lauraine Norse, DO [facility], PGY-2  Do you need your medications delivered to your home?   We'll send your prescription to the [facility] for delivery.          Address: [address] [place], [place], [place] [address]          Phone: [phone]  Please call the [facility] to speak with a pharmacist and set up your home medication delivery. If you have any questions, feel free to contact us  -- we're happy to help!  Other [facility] that offer affordable prices on both prescriptions and over-the-counter items, as well as convenient services like vaccinations, are  [facility], at [facility]         Address:  [address], [place], [place] [address]         Phone: [phone]  [facility], located in the [facility]        Address: [address], [place], [place] [address]        Phone: [phone]  [facility], at [facility]       Address: [address], [place], [place] [address]       Phone: [phone]  [facility], at [facility]       Address: [address], [place], [place] [address]       Phone: [phone]

## 2024-02-14 NOTE — Progress Notes (Signed)
    SUBJECTIVE:   CHIEF COMPLAINT / HPI:   T2DM Pt saw Dr. Koval today for diabetes management - A1c has improved today to 7.6.  She is due for an ophthalmology exam-she tells me she has an appointment for this in September.  Neck pain Suspects tension from stress - keeps twin grandchildren overnight No HA No vision changes, pain in any other region of the back. No pain in shoulders. 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 She reports neck imaging in 2009 which showed bulging disc.  Due for mammogram, pap smear.  PERTINENT  PMH / PSH:  HTN HLD  OBJECTIVE:   BP 124/77   Pulse 88   Wt 160 lb (72.6 kg)   SpO2 100%   BMI 29.26 kg/m   General: Well-appearing, no acute distress. HEENT: normocephalic, PERRLA, EOM grossly intact, MMM. Neck with intact pain-free ROM, no LAD, some hypertonicity to paraspinal musculature nontender on palpation. Cardio: Regular rate, regular rhythm, no murmurs on exam. Pulm: Clear, no wheezing, no crackles. No increased work of breathing. Abdominal: bowel sounds present, soft, non-tender, non-distended. Extremities: no peripheral edema. Moves all extremities equally. Neuro: Alert and oriented x3, speech normal in content, no facial asymmetry. Psych:  Cognition and judgment appear intact.   ASSESSMENT/PLAN:   Assessment & Plan Uncontrolled diabetes mellitus with hyperglycemia, without long-term current use of insulin  (HCC) Continue per plan outlined by Dr. Koval today: - Lantus  12 units daily in the morning, Mounjaro  5 mg weekly Neck pain, bilateral This may be related to prior bulging disc, however may also be related to tension.  Discussed option of referral back to neurosurgery/orthopedics for further evaluation and management, however patient declines that she is not interested in injections or surgery and feels that they can offer her anything else. - May continue Tylenol  650 mg every 8 hours as needed - Other supportive  care such as heat/ice, gentle stretching - Return precautions given Healthcare maintenance Patient agreeable to mammogram.  She is not interested in having Pap smear, states that she does not need this because she has not been sexually active in a long time. - Mammogram ordered, patient has information to schedule - Strongly encouraged patient to consider Pap smear in the near future; long discussion had about need for screening despite sexual activity - Patient encouraged to get shingles vaccine at pharmacy; long discussion had regarding benefits     Lauraine Norse, DO [facility]

## 2024-02-14 NOTE — Patient Instructions (Addendum)
 It was nice to see you today! Your numbers are great, keep up the good work! Try to work on lowering the amount of bread intake by next visit.  Your goal blood sugar is 80-130 before eating and less than 180 after eating.  Medication Changes:  Increase Mounjaro  (tirzepatide ) to 5 mg  Decrease Lantus  (insulin  glargine) to 12 units from 14 units   Continue all other medication the same.    Keep up the good work with diet and exercise. Aim for a diet full of vegetables, fruit and lean meats (chicken, [place]awi, fish). Try to limit salt intake by eating fresh or frozen vegetables (instead of canned), rinse canned vegetables prior to cooking and do not add any additional salt to meals.

## 2024-02-14 NOTE — Assessment & Plan Note (Signed)
 Patient agreeable to mammogram.  She is not interested in having Pap smear, states that she does not need this because she has not been sexually active in a long time. - Mammogram ordered, patient has information to schedule - Strongly encouraged patient to consider Pap smear in the near future; long discussion had about need for screening despite sexual activity - Patient encouraged to get shingles vaccine at pharmacy; long discussion had regarding benefits

## 2024-02-16 NOTE — Progress Notes (Signed)
 Reviewed and agree with Dr Rennis plan.

## 2024-02-26 ENCOUNTER — Encounter: Admitting: Family Medicine

## 2024-02-26 NOTE — Progress Notes (Deleted)
    SUBJECTIVE:   Chief compliant/HPI: annual examination  Cheryl Carroll is a 58 y.o. who presents today for an annual exam.    History tabs reviewed and updated ***.   Review of systems form reviewed and notable for ***.   OBJECTIVE:   There were no vitals taken for this visit.  General: ***-appearing, no acute distress. HEENT: normocephalic, PERRLA, MMM, bilateral TM visualized without erythema or bulging. Cardio: Regular rate, *** rhythm, no murmurs on exam. Pulm: Clear, no wheezing, no crackles. No increased work of breathing. Abdominal: bowel sounds present, soft, non-tender, non-distended. Extremities: no peripheral edema. Moves all extremities equally. Neuro: Alert and oriented x3, speech normal in content, no facial asymmetry, strength intact and equal bilaterally in UE and LE, pupils equal and reactive to light.  Psych:  Cognition and judgment appear intact. Alert, communicative, and cooperative with normal attention span and concentration. No apparent delusions, illusions, hallucinations    ASSESSMENT/PLAN:   Assessment & Plan    Annual Examination  See AVS for age appropriate recommendations  PHQ score ***, reviewed and discussed.  BP reviewed and at goal ***.  Asked about intimate partner violence and resources given as appropriate  Advance directives discussion ***  Considered the following items based upon USPSTF recommendations: Diabetes screening: Last A1c 2 weeks ago, improved at 7.6 Screening for elevated cholesterol: WNL in Spring 2025 HIV testing: {discussed/ordered:14545} NR 2015 Hepatitis C: {discussed/ordered:14545} Neg 2020 Hepatitis B: {discussed/ordered:14545} Never done Syphilis if at high risk: {discussed/ordered:14545} GC/CT {GC/CT screening :23818} Osteoporosis screening considered based upon risk of fracture from [facility] calculator. Major osteoporotic fracture risk is ***%. DEXA {ordered not order:23822}.  Reviewed risk factors for latent  tuberculosis and not indicated  Cervical cancer screening: discussed and declined.  *** Breast cancer screening: *** Discussed at last visit 2 weeks ago - she has *** scheduled this. Colorectal cancer screening: {crcscreen:23821::discussed, colonoscopy ordered} Lung cancer screening: discussed, not ordered. Patient has no smoking history ***. Vaccinations ***.   Follow up in 1 *** year or sooner if indicated.  MyChart Activation: Already signed up  Lauraine Norse, DO [facility]

## 2024-03-25 ENCOUNTER — Other Ambulatory Visit (HOSPITAL_COMMUNITY): Payer: Self-pay

## 2024-03-25 ENCOUNTER — Telehealth: Payer: Self-pay

## 2024-03-25 NOTE — Telephone Encounter (Signed)
 Rec'd PA request for Mounjaro  5mg   Per insurance ([employer]) no PA required at this time.   Test claim shows next fill 03/28/24, last filled 03/07/24.

## 2024-04-14 ENCOUNTER — Other Ambulatory Visit: Payer: Self-pay | Admitting: Family Medicine

## 2024-04-14 DIAGNOSIS — E1169 Type 2 diabetes mellitus with other specified complication: Secondary | ICD-10-CM
